# Patient Record
Sex: Female | Born: 1950 | Race: Black or African American | Hispanic: No | Smoking: Current every day smoker
Health system: Southern US, Community
[De-identification: ages and names within clinical notes are randomized; demographics above are authoritative.]

## PROBLEM LIST (undated history)

## (undated) DIAGNOSIS — F419 Anxiety disorder, unspecified: Secondary | ICD-10-CM

## (undated) DIAGNOSIS — M6740 Ganglion, unspecified site: Secondary | ICD-10-CM

## (undated) DIAGNOSIS — I509 Heart failure, unspecified: Secondary | ICD-10-CM

## (undated) DIAGNOSIS — K649 Unspecified hemorrhoids: Secondary | ICD-10-CM

## (undated) DIAGNOSIS — D571 Sickle-cell disease without crisis: Secondary | ICD-10-CM

## (undated) DIAGNOSIS — M1990 Unspecified osteoarthritis, unspecified site: Secondary | ICD-10-CM

## (undated) DIAGNOSIS — F329 Major depressive disorder, single episode, unspecified: Secondary | ICD-10-CM

## (undated) DIAGNOSIS — M549 Dorsalgia, unspecified: Secondary | ICD-10-CM

## (undated) DIAGNOSIS — Z9289 Personal history of other medical treatment: Secondary | ICD-10-CM

## (undated) DIAGNOSIS — D126 Benign neoplasm of colon, unspecified: Secondary | ICD-10-CM

## (undated) DIAGNOSIS — I10 Essential (primary) hypertension: Secondary | ICD-10-CM

## (undated) DIAGNOSIS — I2510 Atherosclerotic heart disease of native coronary artery without angina pectoris: Secondary | ICD-10-CM

## (undated) DIAGNOSIS — K08199 Complete loss of teeth due to other specified cause, unspecified class: Secondary | ICD-10-CM

## (undated) DIAGNOSIS — E785 Hyperlipidemia, unspecified: Secondary | ICD-10-CM

## (undated) DIAGNOSIS — Z973 Presence of spectacles and contact lenses: Secondary | ICD-10-CM

## (undated) DIAGNOSIS — F32A Depression, unspecified: Secondary | ICD-10-CM

## (undated) DIAGNOSIS — J9819 Other pulmonary collapse: Secondary | ICD-10-CM

## (undated) DIAGNOSIS — D369 Benign neoplasm, unspecified site: Secondary | ICD-10-CM

## (undated) DIAGNOSIS — H269 Unspecified cataract: Secondary | ICD-10-CM

## (undated) HISTORY — DX: Unspecified hemorrhoids: K64.9

## (undated) HISTORY — PX: OTHER SURGICAL HISTORY: SHX169

## (undated) HISTORY — DX: Benign neoplasm, unspecified site: D36.9

## (undated) HISTORY — DX: Depression, unspecified: F32.A

## (undated) HISTORY — DX: Major depressive disorder, single episode, unspecified: F32.9

## (undated) HISTORY — DX: Sickle-cell disease without crisis: D57.1

## (undated) HISTORY — DX: Unspecified osteoarthritis, unspecified site: M19.90

## (undated) HISTORY — DX: Unspecified cataract: H26.9

## (undated) HISTORY — DX: Hyperlipidemia, unspecified: E78.5

## (undated) HISTORY — DX: Heart failure, unspecified: I50.9

## (undated) HISTORY — DX: Anxiety disorder, unspecified: F41.9

## (undated) HISTORY — PX: BACK SURGERY: SHX140

## (undated) HISTORY — PX: TONSILLECTOMY: SUR1361

## (undated) HISTORY — DX: Benign neoplasm of colon, unspecified: D12.6

## (undated) HISTORY — DX: Essential (primary) hypertension: I10

## (undated) HISTORY — PX: ABDOMINAL HYSTERECTOMY: SHX81

## (undated) HISTORY — PX: LUNG SURGERY: SHX703

---

## 1982-07-12 DIAGNOSIS — J9819 Other pulmonary collapse: Secondary | ICD-10-CM

## 1982-07-12 HISTORY — DX: Other pulmonary collapse: J98.19

## 1985-07-12 HISTORY — PX: COLON SURGERY: SHX602

## 1997-10-09 ENCOUNTER — Encounter: Admission: RE | Admit: 1997-10-09 | Discharge: 1998-01-07 | Payer: Self-pay | Admitting: *Deleted

## 1997-11-01 ENCOUNTER — Encounter: Admission: RE | Admit: 1997-11-01 | Discharge: 1997-11-01 | Payer: Self-pay | Admitting: Family Medicine

## 1997-11-12 ENCOUNTER — Encounter: Admission: RE | Admit: 1997-11-12 | Discharge: 1997-11-12 | Payer: Self-pay | Admitting: Family Medicine

## 1997-11-18 ENCOUNTER — Encounter: Admission: RE | Admit: 1997-11-18 | Discharge: 1997-11-18 | Payer: Self-pay | Admitting: Family Medicine

## 1997-12-12 ENCOUNTER — Ambulatory Visit (HOSPITAL_COMMUNITY): Admission: RE | Admit: 1997-12-12 | Discharge: 1997-12-12 | Payer: Self-pay | Admitting: Neurological Surgery

## 1997-12-16 ENCOUNTER — Encounter: Admission: RE | Admit: 1997-12-16 | Discharge: 1997-12-16 | Payer: Self-pay | Admitting: Family Medicine

## 1998-01-15 ENCOUNTER — Encounter: Admission: RE | Admit: 1998-01-15 | Discharge: 1998-01-15 | Payer: Self-pay | Admitting: Family Medicine

## 1998-01-29 ENCOUNTER — Encounter: Admission: RE | Admit: 1998-01-29 | Discharge: 1998-01-29 | Payer: Self-pay | Admitting: Family Medicine

## 1998-02-03 ENCOUNTER — Encounter: Admission: RE | Admit: 1998-02-03 | Discharge: 1998-02-03 | Payer: Self-pay | Admitting: Family Medicine

## 1998-02-06 ENCOUNTER — Inpatient Hospital Stay (HOSPITAL_COMMUNITY): Admission: RE | Admit: 1998-02-06 | Discharge: 1998-02-08 | Payer: Self-pay | Admitting: Neurological Surgery

## 1998-04-24 ENCOUNTER — Encounter: Admission: RE | Admit: 1998-04-24 | Discharge: 1998-07-23 | Payer: Self-pay | Admitting: Neurological Surgery

## 1998-05-30 ENCOUNTER — Encounter: Payer: Self-pay | Admitting: Neurological Surgery

## 1998-05-30 ENCOUNTER — Ambulatory Visit (HOSPITAL_COMMUNITY): Admission: RE | Admit: 1998-05-30 | Discharge: 1998-05-30 | Payer: Self-pay | Admitting: Neurological Surgery

## 1998-06-02 ENCOUNTER — Encounter: Admission: RE | Admit: 1998-06-02 | Discharge: 1998-06-02 | Payer: Self-pay | Admitting: Family Medicine

## 1998-08-13 ENCOUNTER — Encounter: Admission: RE | Admit: 1998-08-13 | Discharge: 1998-08-13 | Payer: Self-pay | Admitting: Family Medicine

## 1998-08-28 ENCOUNTER — Encounter: Admission: RE | Admit: 1998-08-28 | Discharge: 1998-08-28 | Payer: Self-pay | Admitting: Family Medicine

## 1998-09-01 ENCOUNTER — Encounter: Admission: RE | Admit: 1998-09-01 | Discharge: 1998-09-01 | Payer: Self-pay | Admitting: Family Medicine

## 1998-09-05 ENCOUNTER — Ambulatory Visit (HOSPITAL_COMMUNITY): Admission: RE | Admit: 1998-09-05 | Discharge: 1998-09-05 | Payer: Self-pay | Admitting: Family Medicine

## 1998-09-19 ENCOUNTER — Encounter: Admission: RE | Admit: 1998-09-19 | Discharge: 1998-09-19 | Payer: Self-pay | Admitting: Family Medicine

## 1998-10-02 ENCOUNTER — Ambulatory Visit (HOSPITAL_COMMUNITY): Admission: RE | Admit: 1998-10-02 | Discharge: 1998-10-02 | Payer: Self-pay | Admitting: Cardiology

## 1998-10-29 ENCOUNTER — Encounter: Admission: RE | Admit: 1998-10-29 | Discharge: 1998-10-29 | Payer: Self-pay | Admitting: Family Medicine

## 1998-10-29 ENCOUNTER — Emergency Department (HOSPITAL_COMMUNITY): Admission: EM | Admit: 1998-10-29 | Discharge: 1998-10-29 | Payer: Self-pay | Admitting: Emergency Medicine

## 1998-11-03 ENCOUNTER — Ambulatory Visit (HOSPITAL_COMMUNITY): Admission: RE | Admit: 1998-11-03 | Discharge: 1998-11-03 | Payer: Self-pay | Admitting: Surgery

## 1998-12-09 ENCOUNTER — Encounter: Admission: RE | Admit: 1998-12-09 | Discharge: 1998-12-09 | Payer: Self-pay | Admitting: Family Medicine

## 1998-12-30 ENCOUNTER — Encounter: Admission: RE | Admit: 1998-12-30 | Discharge: 1998-12-30 | Payer: Self-pay | Admitting: Family Medicine

## 1999-03-24 ENCOUNTER — Encounter: Admission: RE | Admit: 1999-03-24 | Discharge: 1999-03-24 | Payer: Self-pay | Admitting: Family Medicine

## 1999-03-26 ENCOUNTER — Encounter: Admission: RE | Admit: 1999-03-26 | Discharge: 1999-03-26 | Payer: Self-pay | Admitting: Family Medicine

## 1999-04-02 ENCOUNTER — Encounter: Admission: RE | Admit: 1999-04-02 | Discharge: 1999-04-02 | Payer: Self-pay | Admitting: Family Medicine

## 1999-04-09 ENCOUNTER — Encounter: Admission: RE | Admit: 1999-04-09 | Discharge: 1999-04-09 | Payer: Self-pay | Admitting: Family Medicine

## 1999-04-16 ENCOUNTER — Encounter: Admission: RE | Admit: 1999-04-16 | Discharge: 1999-04-16 | Payer: Self-pay | Admitting: Family Medicine

## 1999-04-20 ENCOUNTER — Encounter: Admission: RE | Admit: 1999-04-20 | Discharge: 1999-04-20 | Payer: Self-pay | Admitting: Family Medicine

## 1999-04-30 ENCOUNTER — Encounter: Admission: RE | Admit: 1999-04-30 | Discharge: 1999-04-30 | Payer: Self-pay | Admitting: Family Medicine

## 1999-05-08 ENCOUNTER — Encounter: Admission: RE | Admit: 1999-05-08 | Discharge: 1999-05-08 | Payer: Self-pay | Admitting: Sports Medicine

## 1999-05-14 ENCOUNTER — Encounter: Admission: RE | Admit: 1999-05-14 | Discharge: 1999-05-14 | Payer: Self-pay | Admitting: Family Medicine

## 1999-05-21 ENCOUNTER — Encounter: Admission: RE | Admit: 1999-05-21 | Discharge: 1999-05-21 | Payer: Self-pay | Admitting: Family Medicine

## 1999-06-23 ENCOUNTER — Encounter: Admission: RE | Admit: 1999-06-23 | Discharge: 1999-06-23 | Payer: Self-pay | Admitting: Sports Medicine

## 1999-06-25 ENCOUNTER — Encounter: Admission: RE | Admit: 1999-06-25 | Discharge: 1999-06-25 | Payer: Self-pay | Admitting: Family Medicine

## 1999-07-02 ENCOUNTER — Encounter: Admission: RE | Admit: 1999-07-02 | Discharge: 1999-07-02 | Payer: Self-pay | Admitting: Family Medicine

## 1999-07-16 ENCOUNTER — Encounter: Admission: RE | Admit: 1999-07-16 | Discharge: 1999-07-16 | Payer: Self-pay | Admitting: Family Medicine

## 1999-07-30 ENCOUNTER — Encounter: Admission: RE | Admit: 1999-07-30 | Discharge: 1999-07-30 | Payer: Self-pay | Admitting: Family Medicine

## 1999-08-06 ENCOUNTER — Encounter: Admission: RE | Admit: 1999-08-06 | Discharge: 1999-08-06 | Payer: Self-pay | Admitting: Family Medicine

## 1999-08-13 ENCOUNTER — Encounter: Admission: RE | Admit: 1999-08-13 | Discharge: 1999-08-13 | Payer: Self-pay | Admitting: Family Medicine

## 1999-08-19 ENCOUNTER — Encounter: Admission: RE | Admit: 1999-08-19 | Discharge: 1999-08-19 | Payer: Self-pay | Admitting: Family Medicine

## 1999-08-20 ENCOUNTER — Encounter: Admission: RE | Admit: 1999-08-20 | Discharge: 1999-08-20 | Payer: Self-pay | Admitting: Family Medicine

## 1999-08-24 ENCOUNTER — Encounter: Admission: RE | Admit: 1999-08-24 | Discharge: 1999-08-24 | Payer: Self-pay | Admitting: Family Medicine

## 1999-08-27 ENCOUNTER — Encounter: Admission: RE | Admit: 1999-08-27 | Discharge: 1999-08-27 | Payer: Self-pay | Admitting: Family Medicine

## 1999-09-23 ENCOUNTER — Encounter: Admission: RE | Admit: 1999-09-23 | Discharge: 1999-09-23 | Payer: Self-pay | Admitting: Family Medicine

## 1999-09-24 ENCOUNTER — Encounter: Admission: RE | Admit: 1999-09-24 | Discharge: 1999-09-24 | Payer: Self-pay | Admitting: Family Medicine

## 1999-10-08 ENCOUNTER — Encounter: Admission: RE | Admit: 1999-10-08 | Discharge: 1999-10-08 | Payer: Self-pay | Admitting: Family Medicine

## 1999-10-28 ENCOUNTER — Encounter: Admission: RE | Admit: 1999-10-28 | Discharge: 1999-10-28 | Payer: Self-pay | Admitting: Family Medicine

## 1999-10-29 ENCOUNTER — Encounter: Admission: RE | Admit: 1999-10-29 | Discharge: 1999-10-29 | Payer: Self-pay | Admitting: Family Medicine

## 1999-11-09 ENCOUNTER — Encounter: Admission: RE | Admit: 1999-11-09 | Discharge: 1999-11-09 | Payer: Self-pay | Admitting: Family Medicine

## 1999-11-12 ENCOUNTER — Encounter: Admission: RE | Admit: 1999-11-12 | Discharge: 1999-11-12 | Payer: Self-pay | Admitting: Family Medicine

## 1999-11-26 ENCOUNTER — Encounter: Admission: RE | Admit: 1999-11-26 | Discharge: 1999-11-26 | Payer: Self-pay | Admitting: Family Medicine

## 1999-12-10 ENCOUNTER — Encounter: Admission: RE | Admit: 1999-12-10 | Discharge: 1999-12-10 | Payer: Self-pay | Admitting: Family Medicine

## 1999-12-22 ENCOUNTER — Encounter: Admission: RE | Admit: 1999-12-22 | Discharge: 1999-12-22 | Payer: Self-pay | Admitting: Sports Medicine

## 2000-07-13 ENCOUNTER — Encounter: Admission: RE | Admit: 2000-07-13 | Discharge: 2000-07-13 | Payer: Self-pay | Admitting: Family Medicine

## 2000-07-27 ENCOUNTER — Encounter: Admission: RE | Admit: 2000-07-27 | Discharge: 2000-07-27 | Payer: Self-pay | Admitting: Family Medicine

## 2000-08-11 ENCOUNTER — Encounter: Admission: RE | Admit: 2000-08-11 | Discharge: 2000-08-11 | Payer: Self-pay | Admitting: Family Medicine

## 2000-08-16 ENCOUNTER — Encounter: Payer: Self-pay | Admitting: Emergency Medicine

## 2000-08-16 ENCOUNTER — Emergency Department (HOSPITAL_COMMUNITY): Admission: EM | Admit: 2000-08-16 | Discharge: 2000-08-16 | Payer: Self-pay | Admitting: Emergency Medicine

## 2000-08-30 ENCOUNTER — Encounter: Admission: RE | Admit: 2000-08-30 | Discharge: 2000-08-30 | Payer: Self-pay | Admitting: Family Medicine

## 2001-01-09 ENCOUNTER — Encounter: Admission: RE | Admit: 2001-01-09 | Discharge: 2001-01-09 | Payer: Self-pay | Admitting: Family Medicine

## 2001-02-09 ENCOUNTER — Encounter: Admission: RE | Admit: 2001-02-09 | Discharge: 2001-02-09 | Payer: Self-pay | Admitting: Sports Medicine

## 2001-02-09 ENCOUNTER — Encounter: Payer: Self-pay | Admitting: Sports Medicine

## 2001-02-09 ENCOUNTER — Encounter (INDEPENDENT_AMBULATORY_CARE_PROVIDER_SITE_OTHER): Payer: Self-pay | Admitting: *Deleted

## 2001-02-09 LAB — CONVERTED CEMR LAB

## 2001-02-13 ENCOUNTER — Encounter: Admission: RE | Admit: 2001-02-13 | Discharge: 2001-02-13 | Payer: Self-pay | Admitting: Sports Medicine

## 2001-02-24 ENCOUNTER — Encounter: Admission: RE | Admit: 2001-02-24 | Discharge: 2001-02-24 | Payer: Self-pay | Admitting: Family Medicine

## 2001-07-18 ENCOUNTER — Encounter: Admission: RE | Admit: 2001-07-18 | Discharge: 2001-07-18 | Payer: Self-pay | Admitting: Family Medicine

## 2002-02-14 ENCOUNTER — Encounter: Admission: RE | Admit: 2002-02-14 | Discharge: 2002-02-14 | Payer: Self-pay | Admitting: Family Medicine

## 2002-05-01 ENCOUNTER — Encounter: Admission: RE | Admit: 2002-05-01 | Discharge: 2002-05-01 | Payer: Self-pay | Admitting: Family Medicine

## 2002-05-10 ENCOUNTER — Encounter: Payer: Self-pay | Admitting: Sports Medicine

## 2002-05-10 ENCOUNTER — Encounter: Admission: RE | Admit: 2002-05-10 | Discharge: 2002-05-10 | Payer: Self-pay | Admitting: Sports Medicine

## 2002-09-19 ENCOUNTER — Encounter: Admission: RE | Admit: 2002-09-19 | Discharge: 2002-09-19 | Payer: Self-pay | Admitting: Family Medicine

## 2003-03-04 ENCOUNTER — Encounter: Admission: RE | Admit: 2003-03-04 | Discharge: 2003-03-04 | Payer: Self-pay | Admitting: Sports Medicine

## 2003-03-20 ENCOUNTER — Ambulatory Visit (HOSPITAL_COMMUNITY): Admission: RE | Admit: 2003-03-20 | Discharge: 2003-03-20 | Payer: Self-pay | Admitting: Neurological Surgery

## 2003-03-20 ENCOUNTER — Encounter: Payer: Self-pay | Admitting: Neurological Surgery

## 2003-05-08 ENCOUNTER — Encounter: Admission: RE | Admit: 2003-05-08 | Discharge: 2003-05-08 | Payer: Self-pay | Admitting: Family Medicine

## 2003-07-13 HISTORY — PX: CORONARY ANGIOPLASTY: SHX604

## 2003-09-19 ENCOUNTER — Inpatient Hospital Stay (HOSPITAL_COMMUNITY): Admission: EM | Admit: 2003-09-19 | Discharge: 2003-09-21 | Payer: Self-pay | Admitting: Emergency Medicine

## 2003-09-20 ENCOUNTER — Encounter (INDEPENDENT_AMBULATORY_CARE_PROVIDER_SITE_OTHER): Payer: Self-pay | Admitting: Cardiology

## 2003-09-25 ENCOUNTER — Encounter: Admission: RE | Admit: 2003-09-25 | Discharge: 2003-09-25 | Payer: Self-pay | Admitting: Family Medicine

## 2003-11-15 ENCOUNTER — Encounter: Admission: RE | Admit: 2003-11-15 | Discharge: 2003-11-15 | Payer: Self-pay | Admitting: Family Medicine

## 2003-11-18 ENCOUNTER — Encounter: Admission: RE | Admit: 2003-11-18 | Discharge: 2003-11-18 | Payer: Self-pay | Admitting: Family Medicine

## 2003-12-03 ENCOUNTER — Encounter: Admission: RE | Admit: 2003-12-03 | Discharge: 2003-12-03 | Payer: Self-pay | Admitting: Sports Medicine

## 2003-12-11 ENCOUNTER — Encounter: Admission: RE | Admit: 2003-12-11 | Discharge: 2003-12-11 | Payer: Self-pay | Admitting: Sports Medicine

## 2004-05-13 ENCOUNTER — Ambulatory Visit: Payer: Self-pay | Admitting: Family Medicine

## 2004-05-19 ENCOUNTER — Emergency Department (HOSPITAL_COMMUNITY): Admission: EM | Admit: 2004-05-19 | Discharge: 2004-05-20 | Payer: Self-pay | Admitting: Emergency Medicine

## 2004-05-19 ENCOUNTER — Ambulatory Visit: Payer: Self-pay | Admitting: Family Medicine

## 2004-06-18 ENCOUNTER — Ambulatory Visit (HOSPITAL_COMMUNITY): Admission: RE | Admit: 2004-06-18 | Discharge: 2004-06-18 | Payer: Self-pay | Admitting: Surgery

## 2004-12-03 ENCOUNTER — Encounter: Admission: RE | Admit: 2004-12-03 | Discharge: 2004-12-03 | Payer: Self-pay | Admitting: Sports Medicine

## 2005-04-21 ENCOUNTER — Ambulatory Visit: Payer: Self-pay | Admitting: Family Medicine

## 2005-08-25 ENCOUNTER — Ambulatory Visit: Payer: Self-pay | Admitting: Family Medicine

## 2005-08-25 ENCOUNTER — Inpatient Hospital Stay (HOSPITAL_COMMUNITY): Admission: EM | Admit: 2005-08-25 | Discharge: 2005-08-27 | Payer: Self-pay | Admitting: Emergency Medicine

## 2005-09-02 ENCOUNTER — Ambulatory Visit: Payer: Self-pay | Admitting: Family Medicine

## 2005-11-03 ENCOUNTER — Ambulatory Visit: Payer: Self-pay | Admitting: Family Medicine

## 2006-01-11 ENCOUNTER — Encounter: Admission: RE | Admit: 2006-01-11 | Discharge: 2006-01-11 | Payer: Self-pay | Admitting: Sports Medicine

## 2006-01-21 ENCOUNTER — Encounter: Admission: RE | Admit: 2006-01-21 | Discharge: 2006-01-21 | Payer: Self-pay | Admitting: Sports Medicine

## 2006-02-16 ENCOUNTER — Ambulatory Visit: Payer: Self-pay | Admitting: Family Medicine

## 2006-02-28 ENCOUNTER — Ambulatory Visit: Payer: Self-pay | Admitting: Family Medicine

## 2006-04-20 ENCOUNTER — Ambulatory Visit: Payer: Self-pay | Admitting: Family Medicine

## 2006-05-25 ENCOUNTER — Ambulatory Visit: Payer: Self-pay | Admitting: Sports Medicine

## 2006-05-27 ENCOUNTER — Ambulatory Visit: Payer: Self-pay | Admitting: Family Medicine

## 2006-06-21 ENCOUNTER — Ambulatory Visit: Payer: Self-pay | Admitting: Sports Medicine

## 2006-07-15 ENCOUNTER — Encounter (INDEPENDENT_AMBULATORY_CARE_PROVIDER_SITE_OTHER): Payer: Self-pay | Admitting: Family Medicine

## 2006-07-15 ENCOUNTER — Ambulatory Visit: Payer: Self-pay | Admitting: Family Medicine

## 2006-07-15 LAB — CONVERTED CEMR LAB
HDL: 45 mg/dL (ref >39)
LDL Cholesterol: 155 mg/dL — ABNORMAL HIGH (ref 0–99)
Total CHOL/HDL Ratio: 5.6
Triglycerides: 267 mg/dL — ABNORMAL HIGH (ref <150)
VLDL: 53 mg/dL — ABNORMAL HIGH (ref 0–40)

## 2006-07-25 ENCOUNTER — Ambulatory Visit: Payer: Self-pay | Admitting: Family Medicine

## 2006-09-08 DIAGNOSIS — I2510 Atherosclerotic heart disease of native coronary artery without angina pectoris: Secondary | ICD-10-CM | POA: Insufficient documentation

## 2006-09-08 DIAGNOSIS — F3289 Other specified depressive episodes: Secondary | ICD-10-CM | POA: Insufficient documentation

## 2006-09-08 DIAGNOSIS — I10 Essential (primary) hypertension: Secondary | ICD-10-CM

## 2006-09-08 DIAGNOSIS — E785 Hyperlipidemia, unspecified: Secondary | ICD-10-CM | POA: Insufficient documentation

## 2006-09-08 DIAGNOSIS — I5022 Chronic systolic (congestive) heart failure: Secondary | ICD-10-CM

## 2006-09-08 DIAGNOSIS — I2589 Other forms of chronic ischemic heart disease: Secondary | ICD-10-CM

## 2006-09-08 DIAGNOSIS — F329 Major depressive disorder, single episode, unspecified: Secondary | ICD-10-CM

## 2006-09-08 DIAGNOSIS — F411 Generalized anxiety disorder: Secondary | ICD-10-CM

## 2006-09-08 HISTORY — DX: Generalized anxiety disorder: F41.1

## 2006-09-09 ENCOUNTER — Encounter (INDEPENDENT_AMBULATORY_CARE_PROVIDER_SITE_OTHER): Payer: Self-pay | Admitting: *Deleted

## 2006-11-15 ENCOUNTER — Telehealth (INDEPENDENT_AMBULATORY_CARE_PROVIDER_SITE_OTHER): Payer: Self-pay | Admitting: Family Medicine

## 2006-11-17 ENCOUNTER — Encounter (INDEPENDENT_AMBULATORY_CARE_PROVIDER_SITE_OTHER): Payer: Self-pay | Admitting: Family Medicine

## 2006-11-17 ENCOUNTER — Ambulatory Visit: Payer: Self-pay | Admitting: Family Medicine

## 2006-11-17 DIAGNOSIS — Z860100 Personal history of colon polyps, unspecified: Secondary | ICD-10-CM | POA: Insufficient documentation

## 2006-11-17 LAB — CONVERTED CEMR LAB
ALT: 21 units/L (ref 0–35)
AST: 16 units/L (ref 0–37)
Albumin: 4.4 g/dL (ref 3.5–5.2)
CO2: 25 meq/L (ref 19–32)
Chloride: 103 meq/L (ref 96–112)
Creatinine, Ser: 1.22 mg/dL — ABNORMAL HIGH (ref 0.40–1.20)
Glucose, Bld: 89 mg/dL (ref 70–99)
HCT: 36.8 %
Hemoglobin: 12.6 g/dL
Total Bilirubin: 0.4 mg/dL (ref 0.3–1.2)
Total Protein: 7.2 g/dL (ref 6.0–8.3)

## 2007-01-31 ENCOUNTER — Encounter: Admission: RE | Admit: 2007-01-31 | Discharge: 2007-01-31 | Payer: Self-pay | Admitting: Sports Medicine

## 2007-04-03 ENCOUNTER — Telehealth (INDEPENDENT_AMBULATORY_CARE_PROVIDER_SITE_OTHER): Payer: Self-pay | Admitting: *Deleted

## 2007-04-04 ENCOUNTER — Encounter: Payer: Self-pay | Admitting: *Deleted

## 2007-04-04 ENCOUNTER — Ambulatory Visit: Payer: Self-pay | Admitting: Family Medicine

## 2007-04-04 ENCOUNTER — Encounter (INDEPENDENT_AMBULATORY_CARE_PROVIDER_SITE_OTHER): Payer: Self-pay | Admitting: Family Medicine

## 2007-04-04 DIAGNOSIS — Z87891 Personal history of nicotine dependence: Secondary | ICD-10-CM | POA: Insufficient documentation

## 2007-04-04 DIAGNOSIS — F17200 Nicotine dependence, unspecified, uncomplicated: Secondary | ICD-10-CM

## 2007-04-04 LAB — CONVERTED CEMR LAB
Cholesterol, target level: 200 mg/dL
HDL goal, serum: 40 mg/dL

## 2007-04-05 ENCOUNTER — Encounter (INDEPENDENT_AMBULATORY_CARE_PROVIDER_SITE_OTHER): Payer: Self-pay | Admitting: Family Medicine

## 2007-04-05 LAB — CONVERTED CEMR LAB
ALT: 24 units/L (ref 0–35)
Albumin: 4.7 g/dL (ref 3.5–5.2)
BUN: 12 mg/dL (ref 6–23)
CO2: 23 meq/L (ref 19–32)
Calcium: 10.1 mg/dL (ref 8.4–10.5)
Glucose, Bld: 95 mg/dL (ref 70–99)
HCT: 41.3 % (ref 36.0–46.0)
MCHC: 32.9 g/dL (ref 30.0–36.0)
MCV: 84.6 fL (ref 78.0–100.0)
Platelets: 298 10*3/uL (ref 150–400)
Potassium: 4.1 meq/L (ref 3.5–5.3)
RDW: 14.2 % — ABNORMAL HIGH (ref 11.5–14.0)
Total Protein: 7.7 g/dL (ref 6.0–8.3)

## 2007-05-03 ENCOUNTER — Ambulatory Visit: Payer: Self-pay | Admitting: Family Medicine

## 2007-06-03 ENCOUNTER — Encounter: Payer: Self-pay | Admitting: Family Medicine

## 2007-06-03 ENCOUNTER — Ambulatory Visit: Payer: Self-pay | Admitting: Family Medicine

## 2007-06-03 ENCOUNTER — Emergency Department (HOSPITAL_COMMUNITY): Admission: EM | Admit: 2007-06-03 | Discharge: 2007-06-03 | Payer: Self-pay | Admitting: Emergency Medicine

## 2007-06-05 ENCOUNTER — Telehealth: Payer: Self-pay | Admitting: *Deleted

## 2007-06-06 ENCOUNTER — Encounter (INDEPENDENT_AMBULATORY_CARE_PROVIDER_SITE_OTHER): Payer: Self-pay | Admitting: Family Medicine

## 2007-06-06 ENCOUNTER — Ambulatory Visit: Payer: Self-pay | Admitting: Family Medicine

## 2007-06-07 LAB — CONVERTED CEMR LAB
Calcium: 9.3 mg/dL (ref 8.4–10.5)
Glucose, Bld: 85 mg/dL (ref 70–99)
Sodium: 141 meq/L (ref 135–145)

## 2007-07-27 ENCOUNTER — Ambulatory Visit: Payer: Self-pay | Admitting: Family Medicine

## 2007-07-27 ENCOUNTER — Encounter: Payer: Self-pay | Admitting: Family Medicine

## 2007-07-27 LAB — CONVERTED CEMR LAB
AST: 17 units/L (ref 0–37)
BUN: 13 mg/dL (ref 6–23)
Calcium: 9.9 mg/dL (ref 8.4–10.5)
Creatinine, Ser: 1.12 mg/dL (ref 0.40–1.20)
Glucose, Bld: 95 mg/dL (ref 70–99)
LDL Cholesterol: 96 mg/dL (ref 0–99)
LDL Goal: 70 mg/dL
Potassium: 4.1 meq/L (ref 3.5–5.3)
Sodium: 139 meq/L (ref 135–145)
Total Bilirubin: 0.5 mg/dL (ref 0.3–1.2)
Total Protein: 8 g/dL (ref 6.0–8.3)

## 2007-10-10 ENCOUNTER — Encounter: Payer: Self-pay | Admitting: Family Medicine

## 2007-10-10 ENCOUNTER — Ambulatory Visit: Payer: Self-pay | Admitting: Family Medicine

## 2007-10-10 LAB — CONVERTED CEMR LAB: Direct LDL: 90 mg/dL

## 2007-11-09 ENCOUNTER — Telehealth: Payer: Self-pay | Admitting: Family Medicine

## 2007-11-13 ENCOUNTER — Telehealth: Payer: Self-pay | Admitting: Family Medicine

## 2007-12-08 ENCOUNTER — Encounter: Payer: Self-pay | Admitting: Family Medicine

## 2007-12-08 ENCOUNTER — Ambulatory Visit: Payer: Self-pay | Admitting: Family Medicine

## 2007-12-26 ENCOUNTER — Encounter: Payer: Self-pay | Admitting: Family Medicine

## 2008-01-08 ENCOUNTER — Encounter: Payer: Self-pay | Admitting: Family Medicine

## 2008-01-11 ENCOUNTER — Encounter: Payer: Self-pay | Admitting: Family Medicine

## 2008-02-02 ENCOUNTER — Encounter: Admission: RE | Admit: 2008-02-02 | Discharge: 2008-02-02 | Payer: Self-pay | Admitting: Family Medicine

## 2008-03-07 ENCOUNTER — Encounter: Payer: Self-pay | Admitting: Family Medicine

## 2008-03-13 ENCOUNTER — Encounter: Admission: RE | Admit: 2008-03-13 | Discharge: 2008-03-13 | Payer: Self-pay | Admitting: Family Medicine

## 2008-03-26 ENCOUNTER — Ambulatory Visit: Payer: Self-pay | Admitting: Family Medicine

## 2008-04-17 ENCOUNTER — Ambulatory Visit: Payer: Self-pay | Admitting: Family Medicine

## 2008-04-17 ENCOUNTER — Encounter: Payer: Self-pay | Admitting: Family Medicine

## 2008-05-10 IMAGING — MG MM DIAGNOSTIC LTD RIGHT
8 of 10 series · 8 of 10 positions shown · non-contrast
Comparison: none

[REDACTED] RIGHT
CC and MLO view(s) were taken of the right breast.

RIGHT BREAST ULTRASOUND
DIGITAL LIMITED RIGHT DIAGNOSTIC MAMMOGRAM WITH CAD AND RIGHT BREAST ULTRASOUND:
CLINICAL DATA: The patient returns after screening study on 01-11-06 for further evaluation of the 
right breast.

[R CC (1 of 3)]
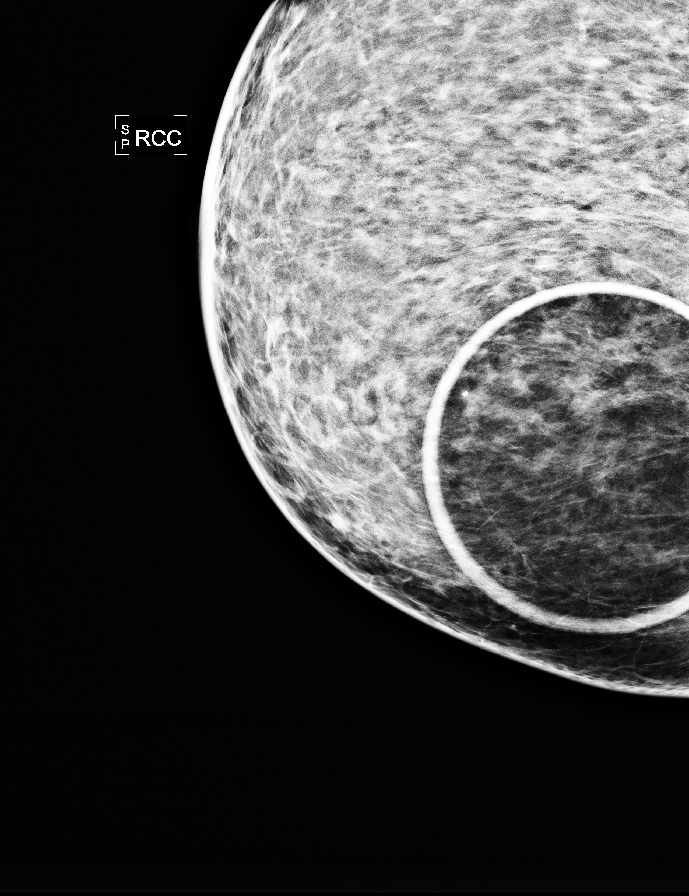

[R RM (1 of 2)]
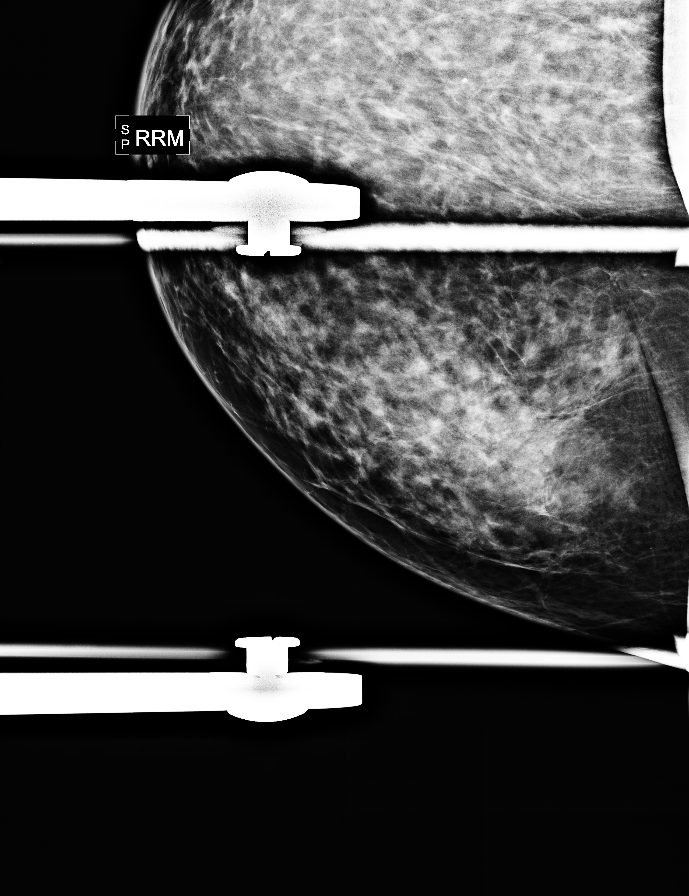

[R CC (2 of 3)]
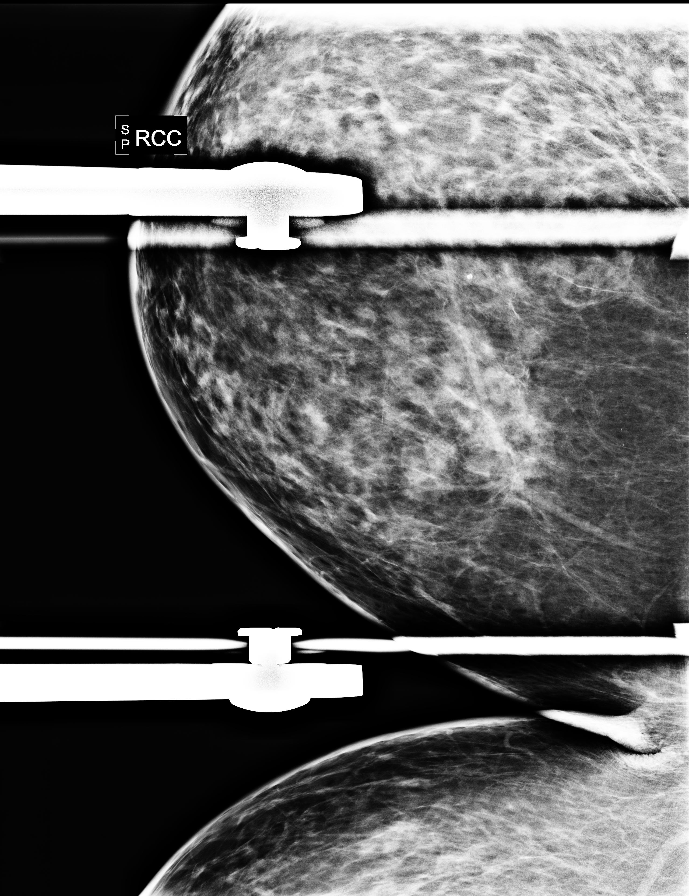

[R MLO]
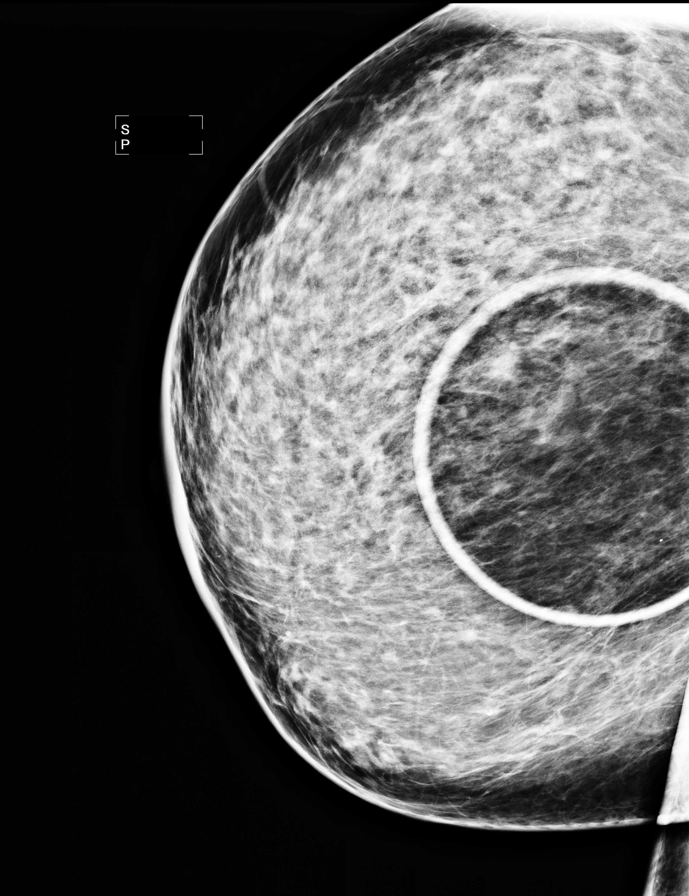

[R CC (3 of 3)]
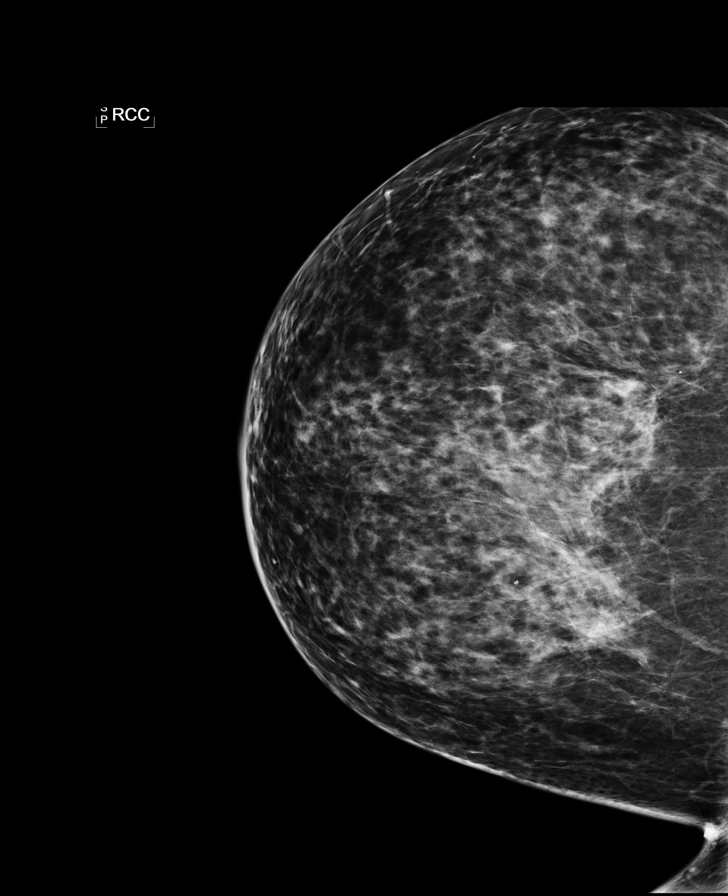

[R right lateral]
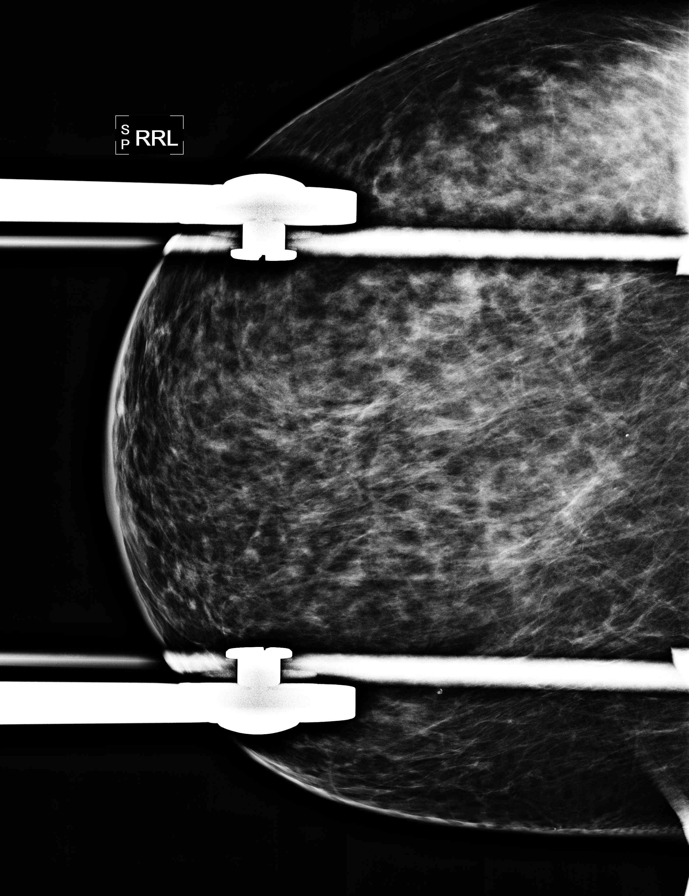

[R ML]
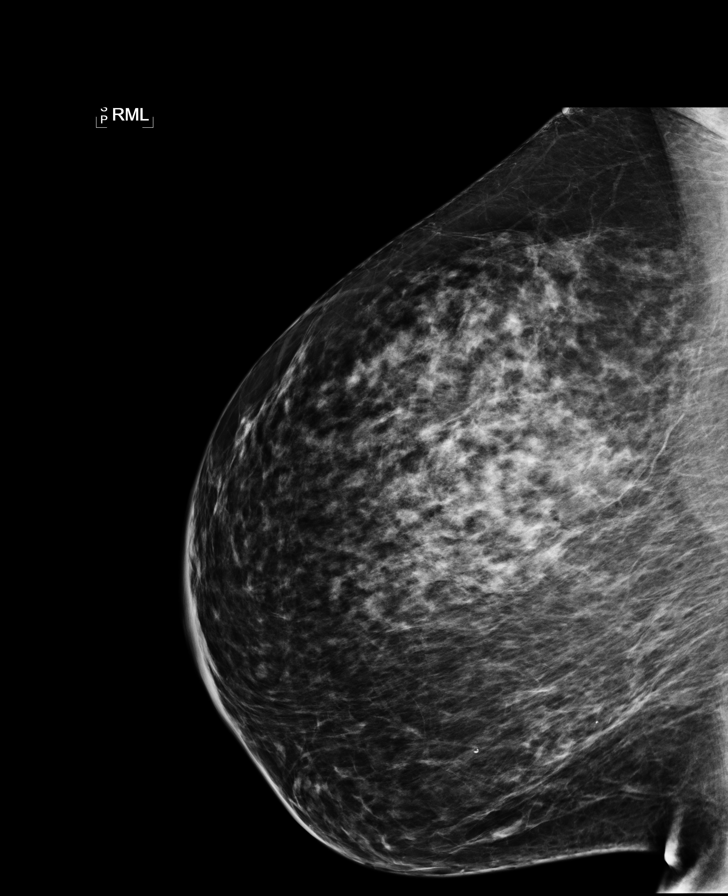

[R RM (2 of 2)]
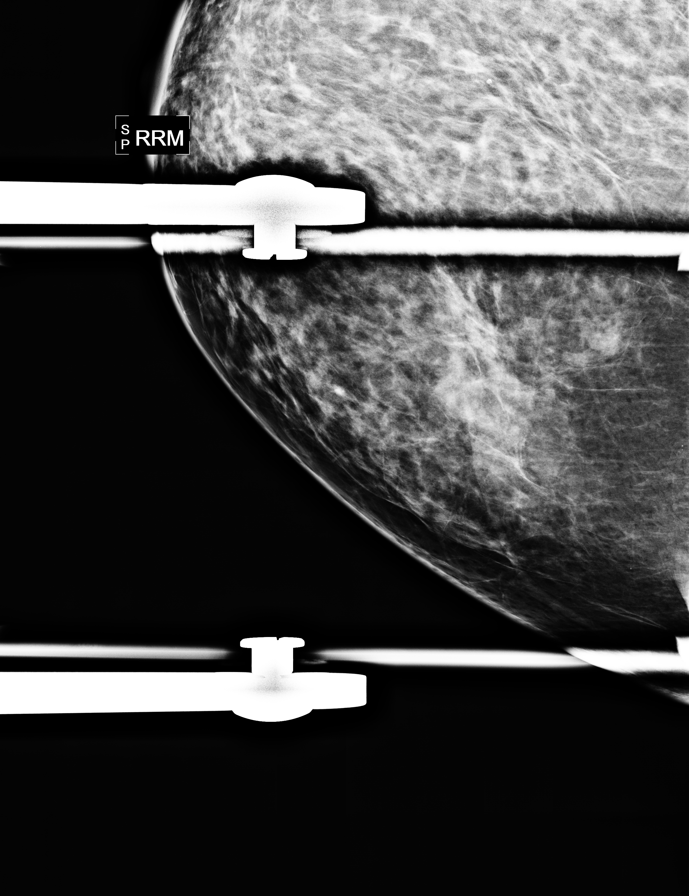

[8 of 10 positions shown; findings below may reference images not displayed]

Spot and rolled views are performed of the right breast, showing asymmetric density in the medial 
portion of the right breast.  However, this density disperses on rolled views and oblique or true 
lateral views.  Comparison to multiple prior exams including 12-03-04, 12-03-03, 12-11-03, 05-10-02, 
and 10-10-98.

On physical exam, I do not palpate an abnormality in the medial portion of the right breast.  
Ultrasound is performed in the medial quadrants showing normal-appearing fibroglandular parenchyma 
without mass or acoustic shadowing.
IMPRESSION: Asymmetric density is consistent with focal fibroglandular tissue.  No evidence for malignancy.  
Annual mammography is recommended.

ASSESSMENT: Negative - BI-RADS 1

Screening mammogram of both breasts in 1 year.
ANALYZED BY COMPUTER AIDED DETECTION. ,

## 2008-07-08 ENCOUNTER — Telehealth: Payer: Self-pay | Admitting: *Deleted

## 2008-07-23 ENCOUNTER — Ambulatory Visit: Payer: Self-pay | Admitting: Family Medicine

## 2008-10-15 ENCOUNTER — Ambulatory Visit: Payer: Self-pay | Admitting: Family Medicine

## 2008-10-15 ENCOUNTER — Encounter: Payer: Self-pay | Admitting: Family Medicine

## 2008-10-15 LAB — CONVERTED CEMR LAB
Albumin: 4.9 g/dL (ref 3.5–5.2)
BUN: 12 mg/dL (ref 6–23)
CO2: 25 meq/L (ref 19–32)
Creatinine, Ser: 1.15 mg/dL (ref 0.40–1.20)
Glucose, Bld: 105 mg/dL — ABNORMAL HIGH (ref 70–99)
HDL: 50 mg/dL (ref >39)
LDL Cholesterol: 95 mg/dL (ref 0–99)
Potassium: 5.2 meq/L (ref 3.5–5.3)
Sodium: 140 meq/L (ref 135–145)
Triglycerides: 182 mg/dL — ABNORMAL HIGH (ref <150)

## 2009-01-15 ENCOUNTER — Ambulatory Visit: Payer: Self-pay | Admitting: Family Medicine

## 2009-01-15 ENCOUNTER — Telehealth: Payer: Self-pay | Admitting: Family Medicine

## 2009-02-05 ENCOUNTER — Encounter: Admission: RE | Admit: 2009-02-05 | Discharge: 2009-02-05 | Payer: Self-pay | Admitting: Family Medicine

## 2009-02-10 ENCOUNTER — Ambulatory Visit: Payer: Self-pay | Admitting: Family Medicine

## 2009-03-25 ENCOUNTER — Encounter: Payer: Self-pay | Admitting: Family Medicine

## 2009-07-15 ENCOUNTER — Encounter: Payer: Self-pay | Admitting: Family Medicine

## 2009-07-16 ENCOUNTER — Encounter: Payer: Self-pay | Admitting: Family Medicine

## 2009-08-15 ENCOUNTER — Encounter: Payer: Self-pay | Admitting: Family Medicine

## 2009-08-15 ENCOUNTER — Ambulatory Visit: Payer: Self-pay | Admitting: Family Medicine

## 2009-08-16 LAB — CONVERTED CEMR LAB
CO2: 26 meq/L (ref 19–32)
Creatinine, Ser: 1.1 mg/dL (ref 0.40–1.20)
Potassium: 4.2 meq/L (ref 3.5–5.3)
Sodium: 139 meq/L (ref 135–145)

## 2009-08-25 ENCOUNTER — Encounter: Payer: Self-pay | Admitting: Family Medicine

## 2009-12-04 ENCOUNTER — Encounter: Payer: Self-pay | Admitting: Family Medicine

## 2009-12-04 DIAGNOSIS — IMO0002 Reserved for concepts with insufficient information to code with codable children: Secondary | ICD-10-CM | POA: Insufficient documentation

## 2009-12-05 ENCOUNTER — Encounter: Payer: Self-pay | Admitting: Family Medicine

## 2009-12-11 ENCOUNTER — Ambulatory Visit: Payer: Self-pay | Admitting: Family Medicine

## 2009-12-11 ENCOUNTER — Encounter: Payer: Self-pay | Admitting: Family Medicine

## 2009-12-26 LAB — CONVERTED CEMR LAB
AST: 18 units/L (ref 0–37)
Albumin: 4.8 g/dL (ref 3.5–5.2)
BUN: 18 mg/dL (ref 6–23)
CO2: 24 meq/L (ref 19–32)
Calcium: 10.2 mg/dL (ref 8.4–10.5)
Creatinine, Ser: 1.17 mg/dL (ref 0.40–1.20)
HDL: 46 mg/dL (ref >39)
LDL Cholesterol: 176 mg/dL — ABNORMAL HIGH (ref 0–99)
Sodium: 138 meq/L (ref 135–145)
Total Bilirubin: 0.4 mg/dL (ref 0.3–1.2)
Total CHOL/HDL Ratio: 5.9
Triglycerides: 239 mg/dL — ABNORMAL HIGH (ref <150)

## 2010-01-23 ENCOUNTER — Ambulatory Visit: Payer: Self-pay | Admitting: Family Medicine

## 2010-02-09 ENCOUNTER — Encounter: Admission: RE | Admit: 2010-02-09 | Discharge: 2010-02-09 | Payer: Self-pay | Admitting: Family Medicine

## 2010-05-01 ENCOUNTER — Encounter: Payer: Self-pay | Admitting: Family Medicine

## 2010-07-01 ENCOUNTER — Encounter: Payer: Self-pay | Admitting: Family Medicine

## 2010-07-01 ENCOUNTER — Ambulatory Visit: Payer: Self-pay | Admitting: Family Medicine

## 2010-07-01 LAB — CONVERTED CEMR LAB
CO2: 27 meq/L (ref 19–32)
Direct LDL: 167 mg/dL — ABNORMAL HIGH
Sodium: 141 meq/L (ref 135–145)
Total Protein: 6.9 g/dL (ref 6.0–8.3)

## 2010-07-07 ENCOUNTER — Encounter: Payer: Self-pay | Admitting: Family Medicine

## 2010-07-14 ENCOUNTER — Encounter: Payer: Self-pay | Admitting: Family Medicine

## 2010-08-02 ENCOUNTER — Encounter: Payer: Self-pay | Admitting: Sports Medicine

## 2010-08-11 NOTE — Assessment & Plan Note (Signed)
Summary: f/u,df   Vital Signs:  Patient profile:   60 year old female Height:      69 inches Weight:      205 pounds BMI:     30.38 Temp:     98.2 degrees F Pulse rate:   85 / minute BP sitting:   123 / 79  Vitals Entered By: Golden Circle RN (August 15, 2009 8:45 AM)   Primary Care Provider:  Bobby Rumpf  MD   History of Present Illness: 1) HTN - Denies chest pain, dyspnea, LE edema, headache. Occasionally checks pressures at pharmacy - 140's / 70's. No side effects from medications. On Bidil, spironolactone, lasix, norvasc, lisinopril, metoprolol. Does not monitor salt. Walks 30 minutes every day. Weight stable.   2) Tobacco abuse - down to 1 cigarette per day by gradually cutting back. Uses SABA once a day before bed. Denies worsening dyspnea, cough, wheeze, fever, hemoptysis, chest pain. Contemplative  about quitting al the way. Has quit before for a max of 5 months, but started up again when she was around smokers.   3) Depression: Mood = "Great ever since you all started me on that medicine". She states that family has noted a change; she is less irritable and sad. Denies SI/HI, or side effects of Paxil.  4) HLD: Last lipid panel in April 2010 - trig 182, total 181, HDL 50, LDL 95. Exercise as above. Decreased fried foods. On crestor w/o side effects.   Habits & Providers  Alcohol-Tobacco-Diet     Alcohol drinks/day: <1     Alcohol Counseling: not indicated; use of alcohol is not excessive or problematic     Alcohol type: beer     Tobacco Status: current     Tobacco Counseling: to quit use of tobacco products     Cigarette Packs/Day: <0.25     Diet Comments: wt loss  Exercise-Depression-Behavior     Have you felt down or hopeless? no     Have you felt little pleasure in things? no     Depression Counseling: not indicated; screening negative for depression     STD Risk: never     Drug Use: never     Seat Belt Use: always     Sun Exposure: infrequent  Current  Medications (verified): 1)  Aspirin Childrens 81 Mg Chew (Aspirin) .... Take 1 Tablet By Mouth Once A Day 2)  Isosorbide Dinitrate 20 Mg Tabs (Isosorbide Dinitrate) .... Take 1 Tablet By Mouth Twice A Day 3)  Lasix 40 Mg Tabs (Furosemide) .... Take 1/2 Tablet By Mouth Twice A Day 4)  Metoprolol Succinate 25 Mg Tb24 (Metoprolol Succinate) .... Take 1 Tablet By Mouth Twice A Day 5)  Norvasc 5 Mg Tabs (Amlodipine Besylate) .... Take 1 Tablet By Mouth Once A Day 6)  Paroxetine Hcl 20 Mg Tabs (Paroxetine Hcl) .... Take 1 Tablet By Mouth Once A Day 7)  Spironolactone 25 Mg Tabs (Spironolactone) .... Take 1/2 Tablet By Mouth Twice A Day 8)  Nasonex 50 Mcg/act  Susp (Mometasone Furoate) .... 2 Sprays in Each Nostril Daily As Needed Congestion 9)  Proventil Hfa 108 (90 Base) Mcg/act  Aers (Albuterol Sulfate) .... 2 Puffs Inhaled Q 2-4 Hours For Wheezing or Shortness of Breath. Disp 1 Inhaler 10)  Crestor 10 Mg Tabs (Rosuvastatin Calcium) .... Take 1 Tab By Mouth Daily 11)  Lisinopril 20 Mg  Tabs (Lisinopril) .Marland Kitchen.. 1 Tab By Mouth Daily. 12)  Zithromax Z-Pak 250 Mg Tabs (Azithromycin) .Marland KitchenMarland KitchenMarland Kitchen  Use As Directed. 13)  Eucerin  Crea (Skin Protectants, Misc.) .... Apply To Hands and Other Dry Areas of Skin Twice A Day. Disp #1tub  Allergies (verified): No Known Drug Allergies  Social History: STD Risk:  never Drug Use:  never Seat Belt Use:  always Sun Exposure-Excessive:  infrequent  Physical Exam  General:  Well appearing woman in NAD, obese BMI = 30 Neck:  no JVD or bruits  Lungs:  Normal respiratory effort, chest expands symmetrically. Lungs are clear to auscultation, no crackles or wheezes. Heart:  Normal rate and regular rhythm. S1 and S2 normal without gallop, murmur, click, rub or other extra sounds. No JVD.  Pulses:  2+ pedals  Extremities:  no edema  Neurologic:  alert & oriented X3 and cranial nerves II-XII intact.     Impression & Recommendations:  Problem # 1:  HYPERTENSION, BENIGN  SYSTEMIC (ICD-401.1) Assessment Unchanged At goal. Will check BMET today. DASH diet, exercise counselling for 25 minutes.  Her updated medication list for this problem includes:    Lasix 40 Mg Tabs (Furosemide) .Marland Kitchen... Take 1/2 tablet by mouth twice a day    Metoprolol Succinate 25 Mg Tb24 (Metoprolol succinate) .Marland Kitchen... Take 1 tablet by mouth twice a day    Norvasc 5 Mg Tabs (Amlodipine besylate) .Marland Kitchen... Take 1 tablet by mouth once a day    Spironolactone 25 Mg Tabs (Spironolactone) .Marland Kitchen... Take 1/2 tablet by mouth twice a day    Lisinopril 20 Mg Tabs (Lisinopril) .Marland Kitchen... 1 tab by mouth daily.  Orders: Basic Met-[facility] ([address]) [facility]- Est  Level 4 ([address])  Problem # 2:  TOBACCO ABUSE (ICD-305.1) Assessment: Unchanged  Actiely quitting but stuck at 1 cigarette per day. Reviewed healthy distractors from smoking. Advised spacing out cigarettes. Will follow. Quit goal w/in next three months.   Orders: [facility]- Est  Level 4 ([address])  Problem # 3:  HYPERLIPIDEMIA (ICD-272.4) Assessment: Unchanged  Recheck FLP in 3 months, CMET. At goal for HDL, LDL. On Crestor.  Her updated medication list for this problem includes:    Crestor 10 Mg Tabs (Rosuvastatin calcium) .Marland Kitchen... Take 1 tab by mouth daily  Labs Reviewed: SGOT: 17 (10/15/2008)   SGPT: 21 (10/15/2008)  Lipid Goals: Chol Goal: 200 (04/04/2007)   HDL Goal: 40 (04/04/2007)   LDL Goal: 70 (07/27/2007)   TG Goal: 150 (04/04/2007)  Prior 10 Yr Risk Heart Disease: N/A (04/04/2007)   HDL:50 (10/15/2008), 47 (07/27/2007)  LDL:95 (10/15/2008), 96 (07/27/2007)  Chol:181 (10/15/2008), 177 (07/27/2007)  Trig:182 (10/15/2008), 170 (07/27/2007)  Orders: [facility]- Est  Level 4 ([address])  Problem # 4:  DEPRESSIVE DISORDER, NOS (ICD-311) Assessment: Unchanged  Stable. Continue Paxil. Reinforced positive behaviors.  Her updated medication list for this problem includes:    Paroxetine Hcl 20 Mg Tabs (Paroxetine hcl) .Marland Kitchen... Take 1 tablet by mouth once a  day  Orders: [facility]- Est  Level 4 ([address]86)  Complete Medication List: 1)  Aspirin Childrens 81 Mg Chew (Aspirin) .... Take 1 tablet by mouth once a day 2)  Isosorbide Dinitrate 20 Mg Tabs (Isosorbide dinitrate) .... Take 1 tablet by mouth twice a day 3)  Lasix 40 Mg Tabs (Furosemide) .... Take 1/2 tablet by mouth twice a day 4)  Metoprolol Succinate 25 Mg Tb24 (Metoprolol succinate) .... Take 1 tablet by mouth twice a day 5)  Norvasc 5 Mg Tabs (Amlodipine besylate) .... Take 1 tablet by mouth once a day 6)  Paroxetine Hcl 20 Mg Tabs (Paroxetine hcl) .... Take 1 tablet by  mouth once a day 7)  Spironolactone 25 Mg Tabs (Spironolactone) .... Take 1/2 tablet by mouth twice a day 8)  Nasonex 50 Mcg/act Susp (Mometasone furoate) .... 2 sprays in each nostril daily as needed congestion 9)  Proventil Hfa 108 (90 Base) Mcg/act Aers (Albuterol sulfate) .... 2 puffs inhaled q 2-4 hours for wheezing or shortness of breath. disp 1 inhaler 10)  Crestor 10 Mg Tabs (Rosuvastatin calcium) .... Take 1 tab by mouth daily 11)  Lisinopril 20 Mg Tabs (Lisinopril) .Marland Kitchen.. 1 tab by mouth daily. 12)  Zithromax Z-pak 250 Mg Tabs (Azithromycin) .... Use as directed. 13)  Eucerin Crea (Skin protectants, misc.) .... Apply to hands and other dry areas of skin twice a day. disp #1tub  Patient Instructions: 1)  It was great to see you today!  2)  Follow up in three months. 3)  You have done a great job with cutting back on smoking. You are almost there - think of ways to deal with stress that do not involve smoking or eating and put these into place every time you feel like smoking. Try to increase the number of days between cigarettes like we talked about.  4)  Continue all medications as before.  5)  Go to pharmacy once a week and check blood pressures and record numbers.  6)  Keep your salt (sodium) under 2000 mg daily  7)  Use Eucerin cream on hands twice a day to prevent dryness.  Prescriptions: LISINOPRIL 20 MG  TABS  (LISINOPRIL) 1 tab by mouth daily.  #30 Tablet x 5   Entered and Authorized by:   Bobby Rumpf  MD   Signed by:   Bobby Rumpf  MD on 08/15/2009   Method used:   [facility]        [facility] [address] [phone]* (retail)       [address]       [place], [place]  [address]       Ph: [phone] or [phone]       Fax: [phone]   RxID:   [id number] CRESTOR 10 MG TABS (ROSUVASTATIN CALCIUM) Take 1 tab by mouth daily  #30 x 6   Entered and Authorized by:   Bobby Rumpf  MD   Signed by:   Bobby Rumpf  MD on 08/15/2009   Method used:   [facility]        [facility] [address] [phone]* (retail)       [address]       [place], [place]  [address]       Ph: [phone] or [phone]       Fax: [phone]   RxID:   [id number] PROVENTIL HFA 108 (90 BASE) MCG/ACT  AERS (ALBUTEROL SULFATE) 2 puffs inhaled q 2-4 hours for wheezing or shortness of breath. disp 1 inhaler  #1 x 1   Entered and Authorized by:   Bobby Rumpf  MD   Signed by:   Bobby Rumpf  MD on 08/15/2009   Method used:   [facility]        [facility] [address] [phone]* (retail)       [address]       [place], [place]  [address]       Ph: [phone] or [phone]       Fax:  [phone]   RxID:   [id number] NASONEX 50 MCG/ACT  SUSP (MOMETASONE FUROATE) 2 sprays in each nostril daily as needed congestion  #1 x 1   Entered and Authorized by:   Bobby Rumpf  MD   Signed by:   Bobby Rumpf  MD on 08/15/2009   Method used:   [facility]ronically to        [facility] [address] [phone]* (retail)       [address]       [place], [place]  [address]       Ph: [phone] or [phone]       Fax: [phone]   RxID:   [id number] SPIRONOLACTONE 25 MG TABS (SPIRONOLACTONE) Take 1/2 tablet by mouth twice a day  #30 Tablet x 5   Entered and Authorized by:   Bobby Rumpf  MD   Signed by:   Bobby Rumpf  MD  on 08/15/2009   Method used:   [facility]ronically to        [facility] [address] [phone]* (retail)       [address]       [place], [place]  [address]       Ph: [phone] or [phone]       Fax: [phone]   RxID:   [id number] PAROXETINE HCL 20 MG TABS (PAROXETINE HCL) Take 1 tablet by mouth once a day  #30 Tablet x 5   Entered and Authorized by:   Bobby Rumpf  MD   Signed by:   Bobby Rumpf  MD on 08/15/2009   Method used:   [facility]ronically to        [facility] [address] [phone]* (retail)       [address]       [place], [place]  [address]       Ph: [phone] or [phone]       Fax: [phone]   RxID:   [id number] NORVASC 5 MG TABS (AMLODIPINE BESYLATE) Take 1 tablet by mouth once a day  #30 Tablet x 5   Entered and Authorized by:   Bobby Rumpf  MD   Signed by:   Bobby Rumpf  MD on 08/15/2009   Method used:   Electronically to        [facility] [address] [phone]* (retail)       [address]       [place], [place]  [address]       Ph: [phone] or [phone]       Fax: [phone]   RxID:   [id number] METOPROLOL SUCCINATE 25 MG TB24 (METOPROLOL SUCCINATE) Take 1 tablet by mouth twice a day  #62 Tablet x 5   Entered and Authorized by:   Bobby Rumpf  MD   Signed by:   Bobby Rumpf  MD on 08/15/2009   Method used:   Electronically to        [facility] [address] [phone]* (retail)       [address]       [place], [place]  [address]       Ph: [phone] or [phone]       Fax: [phone]   RxID:   [id number] LASIX 40 MG TABS (FUROSEMIDE) Take 1/2 tablet by mouth  twice a day  #31 Tablet x 5   Entered and Authorized by:   Bobby Rumpf  MD   Signed by:   Bobby Rumpf  MD on 08/15/2009   Method used:   Electronically to        [facility] [address] [phone]* (retail)       [address]       [place], [place]  [address]       Ph:  [phone] or [phone]       Fax: [phone]   RxID:   [id number] ISOSORBIDE DINITRATE 20 MG TABS (ISOSORBIDE DINITRATE) Take 1 tablet by mouth twice a day  #62 Tablet x 5   Entered and Authorized by:   Bobby Rumpf  MD   Signed by:   Bobby Rumpf  MD on 08/15/2009   Method used:   Electronically to        [facility] [address] [phone]* (retail)       [address]       [place], [place]  [address]       Ph: [phone] or [phone]       Fax: [phone]   RxID:   [id number] ASPIRIN CHILDRENS 81 MG CHEW (ASPIRIN) Take 1 tablet by mouth once a day  #30 x 6   Entered and Authorized by:   Bobby Rumpf  MD   Signed by:   Bobby Rumpf  MD on 08/15/2009   Method used:   Electronically to        [facility] [address] [phone]* (retail)       [address]       [place], [place]  [address]       Ph: [phone] or [phone]       Fax: [phone]   RxID:   [id number] EUCERIN  CREA (SKIN PROTECTANTS, MISC.) Apply to hands and other dry areas of skin twice a day. Disp #1tub  #1 x 11   Entered and Authorized by:   Bobby Rumpf  MD   Signed by:   Bobby Rumpf  MD on 08/15/2009   Method used:   Electronically to        [facility] [address] [phone]* (retail)       [address]       [place], [place]  [address]       Ph: [phone] or [phone]       Fax: [phone]   RxID:   [id number]    Prevention & Chronic Care Immunizations   Influenza vaccine: given (Fluvirin - [facility])  (03/25/2009)   Influenza vaccine due: 05/21/2009    Tetanus booster: 07/12/2001: Done.   Tetanus booster due: 07/13/2011    Pneumococcal vaccine: Pneumovax (Medicare)  (07/27/2007)   Pneumococcal vaccine due: None  Colorectal Screening   Hemoccult: Done.  (07/12/2001)   Hemoccult due: Not Indicated    Colonoscopy: normal  (03/06/2008)   Colonoscopy due: 03/06/2018  Other Screening   Pap smear: Done.   (02/09/2001)   Pap smear action/deferral: hysterectomy  (05/03/2007)   Pap smear due: Not Indicated    Mammogram: ASSESSMENT: Negative - BI-RADS 1^MM DIGITAL SCREENING  (02/05/2009)   Mammogram due: 03/13/2009   Smoking status: current  (08/15/2009)   Smoking cessation counseling: yes  (03/26/2008)  Lipids  Total Cholesterol: 181  (10/15/2008)   LDL: 95  (10/15/2008)   LDL Direct: 90  (10/10/2007)   HDL: 50  (10/15/2008)   Triglycerides: 182  (10/15/2008)   Lipid panel due: 11/12/2009    SGOT (AST): 17  (10/15/2008)   SGPT (ALT): 21  (10/15/2008)   Alkaline phosphatase: 88  (10/15/2008)   Total bilirubin: 0.3  (10/15/2008)   Liver panel due: 11/12/2009    Lipid flowsheet reviewed?: Yes   Progress toward LDL goal: At goal  Hypertension   Last Blood Pressure: 123 / 79  (08/15/2009)   Serum creatinine: 1.15  (10/15/2008)   BMP action: Ordered   Serum potassium 5.2  (10/15/2008)   Basic metabolic panel due: 11/12/2009    Hypertension flowsheet reviewed?: Yes   Progress toward BP goal: At goal  Self-Management Support :   Personal Goals (by the next clinic visit) :      Personal blood pressure goal: 140/90  (08/15/2009)     Personal LDL goal: 100  (08/15/2009)    Patient will work on the following items until the next clinic visit to reach self-care goals:     Medications and monitoring: take my medicines every day, check my blood pressure, bring all of my medications to every visit  (08/15/2009)     Eating: drink diet soda or water instead of juice or soda, use fresh or frozen vegetables, eat foods that are low in salt, eat baked foods instead of fried foods  (08/15/2009)     Activity: take a 30 minute walk every day  (08/15/2009)    Hypertension self-management support: Not documented    Hypertension self-management support not done because: Good outcomes  (08/15/2009)    Lipid self-management support: Not documented     Lipid self-management support not done  because: Good outcomes  (08/15/2009)

## 2010-08-11 NOTE — Assessment & Plan Note (Signed)
Summary: f/up,tcb   Vital Signs:  Patient profile:   60 year old female Height:      69 inches Weight:      204.6 pounds BMI:     30.32 Temp:     98.1 degrees F oral Pulse rate:   80 / minute BP sitting:   111 / 65  (left arm) Cuff size:   regular  Vitals Entered By: Garen Grams LPN (December 12, 4538 8:48 AM) CC: f/u   Primary Care Provider:  Bobby Rumpf  MD  CC:  f/u.  History of Present Illness: 1) HTN - Denies chest pain, dyspnea, LE edema, headache.  No side effects from medications. On Bidil, spironolactone, lasix, norvasc, lisinopril, metoprolol. Does not monitor salt. Walks 30 minutes every day. Weight stable. Has eliminated fried foods, increased fresh vegetables, fiber.  2) Tobacco abuse - Still at 1 cigarette per day by gradually cutting back. Uses SABA once a day before bed. Denies worsening dyspnea, cough, wheeze, fever, hemoptysis, chest pain. Contemplative about quitting al the way. Has quit before for a max of 5 months, but started up again when she was around smokers.   3) Depression: Mood = "Great ever since you all started me on that medicine - my son really wanted to thank you as well". She states that family has noted a change; she is less irritable and sad. Denies SI/HI, or side effects of Paxil.  4) HLD: Last lipid panel in April 2010 - trig 182, total 181, HDL 50, LDL 95. Exercise as above. Decreased fried foods. On crestor w/o side effects.   5) Back pain: History of lumbar disk herniation s/p decompression in 1999, subsequent surgery about three or four years. Reports worsening radicular pain bilateral legs L >>> R - "shooting down the back of [her] legs", worsening lumbar pain - relieved by getting up and walking around. Worse with sitting for extended periods of time, lying flat, better with standing and walking. Denies saddle anesthesia, leg weakness or numbness. Was seen by Dr. Danielle Dess in the past but was unable to pay bill - now on disability w/ Medicare.    Current Medications (verified): 1)  Aspirin Childrens 81 Mg Chew (Aspirin) .... Take 1 Tablet By Mouth Once A Day 2)  Isosorbide Dinitrate 20 Mg Tabs (Isosorbide Dinitrate) .... Take 1 Tablet By Mouth Twice A Day 3)  Lasix 40 Mg Tabs (Furosemide) .... Take 1/2 Tablet By Mouth Twice A Day 4)  Metoprolol Succinate 25 Mg Tb24 (Metoprolol Succinate) .... Take 1 Tablet By Mouth Twice A Day 5)  Norvasc 5 Mg Tabs (Amlodipine Besylate) .... Take 1 Tablet By Mouth Once A Day 6)  Paroxetine Hcl 20 Mg Tabs (Paroxetine Hcl) .... Take 1 Tablet By Mouth Once A Day 7)  Spironolactone 25 Mg Tabs (Spironolactone) .... Take 1/2 Tablet By Mouth Twice A Day 8)  Nasonex 50 Mcg/act  Susp (Mometasone Furoate) .... 2 Sprays in Each Nostril Daily As Needed Congestion 9)  Proventil Hfa 108 (90 Base) Mcg/act  Aers (Albuterol Sulfate) .... 2 Puffs Inhaled Q 2-4 Hours For Wheezing or Shortness of Breath. Disp 1 Inhaler 10)  Crestor 10 Mg Tabs (Rosuvastatin Calcium) .... Take 1 Tab By Mouth Daily 11)  Lisinopril 20 Mg  Tabs (Lisinopril) .Marland Kitchen.. 1 Tab By Mouth Daily. 12)  Zithromax Z-Pak 250 Mg Tabs (Azithromycin) .... Use As Directed. 13)  Eucerin  Crea (Skin Protectants, Misc.) .... Apply To Hands and Other Dry Areas of Skin Twice A  Day. Disp #1tub 14)  Cyclobenzaprine Hcl 5 Mg Tabs (Cyclobenzaprine Hcl) .... One Tab By Mouth At Bedtime For Back Pain 15)  Acetaminophen 650 Mg Cr-Tabs (Acetaminophen) .... One Tab By Mouth Three Times A Day As Needed For Back Pain  Allergies (verified): No Known Drug Allergies  Review of Systems       as per HPI o/w negative   Physical Exam  General:  Well appearing woman in NAD, obese BMI = 30 Neck:  no JVD  Lungs:  Normal respiratory effort, chest expands symmetrically. Lungs are clear to auscultation, no crackles or wheezes. Heart:  Normal rate and regular rhythm. S1 and S2 normal without gallop, murmur, click, rub or other extra sounds. No JVD.  Msk:  - positive straight leg  raise on left, negative on right - 5/5 strength bilateral lower extremities with hipflexion, extension, knee flexion extension  - pain at lumbar spine with extremes of flexion, relieved by extension  Pulses:  2+ pedals  Extremities:  no edema  Neurologic:  alert & oriented X3 and cranial nerves II-XII intact.  DTRs symmetrical and normal.     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 Will refer to Neurosurgery for follow up. Some degree of muscle spasm as well - will give trial of flexeril. APAP for pain as well.  Orders: Radiology other (Radiology Other) [facility]- Est  Level 4 (04540)  Problem # 2:  HYPERTENSION, BENIGN SYSTEMIC (ICD-401.1) Assessment: Unchanged  At goal. DASH diet, exercise reviewed.   Her updated medication list for this problem includes:    Lasix 40 Mg Tabs (Furosemide) .Marland Kitchen... Take 1/2 tablet by mouth twice a day    Metoprolol Succinate 25 Mg Tb24 (Metoprolol succinate) .Marland Kitchen... Take 1 tablet by mouth twice a day    Norvasc 5 Mg Tabs (Amlodipine besylate) .Marland Kitchen... Take 1 tablet by mouth once a day    Spironolactone 25 Mg Tabs (Spironolactone) .Marland Kitchen... Take 1/2 tablet by mouth twice a day    Lisinopril 20 Mg Tabs (Lisinopril) .Marland Kitchen... 1 tab by mouth daily.  BP today: 111/65 Prior BP: 123/79 (08/15/2009)  Prior 10 Yr Risk Heart Disease: N/A (04/04/2007)  Labs Reviewed: K+: 4.2 (08/15/2009) Creat: : 1.10 (08/15/2009)   Chol: 181 (10/15/2008)   HDL: 50 (10/15/2008)   LDL: 95 (10/15/2008)   TG: 182 (10/15/2008)  Problem # 3:  DEPRESSIVE DISORDER, NOS (ICD-311)  Stable. Controlled. Continue Paxil. Reinforced positive behaviors.  Her updated medication list for this problem includes:    Paroxetine Hcl 20 Mg Tabs (Paroxetine hcl) .Marland Kitchen... Take 1 tablet by mouth once a day  Problem # 4:  TOBACCO ABUSE (ICD-305.1) Assessment:  Unchanged  Actiely quitting but stuck at 1 cigarette per day. Reviewed healthy distractors from smoking. Advised spacing out cigarettes. Will follow. Quit goal w/in next three months.   Orders: [facility]- Est  Level 4 (98119)  Problem # 5:  HYPERLIPIDEMIA (ICD-272.4) Assessment: Unchanged Check labs today. Next visit in one month.   Her updated medication list for this problem includes:    Crestor 10 Mg Tabs (Rosuvastatin calcium) .Marland Kitchen... Take 1 tab by mouth daily  Orders: Comp Met-[facility] [phone]) Lipid-[facility] [id number]) [facility]- Est  Level 4 (62952)  Labs Reviewed: SGOT: 17 (10/15/2008)   SGPT: 21 (10/15/2008)  Lipid Goals: Chol Goal: 200 (04/04/2007)   HDL Goal: 40 (04/04/2007)   LDL Goal: 70 (07/27/2007)  TG Goal: 150 (04/04/2007)  Prior 10 Yr Risk Heart Disease: N/A (04/04/2007)   HDL:50 (10/15/2008), 47 (07/27/2007)  LDL:95 (10/15/2008), 96 (07/27/2007)  Chol:181 (10/15/2008), 177 (07/27/2007)  Trig:182 (10/15/2008), 170 (07/27/2007)  Complete Medication List: 1)  Aspirin Childrens 81 Mg Chew (Aspirin) .... Take 1 tablet by mouth once a day 2)  Isosorbide Dinitrate 20 Mg Tabs (Isosorbide dinitrate) .... Take 1 tablet by mouth twice a day 3)  Lasix 40 Mg Tabs (Furosemide) .... Take 1/2 tablet by mouth twice a day 4)  Metoprolol Succinate 25 Mg Tb24 (Metoprolol succinate) .... Take 1 tablet by mouth twice a day 5)  Norvasc 5 Mg Tabs (Amlodipine besylate) .... Take 1 tablet by mouth once a day 6)  Paroxetine Hcl 20 Mg Tabs (Paroxetine hcl) .... Take 1 tablet by mouth once a day 7)  Spironolactone 25 Mg Tabs (Spironolactone) .... Take 1/2 tablet by mouth twice a day 8)  Nasonex 50 Mcg/act Susp (Mometasone furoate) .... 2 sprays in each nostril daily as needed congestion 9)  Proventil Hfa 108 (90 Base) Mcg/act Aers (Albuterol sulfate) .... 2 puffs inhaled q 2-4 hours for wheezing or shortness of breath. disp 1 inhaler 10)  Crestor 10 Mg Tabs (Rosuvastatin calcium) .... Take 1 tab by  mouth daily 11)  Lisinopril 20 Mg Tabs (Lisinopril) .Marland Kitchen.. 1 tab by mouth daily. 12)  Zithromax Z-pak 250 Mg Tabs (Azithromycin) .... Use as directed. 13)  Eucerin Crea (Skin protectants, misc.) .... Apply to hands and other dry areas of skin twice a day. disp #1tub 14)  Cyclobenzaprine Hcl 5 Mg Tabs (Cyclobenzaprine hcl) .... One tab by mouth at bedtime for back pain 15)  Acetaminophen 650 Mg Cr-tabs (Acetaminophen) .... One tab by mouth three times a day as needed for back pain  Patient Instructions: 1)  We will get Xrays of your back.  2)  We will refer you to a neurosurgeon for further evaluation of your back pain. 3)  Follow up with me in one month. 4)  I have prescribed a medication called cyclobenzaprine to take at night to help with your back pain. 5)  I also would like you to take acetaminophen (Tylenol as directed to help with your back pain) 6)  YOU CAN QUIT SMOKING ALL THE WAY! I BELIEVE IN YOU! 7)  It was great to see you today!  Prescriptions: ACETAMINOPHEN 650 MG CR-TABS (ACETAMINOPHEN) one tab by mouth three times a day as needed for back pain  #90 x 3   Entered and Authorized by:   Bobby Rumpf  MD   Signed by:   Bobby Rumpf  MD on 12/11/2009   Method used:   Electronically to        [facility] [facility] [phone]* (retail)       [address]       [place], [place]  [address]       Ph: [phone] or [phone]       Fax: [phone]   RxID:   [id number] CYCLOBENZAPRINE HCL 5 MG TABS (CYCLOBENZAPRINE HCL) one tab by mouth at bedtime for back pain  #30 x 1   Entered and Authorized by:   Bobby Rumpf  MD   Signed by:   Bobby Rumpf  MD on 12/11/2009   Method used:   [facility] to        [facility] [address] [phone]* (retail)       [address]  [place], [place]  [address]       Ph: [phone] or [phone]       Fax: [phone]   RxID:   [id number]    Prevention & Chronic Care Immunizations    Influenza vaccine: given (Fluvirin - [facility])  (03/25/2009)   Influenza vaccine due: 05/21/2009    Tetanus booster: 07/12/2001: Done.   Tetanus booster due: 07/13/2011    Pneumococcal vaccine: Pneumovax (Medicare)  (07/27/2007)   Pneumococcal vaccine due: None  Colorectal Screening   Hemoccult: Done.  (07/12/2001)   Hemoccult due: Not Indicated    Colonoscopy: normal  (03/06/2008)   Colonoscopy due: 03/06/2018  Other Screening   Pap smear: Done.  (02/09/2001)   Pap smear action/deferral: hysterectomy  (05/03/2007)   Pap smear due: Not Indicated    Mammogram: ASSESSMENT: Negative - BI-RADS 1^MM DIGITAL SCREENING  (02/05/2009)   Mammogram due: 03/13/2009   Smoking status: current  (08/15/2009)   Smoking cessation counseling: yes  (03/26/2008)  Lipids   Total Cholesterol: 181  (10/15/2008)   LDL: 95  (10/15/2008)   LDL Direct: 90  (10/10/2007)   HDL: 50  (10/15/2008)   Triglycerides: 182  (10/15/2008)   Lipid panel due: 11/12/2009    SGOT (AST): 17  (10/15/2008)   SGPT (ALT): 21  (10/15/2008) CMP ordered    Alkaline phosphatase: 88  (10/15/2008)   Total bilirubin: 0.3  (10/15/2008)   Liver panel due: 11/12/2009    Lipid flowsheet reviewed?: Yes   Progress toward LDL goal: At goal  Hypertension   Last Blood Pressure: 111 / 65  (12/11/2009)   Serum creatinine: 1.10  (08/15/2009)   BMP action: Ordered   Serum potassium 4.2  (08/15/2009) CMP ordered    Basic metabolic panel due: 11/12/2009    Hypertension flowsheet reviewed?: Yes   Progress toward BP goal: At goal  Self-Management Support :   Personal Goals (by the next clinic visit) :      Personal blood pressure goal: 140/90  (08/15/2009)     Personal LDL goal: 100  (08/15/2009)    Hypertension self-management support: Not documented    Hypertension self-management support not done because: Good outcomes  (12/11/2009)    Lipid self-management support: Not documented     Lipid self-management support  not done because: Good outcomes  (12/11/2009)

## 2010-08-11 NOTE — Miscellaneous (Signed)
Summary: ROI  ROI   Imported By: Clydell Hakim 08/25/2009 12:18:09  _____________________________________________________________________  External Attachment:    Type:   Image     Comment:   External Document

## 2010-08-11 NOTE — Consult Note (Signed)
Summary: [facility] - Transthoracic  Echocardiogram Study  [facility] - Transthoracic  Echocardiogram Study   Imported By: Clydell Hakim 08/26/2009 15:59:51  _____________________________________________________________________  External Attachment:    Type:   Image     Comment:   External Document  Appended Document: [facility] - Transthoracic  Echocardiogram Study Echo w/ mild LVH w/ EF 46% (mildly reduced)

## 2010-08-11 NOTE — Letter (Signed)
Summary: [place] Certificate of Disability  State [place] Certificate of Disability   Imported By: Clydell Hakim 12/08/2009 08:43:51  _____________________________________________________________________  External Attachment:    Type:   Image     Comment:   External Document

## 2010-08-11 NOTE — Consult Note (Signed)
Summary: Cardiology  Cardiology   Imported By: Clydell Hakim 08/26/2009 15:50:54  _____________________________________________________________________  External Attachment:    Type:   Image     Comment:   External Document  Appended Document: Cardiology Reviewed

## 2010-08-11 NOTE — Assessment & Plan Note (Signed)
Summary: f/u,df   Vital Signs:  Patient profile:   60 year old female Height:      69 inches Weight:      204 pounds BMI:     30.23 BSA:     2.08 Temp:     98.0 degrees F Pulse rate:   72 / minute BP sitting:   125 / 73  Vitals Entered By: Jone Baseman CMA (January 23, 2010 8:31 AM) CC: f/u Is Patient Diabetic? No Pain Assessment Patient in pain? no        Primary Care Provider:  Bobby Rumpf  MD  CC:  f/u.  History of Present Illness: 1) HTN - Denies chest pain, dyspnea, LE edema, headache.  No side effects from medications. On Bidil, spironolactone, lasix, norvasc, lisinopril, metoprolol. Still does not monitor salt. Walks 30 minutes every day. Weight stable. Has eliminated fried foods, increased fresh vegetables, fiber.  2) Tobacco abuse - Was at 1 cigarette per day by gradually cutting back. Has increased to 3-4 a day due to increased stress. Uses SABA once a day before bed. Denies worsening dyspnea, cough, wheeze, fever, hemoptysis, chest pain. Contemplative about quitting all the way. Has quit before for a max of 5 months, but started up again when she was around smokers.   3) HLD: Lipid panel at last visit trig 239, HDL 46, LDL 176. She admitted that she had stopped her Crestor (at last visit had said she was taking this) - she restarted this after I gave her her lab results over the phone. Prior lipid panel in April 2010 - trig 182, total 181, HDL 50, LDL 95. Exercise as above. Decreased fried foods. Nw back on crestor w/o side effects.   4) Back pain: Stable since last visit. History of lumbar disk herniation s/p decompression in 1999, subsequent surgery about three or four years. Reports worsening radicular pain bilateral legs L >>> R - "shooting down the back of [her] legs", worsening lumbar pain - relieved by getting up and walking around. Worse with sitting for extended periods of time, lying flat, better with standing and walking. Denies saddle anesthesia, numbness.  History of foot drop since prior to surgery - fell recently due to not wearing her orthotic. Was seen by Dr. Danielle Dess in the past but was unable to pay bill - now on disability w/ Medicare. Was supposed to have films done but "does not want the films because [she] does not want surgery"  Habits & Providers  Alcohol-Tobacco-Diet     Tobacco Status: current     Tobacco Counseling: to quit use of tobacco products     Cigarette Packs/Day: <0.25  Current Medications (verified): 1)  Aspirin Childrens 81 Mg Chew (Aspirin) .... Take 1 Tablet By Mouth Once A Day 2)  Isosorbide Dinitrate 20 Mg Tabs (Isosorbide Dinitrate) .... Take 1 Tablet By Mouth Twice A Day 3)  Lasix 40 Mg Tabs (Furosemide) .... Take 1/2 Tablet By Mouth Twice A Day 4)  Metoprolol Succinate 25 Mg Tb24 (Metoprolol Succinate) .... Take 1 Tablet By Mouth Twice A Day 5)  Norvasc 5 Mg Tabs (Amlodipine Besylate) .... Take 1 Tablet By Mouth Once A Day 6)  Paroxetine Hcl 20 Mg Tabs (Paroxetine Hcl) .... Take 1 Tablet By Mouth Once A Day 7)  Spironolactone 25 Mg Tabs (Spironolactone) .... Take 1/2 Tablet By Mouth Twice A Day 8)  Nasonex 50 Mcg/act  Susp (Mometasone Furoate) .... 2 Sprays in Each Nostril Daily As Needed Congestion 9)  Proventil Hfa 108 (90 Base) Mcg/act  Aers (Albuterol Sulfate) .... 2 Puffs Inhaled Q 2-4 Hours For Wheezing or Shortness of Breath. Disp 1 Inhaler 10)  Crestor 10 Mg Tabs (Rosuvastatin Calcium) .... Take 1 Tab By Mouth Daily 11)  Lisinopril 20 Mg  Tabs (Lisinopril) .Marland Kitchen.. 1 Tab By Mouth Daily. 12)  Zithromax Z-Pak 250 Mg Tabs (Azithromycin) .... Use As Directed. 13)  Eucerin  Crea (Skin Protectants, Misc.) .... Apply To Hands and Other Dry Areas of Skin Twice A Day. Disp #1tub 14)  Cyclobenzaprine Hcl 5 Mg Tabs (Cyclobenzaprine Hcl) .... One Tab By Mouth At Bedtime For Back Pain 15)  Acetaminophen 650 Mg Cr-Tabs (Acetaminophen) .... One Tab By Mouth Three Times A Day As Needed For Back Pain  Allergies  (verified): No Known Drug Allergies  Review of Systems       as per HPI o/w negative   Physical Exam  General:  Well appearing woman in NAD, obese BMI = 30 Neck:  no JVD  Lungs:  Normal respiratory effort, chest expands symmetrically. Lungs are clear to auscultation, no crackles or wheezes. Heart:  Normal rate and regular rhythm. S1 and S2 normal without gallop, murmur, click, rub or other extra sounds. No JVD.  Msk:  - positive straight leg raise on left, negative on right - 5/5 strength bilateral lower extremities with hipflexion, extension, knee flexion extension  - pain at lumbar spine with extremes of flexion, relieved by extension  - 4-/5 strength at right ankle with dorsiflexion  Pulses:  2+ pedals  Extremities:  no edema  Neurologic:  alert & oriented X3.     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 Her pain is stable since last visit (though it had been worsening over the past few years). Also, there was some element of muscle spasm relieved by as needed Flexeril. APAP for pain. Advised to wear orthotic with foot drop to prevent falls.   Orders: [facility]- Est  Level 4 (29518)  Problem # 2:  HYPERTENSION, BENIGN SYSTEMIC (ICD-401.1)  At goal. DASH diet, exercise reviewed.   Her updated medication list for this problem includes:    Lasix 40 Mg Tabs (Furosemide) .Marland Kitchen... Take 1/2 tablet by mouth twice a day    Metoprolol Succinate 25 Mg Tb24 (Metoprolol succinate) .Marland Kitchen... Take 1 tablet by mouth twice a day    Norvasc 5 Mg Tabs (Amlodipine besylate) .Marland Kitchen... Take 1 tablet by mouth once a day    Spironolactone 25 Mg Tabs (Spironolactone) .Marland Kitchen... Take 1/2 tablet by mouth twice a day    Lisinopril 20 Mg Tabs (Lisinopril) .Marland Kitchen... 1 tab by mouth daily.  BP today: 125/73 Prior BP: 111/65 (12/11/2009)  Prior 10 Yr Risk Heart Disease: N/A (04/04/2007)  Labs  Reviewed: K+: 4.9 (12/11/2009) Creat: : 1.17 (12/11/2009)   Chol: 270 (12/11/2009)   HDL: 46 (12/11/2009)   LDL: 176 (12/11/2009)   TG: 239 (12/11/2009)  Orders: [facility]- Est  Level 4 (84166)  Problem # 3:  HYPERLIPIDEMIA (ICD-272.4)  Restarted Crestor. LFTs, LDL at next visit.  Her updated medication list for this problem includes:    Crestor 10 Mg Tabs (Rosuvastatin calcium) .Marland Kitchen... Take 1 tab by mouth daily  Labs Reviewed: SGOT: 18 (12/11/2009)   SGPT: 20 (12/11/2009)  Lipid Goals: Chol Goal: 200 (04/04/2007)   HDL Goal: 40 (04/04/2007)   LDL Goal: 70 (07/27/2007)   TG  Goal: 150 (04/04/2007)  Prior 10 Yr Risk Heart Disease: N/A (04/04/2007)   HDL:46 (12/11/2009), 50 (10/15/2008)  LDL:176 (12/11/2009), 95 (16/04/9603)  Chol:270 (12/11/2009), 181 (10/15/2008)  Trig:239 (12/11/2009), 182 (10/15/2008)  Orders: [facility]- Est  Level 4 (54098)  Problem # 4:  TOBACCO ABUSE (ICD-305.1) Assessment: Deteriorated  Contemplative about actively quitting. Reviewed healthy distractors from smoking. Advised spacing out cigarettes. Will follow. Quit goal w/in next three months.   Orders: [facility]- Est  Level 4 (11914)  Complete Medication List: 1)  Aspirin Childrens 81 Mg Chew (Aspirin) .... Take 1 tablet by mouth once a day 2)  Isosorbide Dinitrate 20 Mg Tabs (Isosorbide dinitrate) .... Take 1 tablet by mouth twice a day 3)  Lasix 40 Mg Tabs (Furosemide) .... Take 1/2 tablet by mouth twice a day 4)  Metoprolol Succinate 25 Mg Tb24 (Metoprolol succinate) .... Take 1 tablet by mouth twice a day 5)  Norvasc 5 Mg Tabs (Amlodipine besylate) .... Take 1 tablet by mouth once a day 6)  Paroxetine Hcl 20 Mg Tabs (Paroxetine hcl) .... Take 1 tablet by mouth once a day 7)  Spironolactone 25 Mg Tabs (Spironolactone) .... Take 1/2 tablet by mouth twice a day 8)  Nasonex 50 Mcg/act Susp (Mometasone furoate) .... 2 sprays in each nostril daily as needed congestion 9)  Proventil Hfa 108 (90 Base) Mcg/act Aers  (Albuterol sulfate) .... 2 puffs inhaled q 2-4 hours for wheezing or shortness of breath. disp 1 inhaler 10)  Crestor 10 Mg Tabs (Rosuvastatin calcium) .... Take 1 tab by mouth daily 11)  Lisinopril 20 Mg Tabs (Lisinopril) .Marland Kitchen.. 1 tab by mouth daily. 12)  Zithromax Z-pak 250 Mg Tabs (Azithromycin) .... Use as directed. 13)  Eucerin Crea (Skin protectants, misc.) .... Apply to hands and other dry areas of skin twice a day. disp #1tub 14)  Cyclobenzaprine Hcl 5 Mg Tabs (Cyclobenzaprine hcl) .... One tab by mouth at bedtime for back pain 15)  Acetaminophen 650 Mg Cr-tabs (Acetaminophen) .... One tab by mouth three times a day as needed for back pain  Patient Instructions: 1)  It was great to see you today!  2)  Do you best to wear your ankle brace to protect you from falls.  3)  If you are continuing to have a problem, we can try to set you up with Sports Medicine to see if they have an alternative brace. 4)  If your back pain continues to be a problem, we need to get you back in with the neurosurgeons. 5)  Work on [employer] smoking as we discussed. 6)  Follow up in 6 months.    Prevention & Chronic Care Immunizations   Influenza vaccine: given (Fluvirin - [facility])  (03/25/2009)   Influenza vaccine due: 05/21/2009    Tetanus booster: 07/12/2001: Done.   Tetanus booster due: 07/13/2011    Pneumococcal vaccine: Pneumovax (Medicare)  (07/27/2007)   Pneumococcal vaccine due: None  Colorectal Screening   Hemoccult: Done.  (07/12/2001)   Hemoccult due: Not Indicated    Colonoscopy: normal  (03/06/2008)   Colonoscopy due: 03/06/2018  Other Screening   Pap smear: Done.  (02/09/2001)   Pap smear action/deferral: hysterectomy  (05/03/2007)   Pap smear due: Not Indicated    Mammogram: ASSESSMENT: Negative - BI-RADS 1^MM DIGITAL SCREENING  (02/05/2009)   Mammogram due: 03/13/2009   Smoking status: current  (01/23/2010)   Smoking cessation counseling: yes  (03/26/2008)  Lipids   Total  Cholesterol: 270  (12/11/2009)   LDL: 176  (  12/11/2009)   LDL Direct: 90  (10/10/2007)   HDL: 46  (12/11/2009)   Triglycerides: 239  (12/11/2009)   Lipid panel due: 07/26/2010    SGOT (AST): 18  (12/11/2009)   SGPT (ALT): 20  (12/11/2009)   Alkaline phosphatase: 95  (12/11/2009)   Total bilirubin: 0.4  (12/11/2009)   Liver panel due: 07/26/2010    Lipid flowsheet reviewed?: Yes   Progress toward LDL goal: Deteriorated  Hypertension   Last Blood Pressure: 125 / 73  (01/23/2010)   Serum creatinine: 1.17  (12/11/2009)   BMP action: Ordered   Serum potassium 4.9  (12/11/2009)   Basic metabolic panel due: 07/26/2010    Hypertension flowsheet reviewed?: Yes   Progress toward BP goal: At goal  Self-Management Support :   Personal Goals (by the next clinic visit) :      Personal blood pressure goal: 140/90  (08/15/2009)     Personal LDL goal: 100  (08/15/2009)    Patient will work on the following items until the next clinic visit to reach self-care goals:     Medications and monitoring: take my medicines every day, check my blood pressure, bring all of my medications to every visit, weigh myself weekly  (01/23/2010)     Eating: drink diet soda or water instead of juice or soda, eat more vegetables, use fresh or frozen vegetables, eat foods that are low in salt, eat baked foods instead of fried foods, eat fruit for snacks and desserts  (01/23/2010)     Activity: take a 30 minute walk every day  (01/23/2010)    Hypertension self-management support: Not documented    Hypertension self-management support not done because: Good outcomes  (01/23/2010)    Lipid self-management support: Written self-care plan  (01/23/2010)   Lipid self-care plan printed.    Lipid self-management support not done because: Good outcomes  (12/11/2009)

## 2010-08-11 NOTE — Miscellaneous (Signed)
Summary: Disability  pt dropped off disability form for property tax exclusion to be completed, placed in MD box for signature. Denny Peon Odell  Dec 04, 2009 8:19 AM    form filled and signed. called patient to let know can pick up.  Bobby Rumpf  MD  Dec 05, 2009 2:50 PM

## 2010-08-11 NOTE — Miscellaneous (Signed)
Summary: CHF QI   Clinical Lists Changes  Problems: Changed problem from CHF - EJECTION FRACTION < 50% (ICD-428.22) to CHRONIC SYSTOLIC HEART FAILURE (ICD-428.22) 

## 2010-08-13 NOTE — Assessment & Plan Note (Signed)
Summary: cpe,df   Vital Signs:  Patient profile:   60 year old female Height:      69 inches Weight:      200 pounds BMI:     29.64 Temp:     98.2 degrees F oral Pulse rate:   59 / minute BP sitting:   148 / 90  (left arm) Cuff size:   regular  Vitals Entered By: Garen Grams LPN (July 01, 2010 8:40 AM) CC: f/u bp, back and leg pain Is Patient Diabetic? No Pain Assessment Patient in pain? yes     Location: feet and legs   Primary Care Demarquez Ciolek:  Bobby Rumpf  MD  CC:  f/u bp and back and leg pain.  History of Present Illness: 1) HTN - BP 148/90 today. Denies chest pain, dyspnea, LE edema, headache.  No side effects from medications. On Bidil, spironolactone, lasix, norvasc, lisinopril, metoprolol. Still does not monitor salt. Walks > 30 minutes every day (sometimes twice a day). Weight down 4lbs since last visit. Has eliminated fried foods, increased fresh vegetables, fiber.  2) Tobacco abuse - Was at 1 cigarette per day by gradually cutting back. Had increased to 3-4 a day due to increased stress. Now back to 2 per day. Uses SABA once a day before bed. Denies worsening dyspnea, cough, wheeze, fever, hemoptysis, chest pain. Contemplative about quitting all the way. Has quit before for a max of 5 months, but started up again when she was around smokers. Tries to distract herself with chores when she feels like smoking  3) Back pain: Stable since last visit. History of lumbar disk herniation s/p decompression in 1999, subsequent surgery about three or four years. Reports continued radicular pain bilateral legs L > R - "shooting down the back of [her] legs", continued lumbar pain - relieved by getting up and walking around. Worse with sitting for extended periods of time, lying flat, better with standing and walking. Denies saddle anesthesia, numbness. History of foot drop since prior to surgery - has been consistently wearing her orthotic. Was seen by Dr. Danielle Dess in the past but was  unable to pay bill - now on disability w/ Medicare. Was supposed to have films done but "does not want the films because [she] does not want surgery".   4) HLD: Back on Crestor w/o side effects.   Habits & Providers  Alcohol-Tobacco-Diet     Alcohol drinks/day: <1     Alcohol Counseling: not indicated; use of alcohol is not excessive or problematic     Alcohol type: beer     Tobacco Status: current     Tobacco Counseling: to quit use of tobacco products     Cigarette Packs/Day: <0.25     Year Quit: 4 days ago     Passive Smoke Exposure: no     Diet Comments: wt loss  Current Medications (verified): 1)  Aspirin Childrens 81 Mg Chew (Aspirin) .... Take 1 Tablet By Mouth Once A Day 2)  Isosorbide Dinitrate 20 Mg Tabs (Isosorbide Dinitrate) .... Take 1 Tablet By Mouth Twice A Day 3)  Lasix 40 Mg Tabs (Furosemide) .... Take 1/2 Tablet By Mouth Twice A Day 4)  Metoprolol Succinate 25 Mg Tb24 (Metoprolol Succinate) .... Take 1 Tablet By Mouth Twice A Day 5)  Norvasc 5 Mg Tabs (Amlodipine Besylate) .... Take 1 Tablet By Mouth Once A Day 6)  Paroxetine Hcl 20 Mg Tabs (Paroxetine Hcl) .... Take 1 Tablet By Mouth Once A Day 7)  Spironolactone 25 Mg Tabs (Spironolactone) .... Take 1/2 Tablet By Mouth Twice A Day 8)  Nasonex 50 Mcg/act  Susp (Mometasone Furoate) .... 2 Sprays in Each Nostril Daily As Needed Congestion 9)  Proventil Hfa 108 (90 Base) Mcg/act  Aers (Albuterol Sulfate) .... 2 Puffs Inhaled Q 2-4 Hours For Wheezing or Shortness of Breath. Disp 1 Inhaler 10)  Crestor 10 Mg Tabs (Rosuvastatin Calcium) .... Take 1 Tab By Mouth Daily 11)  Lisinopril 20 Mg  Tabs (Lisinopril) .Marland Kitchen.. 1 Tab By Mouth Daily. 12)  Zithromax Z-Pak 250 Mg Tabs (Azithromycin) .... Use As Directed. 13)  Eucerin  Crea (Skin Protectants, Misc.) .... Apply To Hands and Other Dry Areas of Skin Twice A Day. Disp #1tub 14)  Cyclobenzaprine Hcl 5 Mg Tabs (Cyclobenzaprine Hcl) .... One Tab By Mouth At Bedtime For Back  Pain 15)  Acetaminophen 650 Mg Cr-Tabs (Acetaminophen) .... One Tab By Mouth Three Times A Day As Needed For Back Pain  Allergies (verified): No Known Drug Allergies  Family History: 4 sibs, 3 with CAD, father deceased 18-Jun-2001 of colon CA., mother deceased of breast CA sister with breast cancer now s/p bilateral mastectomy   Social History: +tobacco, no EtOH or drug use.   Physical Exam  General:  Well appearing woman in NAD,overweight. Vitals reviewed  Lungs:  Normal respiratory effort, chest expands symmetrically. Lungs are clear to auscultation, no crackles or wheezes. Heart:  Normal rate and regular rhythm. S1 and S2 normal without gallop, murmur, click, rub or other extra sounds. No JVD.  Msk:  - positive straight leg raise on left, negative on right - 5/5 strength bilateral lower extremities with hipflexion, extension, knee flexion extension  - pain at lumbar spine with extremes of flexion, relieved by extension  - 4/5 strength at right ankle with dorsiflexion  Neurologic:  alert & oriented X3.  cranial nerves II-XII intact.     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 Patient does not wish to have films or neurosurgical re-evaluation at this time. APAP for pain. Advised to wear orthotic with foot drop to prevent falls. Reviewed back exercises.   Orders: [facility]- Est  Level 4 (16109)  Problem # 2:  HYPERTENSION, BENIGN SYSTEMIC (ICD-401.1) Assessment: Unchanged  Slightly above goal (140/90) today. Advised regarding salt restriction, DASH diet, exercise, quitting smoking. Follow in 6 months.  Her updated medication list for this problem includes:    Lasix 40 Mg Tabs (Furosemide) .Marland Kitchen... Take 1/2 tablet by mouth twice a day    Metoprolol Succinate 25 Mg Tb24 (Metoprolol succinate) .Marland Kitchen... Take 1 tablet by mouth twice a day    Norvasc 5 Mg Tabs (Amlodipine besylate) .Marland Kitchen... Take 1 tablet by mouth once a day     Spironolactone 25 Mg Tabs (Spironolactone) .Marland Kitchen... Take 1/2 tablet by mouth twice a day    Lisinopril 20 Mg Tabs (Lisinopril) .Marland Kitchen... 1 tab by mouth daily.  BP today: 148/90 Prior BP: 125/73 (01/23/2010)  Prior 10 Yr Risk Heart Disease: N/A (04/04/2007)  Labs Reviewed: K+: 4.9 (12/11/2009) Creat: : 1.17 (12/11/2009)   Chol: 270 (12/11/2009)   HDL: 46 (12/11/2009)   LDL: 176 (12/11/2009)   TG: 239 (12/11/2009)  Orders: [facility]- Est  Level 4 (60454)  Problem # 3:  TOBACCO ABUSE (ICD-305.1) Assessment: Deteriorated  Contemplative about actively quitting. Reviewed healthy distractors from smoking. Advised spacing out cigarettes. Will follow. Quit goal w/in next three months.   Orders: [facility]- Est  Level  4 (95188)  Problem # 4:  HYPERLIPIDEMIA (ICD-272.4)  Recheck LFTs, LDL now back on Crestor.   Her updated medication list for this problem includes:    Crestor 10 Mg Tabs (Rosuvastatin calcium) .Marland Kitchen... Take 1 tab by mouth daily  Orders: Comp Met-[facility] [phone]) Direct LDL-[facility] [phone]) [facility]- Est  Level 4 (32202)  Labs Reviewed: SGOT: 18 (12/11/2009)   SGPT: 20 (12/11/2009)  Lipid Goals: Chol Goal: 200 (04/04/2007)   HDL Goal: 40 (04/04/2007)   LDL Goal: 70 (07/27/2007)   TG Goal: 150 (04/04/2007)  Prior 10 Yr Risk Heart Disease: N/A (04/04/2007)   HDL:46 (12/11/2009), 50 (10/15/2008)  LDL:176 (12/11/2009), 95 (54/27/0623)  Chol:270 (12/11/2009), 181 (10/15/2008)  Trig:239 (12/11/2009), 182 (10/15/2008)  Complete Medication List: 1)  Aspirin Childrens 81 Mg Chew (Aspirin) .... Take 1 tablet by mouth once a day 2)  Isosorbide Dinitrate 20 Mg Tabs (Isosorbide dinitrate) .... Take 1 tablet by mouth twice a day 3)  Lasix 40 Mg Tabs (Furosemide) .... Take 1/2 tablet by mouth twice a day 4)  Metoprolol Succinate 25 Mg Tb24 (Metoprolol succinate) .... Take 1 tablet by mouth twice a day 5)  Norvasc 5 Mg Tabs (Amlodipine besylate) .... Take 1 tablet by mouth once a day 6)  Paroxetine Hcl  20 Mg Tabs (Paroxetine hcl) .... Take 1 tablet by mouth once a day 7)  Spironolactone 25 Mg Tabs (Spironolactone) .... Take 1/2 tablet by mouth twice a day 8)  Nasonex 50 Mcg/act Susp (Mometasone furoate) .... 2 sprays in each nostril daily as needed congestion 9)  Proventil Hfa 108 (90 Base) Mcg/act Aers (Albuterol sulfate) .... 2 puffs inhaled q 2-4 hours for wheezing or shortness of breath. disp 1 inhaler 10)  Crestor 10 Mg Tabs (Rosuvastatin calcium) .... Take 1 tab by mouth daily 11)  Lisinopril 20 Mg Tabs (Lisinopril) .Marland Kitchen.. 1 tab by mouth daily. 12)  Zithromax Z-pak 250 Mg Tabs (Azithromycin) .... Use as directed. 13)  Eucerin Crea (Skin protectants, misc.) .... Apply to hands and other dry areas of skin twice a day. disp #1tub 14)  Cyclobenzaprine Hcl 5 Mg Tabs (Cyclobenzaprine hcl) .... One tab by mouth at bedtime for back pain 15)  Acetaminophen 650 Mg Cr-tabs (Acetaminophen) .... One tab by mouth three times a day as needed for back pain  Patient Instructions: 1)  It was great to see you today!  2)  Do you best to wear your ankle brace to protect you from falls.  3)  If your back pain continues to be a problem, we need to get you back in with the neurosurgeons. 4)  Work on quitting smoking as we discussed. 5)  continue all your medications otherwise  6)  Follow up in 6 months.    Orders Added: 1)  Comp Met-[facility] [[id number]] 2)  Direct LDL-[facility] [[id number]] 3)  [facility]- Est  Level 4 [76283]

## 2010-08-13 NOTE — Letter (Signed)
Summary: Lipid Letter  [facility]  [address]   [place], [place] [address]   Phone: [phone]  Fax: [phone]    07/07/2010  Notnamed Croucher [address] [place], [place]  [address]  Dear Ms. Kizzie Bane:  We have carefully reviewed your last cholesterol numbers from 12/11/2009:     LDL "bad" Cholesterol:   167     Goal: <70    IF YOU ARE NOT TAKING YOUR CRESTOR YOU NEED TO RESTART THIS.  IF YOU ARE NOT TAKING THE CRESTOR DUE TO COST, PLEASE LET ME KNOW SO WE CAN SWITCH YOU TO SOMETHING CHEAPER.  Sincerely,    [facility] Bobby Rumpf  MD  Appended Document: Lipid Letter mailed

## 2010-08-13 NOTE — Progress Notes (Signed)
Summary: ROI  ROI   Imported By: De Nurse 07/23/2010 16:36:44  _____________________________________________________________________  External Attachment:    Type:   Image     Comment:   External Document

## 2010-08-23 ENCOUNTER — Other Ambulatory Visit: Payer: Self-pay | Admitting: Family Medicine

## 2010-08-23 NOTE — Telephone Encounter (Signed)
Refill request

## 2010-08-24 ENCOUNTER — Other Ambulatory Visit: Payer: Self-pay | Admitting: Family Medicine

## 2010-09-14 ENCOUNTER — Other Ambulatory Visit: Payer: Self-pay | Admitting: Family Medicine

## 2010-09-14 NOTE — Telephone Encounter (Signed)
Refill request

## 2010-10-16 ENCOUNTER — Other Ambulatory Visit: Payer: Self-pay | Admitting: Family Medicine

## 2010-10-16 NOTE — Telephone Encounter (Signed)
Refill request

## 2010-11-19 ENCOUNTER — Other Ambulatory Visit: Payer: Self-pay | Admitting: Family Medicine

## 2010-11-19 NOTE — Telephone Encounter (Signed)
Refill request

## 2010-11-27 NOTE — Op Note (Signed)
NAMESKYLEIGH, Lindsey Payne            ACCOUNT NO.:  [id number]   MEDICAL RECORD NO.:  [id number]          PATIENT TYPE:  AMB   LOCATION:  ENDO                         FACILITY:  [facility]   PHYSICIAN:  Sandria Bales. Ezzard Standing, M.D.  DATE OF BIRTH:  10-22-1950   DATE OF PROCEDURE:  06/18/2004  DATE OF DISCHARGE:                                 OPERATIVE REPORT   PREOPERATIVE DIAGNOSIS:  History of colonic polyp.   POSTOPERATIVE DIAGNOSIS:  Normal anastomosis and normal colonoscopic  examination.   PROCEDURE:  Flexible colonoscopy.   SURGEON:  Sandria Bales. Ezzard Standing, M.D.   ANESTHESIA:  Demerol 50 mg and Versed 4 mg.   COMPLICATIONS:  None.   INDICATIONS FOR PROCEDURE:  Ms. Bolls is a 60 year old black female who had  a right hemicolectomy by Dr. Ovidio Kin in [phone] for a cecal tubulovillous  adenoma.  At that time, there was no evidence of malignancy.  Her last  colonoscopy was April of 2000.  She is now for follow-up colonoscopy.  She  is followed by [facility] for primary medical  standpoint but should not remember the name of her primary care physician  and Dr. Georga Hacking from a cardiac standpoint.   DESCRIPTION OF PROCEDURE:  Patient completed a Halflytely bowel prep at home  and was placed in the left lateral decubitus position.  She was monitored  with a pulse oximetry, EKG, a blood pressure cuff and given two liters of  nasal O2 during the procedure.   She is placed in the left lateral decubitus position and given initially 50  mg of Demerol and 4 mg of Versed.  A flexible Olympus colonoscope was passed  without difficulty up her rectum through her sigmoid colon, left colon and  to her transverse colon.  The end of her colon and anastomosis was  visualized about 120 cm from her anal verge.  She did have a moderate amount  of stool in her transverse colon but the remainder of her colon was clear.  There was no mucosal lesion, polyp or growth noted.  The  transverse colon,  left colon and sigmoid colon were all unremarkable and the scope was then  retroflexed in the rectum and she had some internal hemorrhoids but other  than that, nothing significant.   She tolerated the procedure well.  I spoke to her son after the procedure.  I would recommend her next colonoscopy in five years unless she should have  a change in symptoms or problems.      Pervis Hocking   DHN/MEDQ  D:  06/18/2004  T:  06/18/2004  Job:  [id number]   cc:   Darden Palmer., M.D.  [address], [address]  [place]  [place] [address]  Fax: [phone]

## 2010-11-27 NOTE — H&P (Signed)
NAME:  Lindsey Payne, Lindsey Payne                      ACCOUNT NO.:  [id number]   MEDICAL RECORD NO.:  [id number]                   PATIENT TYPE:  INP   LOCATION:  6524                                 FACILITY:  [facility]   PHYSICIAN:  Leighton Roach McDiarmid, M.D.             DATE OF BIRTH:  Nov 18, 1950   DATE OF ADMISSION:  09/18/2003  DATE OF DISCHARGE:  09/21/2003                                HISTORY & PHYSICAL   CHIEF COMPLAINT:  Chest pain.   HISTORY OF PRESENT ILLNESS:  This is a 60 year old African American female  complaining of chest pain beginning approximately 9:30 p.m., crushing chest  pain radiating to the left arm, associated with shortness of breath and  diaphoresis.  Chest pain lasted until the patient arrived at the ER, of  which nitroglycerin helped to relieve the chest pain, however, did not  completely get rid of the chest pain.  Shortness of breath was relieved with  O2.  No prior illness to this episode.  The patient has had a lot of stress  lately over finances and had to file for bankruptcy today and feels that the  chest pain was brought on by the stress.  The patient is afebrile -- no  nausea, vomiting and diarrhea -- and had a headache earlier today, no vision  change; headache improved with the nitroglycerin.   REVIEW OF SYSTEMS:  Review of systems positive for recent suicidal ideation,  which the patient denies at this time.  She also reports having a left foot  drop that she has had for years.  Otherwise, review of systems is negative.   ALLERGIES:  No known drug allergies.   MEDICATIONS:  1. Atenolol 100 mg p.o. daily.  2. Hydrochlorothiazide 25 mg a day.  3. Prinivil 40 mg a day, however, the patient had not been taking any of     these medications for the past 7-8 months.   PAST MEDICAL HISTORY:  1. Colon cancer, status post hemicolectomy in 1997.  2. Anxiety.  3. Depression.  4. Hypertension.  5. Arteriosclerosis.   PRIMARY PHYSICIAN:  Dr. Lorne Skeens.   CARDIOLOGIST:  Dr. Georga Hacking.   PAST SURGICAL HISTORY:  1. Right hemicolectomy in 1997.  2. Cardiac cath, March of 2000, with an ejection fraction of 60%.  3. Colonoscopy, April 2000.  4. Decompression of disk herniation in 1999.  5. Hysterectomy, partial, in 2002.   FAMILY HISTORY:  Mother deceased of breast cancer.  Father deceased in  01-02-03of colon cancer.  Four siblings, 3 with coronary artery  disease.   SOCIAL HISTORY:  Patient lives alone, has a son at [facility] [employer].  The  patient has been complaining of financial problems for some time, no  alcohol, no tobacco.   PHYSICAL EXAMINATION:  VITALS:  Blood pressure 194/128 initially, pulse 117,  respirations 17, 96% on 2 L of O2, temperature 97.9.  GENERAL:  Laughing  and joking with family, alert, in no acute distress.  HEENT:  Pupils equal, round and reactive to light.  No thyromegaly.  No  cervical lymphadenopathy.  Poor dentition.  Oropharynx clear.  CV:  Regular rate and rhythm.  No murmurs, rubs, or gallops.  Distant S1 and  S2.  Extremities cool.  No cyanosis.  No clubbing.  Peripheral pulses 2+.  No JVD.  LUNGS:  Lungs clear to auscultation bilaterally with good respiratory  effort.  ABDOMEN:  Positive bowel sounds.  Soft, nontender and nondistended.  No  organomegaly.  EXTREMITIES:  Upper and lower extremity strength 5/5, equal bilateral to the  patient.  NEUROLOGIC:  Cranial nerves II-XII intact.   LABORATORIES:  Lipase is 27.  BMET:  Sodium 141, potassium 2.8, chloride  108, bicarb 27, BUN 11, creatinine 1.0, glucose 115, total protein 6.6,  calcium of 8.8, albumin 3.6, AST 16, ALT 15, alkaline phosphatase 88, total  bilirubin 0.4.  White blood cell count 7.1, hemoglobin 14.2, hematocrit  41.8, platelets of 288,000.  INR of 0.9, PTT of 25, PT of 12.5.  First set  of cardiac enzymes:  Myoglobin 62.2, CK-MB of 2.3, troponin less than 0.05.   Chest x-ray shows right lower lobe airspace  disease with some interstitial  edema.   EKG showed PVCs with bigeminy.   ASSESSMENT AND PLAN:  [age]-year-old African American female with  chest pain, history of coronary artery disease, anxiety, depression and  hypertension.   1. Chest pain, typical in nature.  Patient on nitroglycerin drip with     improved symptoms.  Chest pain returned while on 3 mcg per minute so the     nitroglycerin drip was increased to 6 mcg per minute.  Considering     history of coronary artery disease with prior stent, the patient will get     serial cardiac enzymes x3 and if enzymes bump, we will consider     cardiology, chest x-ray and 2-D echocardiogram in the a.m.  Fasting lipid     panel pending.  Patient also begun on heparin drip and we will repeat her     EKG in the morning.  2. Hypertension.  Blood pressure is uncontrolled.  We will begin metoprolol     and lisinopril for now.  3. Anxiety and depression.  The patient will likely need counseling or     medications to be determined as an outpatient.  4. Hypokalemia.  Repleted orally with 40 mEq x2 one hour apart.      Anastasio Auerbach, MD                          Leighton Roach McDiarmid, M.D.    AD/MEDQ  D:  09/27/2003  T:  09/30/2003  Job:  [id number]

## 2010-11-27 NOTE — Discharge Summary (Signed)
NAME:  Lindsey Payne, Lindsey Payne                      ACCOUNT NO.:  [id number]   MEDICAL RECORD NO.:  [id number]                   PATIENT TYPE:  INP   LOCATION:  6524                                 FACILITY:  [facility]   PHYSICIAN:  Leighton Roach McDiarmid, M.D.             DATE OF BIRTH:  June 25, 1951   DATE OF ADMISSION:  09/18/2003  DATE OF DISCHARGE:  09/21/2003                                 DISCHARGE SUMMARY   DISCHARGE DIAGNOSES:  1. Hypertensive heart disease.  2. Non-Q-wave myocardial infarction.  3. Left ventricular dysfunction with elevated left ventricular end-diastolic     pressures.  4. Diastolic dysfunction.  5. Mitral regurgitation.  6. Anxiety and depression.  7. Nicotine abuse.  8. Hyperlipidemia.  9. Lumbar disk disease.  10.      History of colon cancer.   DISCHARGE MEDICATIONS:  1. Lisinopril 10 mg daily.  2. Lopressor 25 mg b.i.d.  3. Hydralazine 25 mg b.i.d.  4. Isosorbide Dinitrate 20 mg b.i.d.  5. Lasix 40 mg b.i.d.  6. Norvasc 5 mg b.i.d.  7. Lipitor 40 mg daily.  8. Paxil 20 mg daily.  9. Aspirin 81 mg daily.   FOLLOW UP:  The patient was told to follow up with Dr. Jacklynn Bue in two weeks  and Dr. Donnie Aho in two weeks as well.   SPECIAL INSTRUCTIONS:  Follow a low salt, low fat diet, and work on smoking  cessation.   CONSULTS:  Cardiology.   PROCEDURES:  1. Cardiac catheterization.  2. 2-D echocardiogram.   BRIEF ADMISSION HISTORY:  This is a 60 year old, African-American female,  who presented to the ER with chest pain described as crushing, substernal,  and radiating to her left arm and associated with shortness of breath and  diaphoresis.  The patient was placed on a nitroglycerin drip in the ER,  which did help to relieve her pain, and O2 helped to relieve her shortness  of breath.   The patient's initial blood pressure was 194/128 with a pulse of 117.  The  patient also reported that she was feeling depressed and had recent suicidal  ideations.  The  patient's initial cardiac enzymes were myoglobin 62.2, CK-MB  2.3, and troponin I less than 0.05.  Chest x-ray showed interstitial edema  and right lower lobe airspace disease.  EKG showed PVCs with bigeminy.  Initial BMET showed a sodium of 141, potassium of 2.8, chloride of 108,  bicarb of 27, BUN of 11, creatinine 1.0, glucose of 115, total protein of  6.6, calcium of 8.8, albumin of 3.6, AST of 16, ALT of 15, alkaline  phosphatase of 88, total bilirubin of 0.4.  CBC showed white blood cell  count of 7.1, hemoglobin 14.2, hematocrit 41.8, and platelets of 288.  INR  was 0.9, PTT 25, PT of 12.5.   HOSPITAL COURSE:  1. Chest pain:  Thought to be typical in nature.  Patient again was placed  on nitroglycerin drip in the ER.  She was titrated up to 6 mcg per minute     to relieve pain.  She was also placed on a heparin drip while in the ER     considering the patient did have history of coronary artery disease with     prior stent placement.  Patient's EKG was repeated the morning after     admission without change, and her second set of cardiac enzymes had a CK     of 128, troponin of 0.07, a CK-MB of 2.5.  The third set showed a CK of     121, CK-MB of 2.8, and troponin of 0.08.  A BNP was 498.  The patient had     a lipid profile drawn which showed a total cholesterol of 228,     triglycerides of 132, LDL of 160, and HDL of 42.  For her chest pain, the     patient had a TSH drawn which was 1.237.  Considering the patient's     history of coronary artery disease and persistent chest pain only     relieved by the nitroglycerin drip at 6 mcg per minute, Cardiology was     consulted.  The patient had a 2-D echocardiogram which showed Doppler     evidence of diastolic dysfunction.  The left ventricle was mildly     dilated.  Overall left ventricular systolic function was mildly to     moderately decreased with a left ventricular ejection fraction estimated     at 35 to 45%.  There was  moderate diffuse left ventricular hypokinesis     and left ventricular wall thickness was moderately increased.  There was     mild to moderate mitral valvular regurgitation, and the left atrium was     moderately dilated.  The patient was scheduled for cardiac     catheterization which showed a normal left ventricular function with     global hypokinesis, mild to moderate reduction in left ventricular     ejection fraction with marked increase left ventricular end diastolic     pressure.  Also, coronary artery disease with calcification in the     proximal left anterior descending, but unable to demonstrate a severe     stenosis.  It was determined at that time that patient's echo and     catheterization findings were consistent with hypertensive heart disease     and patient was placed on the medications listed above and taken off of     her nitroglycerin and heparin drip.  Patient did well with this with no     chest pain, no shortness of breath.  Blood pressures were 130 to 150/50     to 60 at discharge with a heart rate in the 60s.  Patient was felt to be     stable for discharge at that time.  She was told to follow up with Dr.     Donnie Aho in two weeks, and patient was given some financial assistance with     purchasing immediate meds after discharge but was told to discuss with     her primary care physician options for long-term medication assistance.  2. Hypertension:  Patient definitely had uncontrolled hypertension before     admission.  She had not taken any of her medications that she was     prescribed in the past eight months before admission.  Again, blood     pressure is  controlled on the medications listed above before discharge.  3. Anxiety and depression:  Patient stated that she had recent thoughts of     suicidal ideation before admission.  She denied any thoughts of suicide    at the time of admission but stated that she was under a lot of stress     and thought that  she could use some help.  Patient was placed on Paxil 20     mg daily before discharge and was told to continue that as an outpatient     and would likely benefit from counseling; however, patient denied wanting     counseling at the time of hospitalization.  4. Hypokalemia:  On admission, patient's potassium was 2.8.  She was     repleted orally with a total of 80 mEq of KCl.  The patient's potassium     came up appropriately to 3.8.  However, on the day of discharge it was     3.2, and her potassium should be followed up as an outpatient considering     the patient started on Lasix the day prior to discharge and she may need     supplementation.  5. Hyperlipidemia - The patient's lipid panel is as reported above.  The     patient was begun on Lipitor 40 mg daily.   OTHER LABS AT DISCHARGE:  CBC:  White blood cell count 6.0, hemoglobin 11.7,  hematocrit 34.9, MCV of 82.5, RDW 13.2 and platelets of 240.      Anastasio Auerbach, MD                          Leighton Roach McDiarmid, M.D.    AD/MEDQ  D:  10/08/2003  T:  10/09/2003  Job:  [id number]   cc:   Lorne Skeens, D.O.  [facility].  [address] Resident  [place], [place] [address]  Fax: [phone]   W. Ashley Royalty., M.D.  [address], [address]  [place]  [place] [address]  Fax: [phone]

## 2010-11-27 NOTE — Consult Note (Signed)
NAME:  Lindsey Payne, Lindsey Payne NO.:  [id number]   MEDICAL RECORD NO.:  [id number]                   PATIENT TYPE:  INP   LOCATION:  6524                                 FACILITY:  [facility]   PHYSICIAN:  W. Ashley Royalty., M.D.         DATE OF BIRTH:  [birth year]   DATE OF CONSULTATION:  09/19/2003  DATE OF DISCHARGE:                                   CONSULTATION   Thank you for asking me to see this 60 year old female for evaluation of  chest discomfort, abnormal troponin, and pulmonary edema.  The patient has a  longstanding history of hypertension, as well as hyperlipidemia.  She was  evaluated with exertional angina in 2000 and had an abnormal Cardiolite  scan.  Cardiac catheterization at that time showed severe hypertension.  She  had a heavily calcified LAD in the proximal portion with moderate to severe  ostial left anterior descending stenosis noted.  There was minimal disease  present in the other vessels.  She was lost to followup following this and  we had planned to consider internal mammary bypass grafting versus  rotational atherectomy.  She has not had much in the way of chest  discomfort, but has been noncompliant with her medical regimen since that  period of time.  She does not take blood pressure medicines on a regular  basis, except for maybe diuretics, but does not have exertional chest  discomfort.  She has been under significant situation stress and was in the  process of filing for bankruptcy.  Last evening, she developed severe  tightness in her chest, radiating into her left arm, and accompanied with  severe shortness of breath.  She was brought to the emergency room by EMS.  She was found to be severe hypertensive and started on IV nitroglycerin, as  well as heparin.  She was given aspirin and a low dose of beta blockers and  has remained hypertensive.  She has continued to cough and has vomited  somewhat since that time.  Initial  cardiac enzymes were negative, but a  subsequent troponin returned slightly elevated at 0.07.  Chest x-ray was  read as showing diffuse interstitial edema.  She has been under significant  situational distress that has been a problem for her.  The onset of the  discomfort was around 9:30 p.m. last evening and persists to a mild degree  now.  She continues to smoke less than one-half packs of cigarettes per day.   PAST MEDICAL HISTORY:  Remarkable for longstanding hypertension.  She also  has hyperlipidemia which is not currently treated.  She has no history of  diabetes, but does have mild obesity.  She has severe lumbar disk disease  previously.   PAST SURGICAL HISTORY:  1. C-section.  2. Hysterectomy.  3. Lumbar laminectomy.  4. Colectomy previously for colon cancer.   ALLERGIES:  None.   MEDICATIONS:  Unknown diuretic.   FAMILY HISTORY:  Mother  died of breast cancer.  Father died of colon cancer.  Also had a history of coronary artery disease.  She has two brothers and two  sisters, three of which have coronary artery disease.   SOCIAL HISTORY:  She has been a widow since 12.  She is currently disabled  due to severe low back pain and has been in litigation involving an injury  while she was at work.  She smokes less than one-half packs of cigarettes  per day.  She states significant financial difficulties and may have to file  for bankruptcy.  She has one son.   REVIEW OF SYSTEMS:  She remains obese.  She gets little in the way of  regular exercise.  She has no definite ears, nose, and throat complaints.  She does not normally have significant dyspnea and denies cough or wheezing  normally.  Denies any history of ulcers or dyspepsia.  She has no history of  urgency, frequency, or incontinence.  She has severe low back pain and has  been under treatment at the pain clinic previously with this.  She has been  under significant situation stress at home and due to financial  resources.  She has no history of TIA or stroke.  Other than as noted above, the review  of systems is unremarkable.   PHYSICAL EXAMINATION:  GENERAL APPEARANCE:  She is a pleasant female who is  coughing, but appears comfortable and in no acute distress.  VITAL SIGNS:  Her pulse is currently 110 and regular with occasional PVCs.  The blood pressure is 180/117.  SKIN:  Warm.  HEENT:  PERRLA.  CNS clear.  The fundi were not examined.  Her pharynx was  negative.  NECK:  Supple without masses.  There is slight jugular venous distention  noted.  There are no bruits noted.  LUNGS:  Basilar rales.  CARDIAC:  Normal S1 and S2 with an S4 present.  No definite murmur.  ABDOMEN:  Soft and nontender.  No masses, hepatosplenomegaly, or aneurysm.  EXTREMITIES:  Femoral pulses are 2+.  Peripheral pulses are 2+ without  edema.   LABORATORY DATA:  The 12-lead EKG shows minor nonspecific changes.  The  chest x-ray shows cardiomegaly with some mild interstitial congestion.  The  CBC was normal.  The potassium was 2.8 on admission and has been repleted.  Her cholesterol was slightly elevated.  Initial CPKs were negative.  Troponin was slightly elevated at 0.07.  A BNP level was not drawn and is  pending at the time of dictation.   IMPRESSION:  1. Acute shortness of breath with pulmonary congestion and features     suggestive of unstable angina with mildly elevated troponin.  This could     be a hypertensive crisis with pulmonary edema or could be ischemically     mediated.  2. Coronary artery disease with previous ostial left anterior descending     disease.  3. Hyperlipidemia untreated.  4. Hypertension poorly controlled.  5. Cigarette abuse.  6. Lumbar disk disease.  7. History of colon cancer.   RECOMMENDATIONS:  The patient has previously been noncompliant with her  medical regimen.  She is severely hypertensive on admission.  I would recommend that she continue to have myocardial  infarction ruled out.  She  appears to have pulmonary congestion at the present time and should receive  Lasix, as well as a BNP level.  I would also use labetalol to try to help  with the tachycardia,  as well as to control blood pressure, and will try to  bring her blood pressure under control.  She will need to have an  echocardiogram.  Following review of the above studies, she may need to have  a repeat catheterization.   Thank you for asking me to see her with you.                                               Darden Palmer., M.D.    WST/MEDQ  D:  09/19/2003  T:  09/19/2003  Job:  [id number]   cc:   [facility]

## 2010-11-27 NOTE — Discharge Summary (Signed)
Lindsey Payne, HANCHER NO.:  [id number]   MEDICAL RECORD NO.:  [id number]          PATIENT TYPE:  INP   LOCATION:  2627                         FACILITY:  [facility]   PHYSICIAN:  Leighton Roach McDiarmid, M.D.DATE OF BIRTH:  [birth year]   DATE OF ADMISSION:  08/25/2005  DATE OF DISCHARGE:  08/27/2005                                 DISCHARGE SUMMARY   DISCHARGE DIAGNOSES:  1.  Chest pain.  2.  Hypertension.  3.  Tobacco abuse.  4.  Depression.   DISCHARGE MEDICATIONS:  1.  Lasix 40 mg p.o. twice daily.  2.  K-Dur 40 mEq p.o. daily.  3.  Caduet 5/40 mg p.o. daily.  4.  Plavix 75 mg p.o. daily.  5.  Aspirin 81 mg p.o. daily.  6.  Hydrochlorothiazide 25 mg p.o. daily.  7.  Paxil 20 mg p.o. daily.  8.  Metoprolol 25 mg p.o. twice daily.  9.  Hydralazine 50 mg p.o. twice daily.  10. Isosorbide dinitrate 20 mg p.o. 3 times daily.  11. Chantix 0.5 mg p.o. daily.   BRIEF HISTORY OF PRESENT ILLNESS:  Lindsey Payne is a 60 year old black female  with known hypertensive heart disease, hyperlipidemia and cigarette smoking,  who presented with a 2-day history of substernal chest pressure brought on  by exertion.  Her EKG did not have any new findings and her cardiac enzymes  were negative.   ADMISSION LABORATORY DATA:  CK 198, CK-MB 2.1, troponin I 0.01.  White blood  cell count 6.0, hemoglobin 13.9, hematocrit 41.1, platelets 289,000.  Sodium  141, potassium 2.9, BUN 9, creatinine 1.2.   HOSPITAL COURSE:  PROBLEM #1 - CHEST PAIN:  The patient's chest pain was  consistent with unstable angina, so she was started on heparin and we called  her cardiologist, Dr. Donnie Aho for evaluation.  The patient's last cath had  been in March of 2005 and she had had never had any stents placed.  The  patient was cathed the morning after admission and the findings included a  calcified proximal LAD with mild proximal stenosis involving the left  anterior descending artery as well as concentric  left ventricular  hypertrophy and an EF of 45% to 50%.  The patient was started on Plavix here  in the hospital after her heart catheterization.  The patient's chest pain  had improved while she was here in the hospital and she was discharged home  chest-pain-free with vital signs.   PROBLEM #2 - HYPERTENSION:  The patient's blood pressures remained well-  controlled on her home regimen while she was here.   PROBLEM #3 - DEPRESSION:  The patient was continued on her Paxil while she  was here in the hospital.  She noted that she had been out of her depression  medication for 1 month.  She was given prescriptions for all of her  medicines when she was discharged.   PROBLEM #4 - TOBACCO ABUSE:  The patient expressed willingness to quit.  We  started her on Chantix 0.5 mg daily on day of discharge.  She will need this  increased to 1 mg  daily next week and then 1 mg twice daily after that.  This will be managed by her primary doctor.   DISCHARGE INSTRUCTIONS:  1.  She is to follow up with Dr. Raquel James, September 02, 2005 at 9 a.m.  2.  She is to follow up with Dr. Donnie Aho and call him for an appointment.  3.  She is to adhere to a heart-healthy diet.  4.  She was advised to stop smoking.      Altamese Cabal, M.D.    ______________________________  Leighton Roach McDiarmid, M.D.    KS/MEDQ  D:  08/29/2005  T:  08/30/2005  Job:  [id number]   cc:   Ursula Beath, MD  Fax: [phone]   W. Viann Fish, M.D.  Fax: [phone]  Email: [email or web address]

## 2010-11-27 NOTE — Cardiovascular Report (Signed)
NAMEJOURNII, NIERMAN NO.:  [id number]   MEDICAL RECORD NO.:  [id number]          PATIENT TYPE:  INP   LOCATION:  2627                         FACILITY:  [facility]   PHYSICIAN:  Georga Hacking, M.D.DATE OF BIRTH:  1950/08/28   DATE OF PROCEDURE:  08/26/2005  DATE OF DISCHARGE:                              CARDIAC CATHETERIZATION   HISTORY:  The patient is a 60 year old black female with known hypertensive  heart disease, cigarette smoking, and hyperlipidemia.  She presented with  chest discomfort with some atypical features to it, but stated that it had  been worse with exertion the past two days prior to admission.  Catheterization was done to assess her cardiac situation.   COMMENTS ABOUT PROCEDURE:  The right femoral artery was entered using a  single anterior needle wall stick.  Catheterization was done using 6 French  catheters.  The right coronary artery is best selected with the right  coronary bypass graft catheter to avoid damping.  She was given Versed 2 mg  prior to the procedure and nitroglycerin sublingual during the procedure and  tolerated it well.   HEMODYNAMIC DATA:  Aorta post contrast 102/61, LV postcontrast 102/6 to 10.   ANGIOGRAPHIC DATA:  Left ventriculogram:  Performed in the 30 degrees RAO  projection.  There are increased trabeculations compatible with left  ventricular hypertrophy.  The estimated ejection fraction is 45-50%.  Coronary arteries arise and distribute normally.  There is calcification  noted involving the proximal left coronary artery.  The left main coronary artery appears normal.  The left anterior descending exits the left main at almost a right angle and  then has a sharp bend.  There is calcification on the inferior surface of  the artery.  Multiple views were taken of this and the vessel appears to  have only a mild proximal stenosis.  In the spider view, there appears to be  mark affects that may account for what  appears to be more a severe view than  that.  The distal vessel has mild to moderate irregularity.  The circumflex coronary artery:  Large marginal branch and intermediate  branch which are free of disease.  Right coronary artery:  There was significant damping associated proximally.  There is a right atrial branch as well as a conus branch that arise  proximally.  The ostium of the right coronary appears to be 40-50% narrowed  but is not significantly narrowed.  A series of the several posterior  descending and posterolateral branches were seen which were tortuous but no  significant disease is noted distally.   IMPRESSION:  1.  Calcified proximal LAD with what appears to be mild proximal stenosis      involving the left anterior descending.  2.  Concentric LVH with some interval improvement in EF since last study.   RECOMMENDATIONS:  Continue intensive medical therapy, discontinuation of  smoking, continue Plavix.  Outpatient stress test to look for presence of  ischemia.      Georga Hacking, M.D.  Electronically Signed     WST/MEDQ  D:  08/26/2005  T:  08/26/2005  Job:  [id number]   cc:   Leighton Roach McDiarmid, M.D.  Fax: [phone]

## 2010-11-27 NOTE — Cardiovascular Report (Signed)
NAME:  Lindsey Payne, Lindsey Payne                      ACCOUNT NO.:  [id number]   MEDICAL RECORD NO.:  [id number]                   PATIENT TYPE:  INP   LOCATION:  6524                                 FACILITY:  [facility]   PHYSICIAN:  W. Ashley Royalty., M.D.         DATE OF BIRTH:  Apr 03, 1951   DATE OF PROCEDURE:  09/20/2003  DATE OF DISCHARGE:  09/21/2003                              CARDIAC CATHETERIZATION   PROCEDURES PERFORMED:  1. Left heart catheterization with,  2. Coronary angiogram and,  3. Left ventriculogram.   CARDIOLOGIST:  Georga Hacking, M.D.   INDICATIONS:  [age]-year-old female with congestive heart failure,  pulmonary edema and unstable angina.   COMMENTS ABOUT PROCEDURE:  The patient tolerated the procedure well without  complications.  The ostium of the LAD was very difficult to lay out and  multiple projections were used, which showed calcification there, but in the  best views I could not demonstrate a severe stenosis noted.  There were two  side branches that came off the proximal right coronary artery that were  difficult to lay out and select.  There was not felt to be severe disease  there.  She receives labetalol 20 mg for control of hypertension at the end  of the procedure.   HEMODYNAMIC DATA:  Aorta post contrast; 162/107, mean equals 130.  LV post  contrast; 162/0-35-40.   ANGIOGRAPHIC DATA:  Left Ventriculogram:  Left ventriculogram was performed  in the 30-degree RAO projection.  The aortic valve was normal.  The mitral  valve has mitral regurgitation.  The left ventricle was mildly dilated.  There was global hypokinesis noted with an estimated ejection fraction of  40%.   Coronary arteries arise and distribute normal.  Left Main Coronary Artery:  The left main coronary artery is normal.  There  is calcification noted in the proximal LAD.   Left Anterior  Descending:  The left anterior descending is calcified  proximally.  In the best  views seen there is a right angle takeoff of the  LAD with tortuosity, but I was not able to demonstrate a severe stenosis.   Circumflex Artery:  The circumflex has an intermediate branch and contains  only mild irregularity.   Right Coronary Artery:  The right coronary artery has a 30% proximal  stenosis, which is not calcified with two side branches that come off  proximally.  The distal posterior descending and posterolateral branches are  tortuous.   IMPRESSION:  1. Abnormal left ventricular function with global hypokinesis, mild-to-     moderate reduction in left ventricular ejection fraction with marked     increased left ventricular end diastolic pressure.  2. Coronary artery disease with calcification in the proximal left anterior     descending, but unable to demonstrate a severe stenosis.   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  She has increased LVEDP.  It is recommended that she have  good  blood pressure control as she has been noncompliant with her medications.  Would recommend the addition of hydralazine and nitrates as well as calcium  channel blockers, beta blockers and ACE inhibitors.                                               Darden Palmer., M.D.    WST/MEDQ  D:  09/20/2003  T:  09/21/2003  Job:  [id number]   cc:   Asencion Partridge, M.D.  Fax: [phone]   Lorne Skeens, D.O.  [facility].  [facility] [address] Resident  [place], [place] [address]  Fax: [phone]

## 2010-12-21 ENCOUNTER — Other Ambulatory Visit: Payer: Self-pay | Admitting: Family Medicine

## 2010-12-21 NOTE — Telephone Encounter (Signed)
Refill request

## 2010-12-28 ENCOUNTER — Ambulatory Visit (INDEPENDENT_AMBULATORY_CARE_PROVIDER_SITE_OTHER): Payer: Medicare Other | Admitting: Family Medicine

## 2010-12-28 DIAGNOSIS — F17200 Nicotine dependence, unspecified, uncomplicated: Secondary | ICD-10-CM

## 2010-12-28 DIAGNOSIS — I10 Essential (primary) hypertension: Secondary | ICD-10-CM

## 2010-12-29 NOTE — Progress Notes (Signed)
  Subjective:    Patient ID: Lindsey Payne, female    DOB: [birth year], 60 y.o.   MRN: [id number]  HPI  1) HTN - BP 145/77 today. Denies chest pain, dyspnea, LE edema, headache.  No side effects from medications. On Bidil, spironolactone, lasix, norvasc, lisinopril, metoprolol. Still does not monitor salt. Walks > 30 minutes every day with dog (sometimes twice a day). Weight up 4 lbs since last visit. Has eliminated fried foods, increased fresh vegetables, fiber.  2) Tobacco abuse - Was at 1 cigarette per day by gradually cutting back. Had increased to 3-4 a day due to increased stress. Now back to 2 per day. Uses SABA once a day before bed. Denies worsening dyspnea, cough, wheeze, fever, hemoptysis, chest pain. Contemplative about quitting all the way. Has quit before for a max of 5 months, but started up again when she was around smokers. Tries to distract herself with chores or TV when she feels like smoking.   Pertinent past history reviewed.   Review of Systems As per HPI     Objective:   Physical Exam General:  Well appearing woman in NAD,overweight. Vitals reviewed  Lungs:  Normal respiratory effort, chest expands symmetrically. Lungs are clear to auscultation, no crackles or wheezes. Heart:  Normal rate and regular rhythm. S1 and S2 normal without gallop, murmur, click, rub or other extra sounds. No JVD.  Neurologic:  alert & oriented X3.  cranial nerves II-XII intact.      Assessment & Plan:

## 2010-12-29 NOTE — Assessment & Plan Note (Signed)
Contemplative about quitting all the way. Advised to find healthy replacements for smoking - we specifically discussed exercise as one such replacement. Follow up with PCP in three months. Does not wish to start nicotine replacement or other medical therapy. Interested in starting cessation class - she has the information regarding this.

## 2010-12-29 NOTE — Assessment & Plan Note (Addendum)
Continue current medications. Discussed DASH diet as before. Advised continued and increasing activity. Follow up in three months.

## 2011-01-11 ENCOUNTER — Other Ambulatory Visit: Payer: Self-pay | Admitting: Family Medicine

## 2011-01-11 DIAGNOSIS — Z1231 Encounter for screening mammogram for malignant neoplasm of breast: Secondary | ICD-10-CM

## 2011-01-23 ENCOUNTER — Other Ambulatory Visit: Payer: Self-pay | Admitting: Family Medicine

## 2011-01-24 NOTE — Telephone Encounter (Signed)
Refill request

## 2011-02-11 ENCOUNTER — Ambulatory Visit
Admission: RE | Admit: 2011-02-11 | Discharge: 2011-02-11 | Disposition: A | Discharge status: Home / Self Care | Payer: Medicare Other | Source: Ambulatory Visit | Attending: Family Medicine | Admitting: Family Medicine

## 2011-02-11 DIAGNOSIS — Z1231 Encounter for screening mammogram for malignant neoplasm of breast: Secondary | ICD-10-CM

## 2011-02-26 ENCOUNTER — Other Ambulatory Visit: Payer: Self-pay | Admitting: Family Medicine

## 2011-02-26 NOTE — Telephone Encounter (Signed)
Refill request

## 2011-03-09 ENCOUNTER — Other Ambulatory Visit: Payer: Self-pay | Admitting: Family Medicine

## 2011-03-09 MED ORDER — SPIRONOLACTONE 25 MG PO TABS: 25.0000 mg | ORAL_TABLET | Freq: Every day | ORAL | Status: DC

## 2011-03-27 ENCOUNTER — Other Ambulatory Visit: Payer: Self-pay | Admitting: Family Medicine

## 2011-03-28 NOTE — Telephone Encounter (Signed)
Refill request

## 2011-03-30 ENCOUNTER — Ambulatory Visit: Payer: Medicare Other | Admitting: Family Medicine

## 2011-03-31 ENCOUNTER — Ambulatory Visit (INDEPENDENT_AMBULATORY_CARE_PROVIDER_SITE_OTHER): Payer: Medicare Other | Admitting: Family Medicine

## 2011-03-31 ENCOUNTER — Other Ambulatory Visit: Payer: Self-pay | Admitting: Family Medicine

## 2011-03-31 ENCOUNTER — Encounter: Payer: Self-pay | Admitting: Family Medicine

## 2011-03-31 VITALS — BP 124/74 | HR 72 | Temp 98.0°F | Wt 206.0 lb

## 2011-03-31 DIAGNOSIS — I10 Essential (primary) hypertension: Secondary | ICD-10-CM

## 2011-03-31 DIAGNOSIS — F17200 Nicotine dependence, unspecified, uncomplicated: Secondary | ICD-10-CM

## 2011-03-31 DIAGNOSIS — Z86010 Personal history of colonic polyps: Secondary | ICD-10-CM

## 2011-03-31 LAB — BASIC METABOLIC PANEL
BUN: 12 mg/dL (ref 6–23)
CO2: 26 mEq/L (ref 19–32)
Chloride: 105 mEq/L (ref 96–112)
Creat: 1.07 mg/dL (ref 0.50–1.10)

## 2011-03-31 NOTE — Telephone Encounter (Signed)
Refill request

## 2011-03-31 NOTE — Progress Notes (Signed)
  Subjective:    Patient ID: Lindsey Payne, female    DOB: 10/30/1950, 60 y.o.   MRN: [id number]  HPI  HTN- takes BP at [facility] and BPs have been below 140/90 each time.  Taking meds as perscribed.  Continues to walk 1 mile daily.  No CP, SOB or HA.    Smoking 3 cigs/day.  The one at night is not every night and she thinks that would be easiest to quit.  She agrees to start there and plans to try to quit.  Weight- up slightly since last visit.  She snacks at night. Continues to walk.    Review of Systems    Denies CP, SOB, HA, N/V/D, fever  Objective:   Physical Exam Vital signs reviewed General appearance - alert, well appearing, and in no distress and oriented to person, place, and time Heart - normal rate, regular rhythm, normal S1, S2, no murmurs, rubs, clicks or gallops Chest - clear to auscultation, no wheezes, rales or rhonchi, symmetric air entry, no tachypnea, retractions or cyanosis Abdomen - soft, nontender, nondistended, no masses or organomegaly Extremities - peripheral pulses normal, no pedal edema, no clubbing or cyanosis        Assessment & Plan:  HYPERTENSION, BENIGN SYSTEMIC At goal.  Reviewed meds.  No changes. Check BMET today for K  TOBACCO ABUSE Pt will try to drop nighttime cigarette.  She will see suzanne lineberry for help with motivational interviewing in one month to follow up  TUBULOVILLOUS ADENOMA, COLON, HX OF Last colonoscopy was 3 years ago.  Due in 2 years

## 2011-03-31 NOTE — Assessment & Plan Note (Signed)
Last colonoscopy was 3 years ago.  Due in 2 years

## 2011-03-31 NOTE — Assessment & Plan Note (Signed)
Pt will try to drop nighttime cigarette.  She will see suzanne lineberry for help with motivational interviewing in one month to follow up

## 2011-03-31 NOTE — Assessment & Plan Note (Signed)
At goal.  Reviewed meds.  No changes. Check BMET today for K

## 2011-04-20 LAB — CBC
Hemoglobin: 11.6 — ABNORMAL LOW
MCHC: 34
MCV: 81.9
RBC: 4.16
RDW: 14.1

## 2011-04-20 LAB — DIFFERENTIAL
Basophils Absolute: 0
Basophils Relative: 0
Eosinophils Absolute: 0 — ABNORMAL LOW
Monocytes Absolute: 1.6 — ABNORMAL HIGH
Monocytes Relative: 11
Neutrophils Relative %: 83 — ABNORMAL HIGH

## 2011-04-20 LAB — I-STAT 8, (EC8 V) (CONVERTED LAB)
Chloride: 101
HCT: 38
pCO2, Ven: 34.4 — ABNORMAL LOW
pH, Ven: 7.443 — ABNORMAL HIGH

## 2011-04-20 LAB — URINALYSIS, ROUTINE W REFLEX MICROSCOPIC
Glucose, UA: NEGATIVE
Hgb urine dipstick: NEGATIVE
Protein, ur: 100 — AB

## 2011-04-20 LAB — URINE MICROSCOPIC-ADD ON

## 2011-04-24 ENCOUNTER — Other Ambulatory Visit: Payer: Self-pay | Admitting: Family Medicine

## 2011-04-25 NOTE — Telephone Encounter (Signed)
Refill request

## 2011-04-27 ENCOUNTER — Other Ambulatory Visit: Payer: Self-pay | Admitting: Family Medicine

## 2011-04-27 NOTE — Telephone Encounter (Signed)
Refill request

## 2011-05-23 ENCOUNTER — Other Ambulatory Visit: Payer: Self-pay | Admitting: Family Medicine

## 2011-05-24 ENCOUNTER — Other Ambulatory Visit: Payer: Self-pay | Admitting: Family Medicine

## 2011-05-24 NOTE — Telephone Encounter (Signed)
Refill request

## 2011-05-24 NOTE — Telephone Encounter (Signed)
Refill request for patient of Dr. Hulen Luster

## 2011-07-01 ENCOUNTER — Ambulatory Visit (INDEPENDENT_AMBULATORY_CARE_PROVIDER_SITE_OTHER): Payer: Medicare Other | Admitting: Family Medicine

## 2011-07-01 ENCOUNTER — Encounter: Payer: Self-pay | Admitting: Family Medicine

## 2011-07-01 VITALS — BP 108/63 | HR 79 | Temp 98.2°F | Ht 69.0 in | Wt 205.0 lb

## 2011-07-01 DIAGNOSIS — F329 Major depressive disorder, single episode, unspecified: Secondary | ICD-10-CM

## 2011-07-01 DIAGNOSIS — E785 Hyperlipidemia, unspecified: Secondary | ICD-10-CM

## 2011-07-01 DIAGNOSIS — I10 Essential (primary) hypertension: Secondary | ICD-10-CM

## 2011-07-01 DIAGNOSIS — I5022 Chronic systolic (congestive) heart failure: Secondary | ICD-10-CM

## 2011-07-01 DIAGNOSIS — F17200 Nicotine dependence, unspecified, uncomplicated: Secondary | ICD-10-CM

## 2011-07-01 LAB — LIPID PANEL: HDL: 38 mg/dL — ABNORMAL LOW (ref >39)

## 2011-07-01 NOTE — Assessment & Plan Note (Signed)
Only smoking one cigarette per day. Doing well. Encouraged her to continue her smoking cessation efforts

## 2011-07-01 NOTE — Assessment & Plan Note (Signed)
Mood is very good today. Continue Paxil.

## 2011-07-01 NOTE — Patient Instructions (Signed)
I will call you if we need to change your medicine If things look good, you will get a letter  Today everything looks great!  Keep up the good work on the smoking! That is fabulous

## 2011-07-01 NOTE — Assessment & Plan Note (Signed)
Last lipid check one year ago. We'll recheck today. We'll increase Crestor if necessary.

## 2011-07-01 NOTE — Assessment & Plan Note (Signed)
Well-controlled today continue current management

## 2011-07-01 NOTE — Assessment & Plan Note (Signed)
Sees Dr. Donnie Aho. Euvolemic. Doing well on her current management

## 2011-07-01 NOTE — Progress Notes (Signed)
  Subjective:    Patient ID: Lindsey Payne, female    DOB: [birth year], 60 y.o.   MRN: [id number]  HPI  Patient presents today for followup of hypertension, depression, hyperlipidemia, heart failure Hypertension-patient's blood pressure is well-controlled today. She is taking her medicines as prescribed. No medicine change recently. She has not checked her blood pressure at home. She denies headache, shortness of breath, chest pain Depression-patient mood has been very good. She is taking her Paxil and thinks it works well. She is spending but Christmas holiday with her family and is looking forward to that. Hyperlipidemia-patient takes Crestor daily. She denies any muscle aches or problems with the medicine. Her cholesterol has not been checked in one year. Heart failure-patient sees Dr. Donnie Aho for management. She is on Lasix twice a day. She feels as this helps her and she is not having any swelling or shortness of breath. Her weight has been stable.  Review of Systems See above    Objective:   Physical Exam Vital signs reviewed General appearance - alert, well appearing, and in no distress and oriented to person, place, and time Very happy and enthusiastic Chest - clear to auscultation, no wheezes, rales or rhonchi, symmetric air entry, no tachypnea, retractions or cyanosis Heart - normal rate, regular rhythm, normal S1, S2, no murmurs, rubs, clicks or gallops Abdomen - soft, nontender, nondistended, no masses or organomegaly Extremities - peripheral pulses normal, no pedal edema, no clubbing or cyanosis        Assessment & Plan:

## 2011-08-23 ENCOUNTER — Other Ambulatory Visit: Payer: Self-pay | Admitting: Family Medicine

## 2011-08-23 NOTE — Telephone Encounter (Signed)
Refill request

## 2011-08-24 ENCOUNTER — Other Ambulatory Visit: Payer: Self-pay | Admitting: Family Medicine

## 2011-08-24 NOTE — Telephone Encounter (Signed)
Refill request

## 2011-09-23 ENCOUNTER — Other Ambulatory Visit: Payer: Self-pay | Admitting: Family Medicine

## 2011-09-23 ENCOUNTER — Other Ambulatory Visit: Payer: Self-pay | Admitting: Cardiology

## 2011-09-23 NOTE — Telephone Encounter (Signed)
Refill request

## 2011-10-27 ENCOUNTER — Ambulatory Visit (INDEPENDENT_AMBULATORY_CARE_PROVIDER_SITE_OTHER): Payer: Medicare Other | Admitting: Family Medicine

## 2011-10-27 ENCOUNTER — Encounter: Payer: Self-pay | Admitting: Family Medicine

## 2011-10-27 VITALS — BP 111/68 | HR 71 | Temp 98.9°F | Ht 69.0 in | Wt 204.0 lb

## 2011-10-27 DIAGNOSIS — E785 Hyperlipidemia, unspecified: Secondary | ICD-10-CM

## 2011-10-27 DIAGNOSIS — F329 Major depressive disorder, single episode, unspecified: Secondary | ICD-10-CM

## 2011-10-27 DIAGNOSIS — F17200 Nicotine dependence, unspecified, uncomplicated: Secondary | ICD-10-CM

## 2011-10-27 DIAGNOSIS — I10 Essential (primary) hypertension: Secondary | ICD-10-CM

## 2011-10-27 LAB — BASIC METABOLIC PANEL
CO2: 24 mEq/L (ref 19–32)
Glucose, Bld: 92 mg/dL (ref 70–99)
Potassium: 4.4 mEq/L (ref 3.5–5.3)
Sodium: 140 mEq/L (ref 135–145)

## 2011-10-27 MED ORDER — ZOSTER VACCINE LIVE 19400 UNT/0.65ML ~~LOC~~ SOLR: 0.6500 mL | Freq: Once | SUBCUTANEOUS | Status: AC

## 2011-10-27 MED ORDER — TETANUS-DIPHTH-ACELL PERTUSSIS 5-2.5-18.5 LF-MCG/0.5 IM SUSP: 0.5000 mL | Freq: Once | INTRAMUSCULAR | Status: DC

## 2011-10-27 NOTE — Assessment & Plan Note (Signed)
Patient has not smoked in one month. Encouraged to continue the good work.

## 2011-10-27 NOTE — Assessment & Plan Note (Signed)
BP Readings from Last 3 Encounters:  10/27/11 111/68  07/01/11 108/63  03/31/11 124/74   Blood pressure at goal. No problems with medications. Continue current regimen. Check BMET today.

## 2011-10-27 NOTE — Patient Instructions (Signed)
It was nice to see you today Keep up the good work!  Look at the information on the vaccines.  You definitely want to get the Tdap but you can decide on the shingles vaccine

## 2011-10-27 NOTE — Progress Notes (Signed)
  Subjective:    Patient ID: Lindsey Payne, female    DOB: 12/15/1950, 61 y.o.   MRN: [id number]  HPI Hypertension-patient is taking her medications without problems. She has not checked her blood pressure at home. She denies dizziness, headache, vision change. She denies shortness of breath or chest pain. She denies symptoms of orthostatic hypotension. Patient is currently walking for exercise.  Depression-patient is feeling much improved on her medications. She is looking for part-time work to keep her busy. She feels very supported by her son.  Hyperlipidemia-patient is taking her Crestor with no problems. Her last blood test was at goal. She denies symptoms as above. She denies any abdominal pain or muscle pain.  Tobacco use-patient has not smoked in one month. She is using walking and looking for a job to keep her busy. She thinks this helps her stop smoking.  Patient is a former smoker.   Review of Systems See above    Objective:   Physical Exam Vital signs reviewed General appearance - alert, well appearing, and in no distress Heart - normal rate, regular rhythm, normal S1, S2, no murmurs, rubs, clicks or gallops Chest - clear to auscultation, no wheezes, rales or rhonchi, symmetric air entry, no tachypnea, retractions or cyanosis Abdomen - soft, nontender, nondistended, no masses or organomegaly Extremities - peripheral pulses normal, no pedal edema, no clubbing or cyanosis        Assessment & Plan:

## 2011-10-27 NOTE — Assessment & Plan Note (Signed)
Last LDL is at goal. Continue current management. Recheck in 6 months.

## 2011-10-27 NOTE — Assessment & Plan Note (Signed)
Doing very well. Staying busy. Continue on current Paxil dose.

## 2011-11-05 ENCOUNTER — Other Ambulatory Visit: Payer: Self-pay | Admitting: Family Medicine

## 2011-12-15 ENCOUNTER — Ambulatory Visit
Admission: RE | Admit: 2011-12-15 | Discharge: 2011-12-15 | Disposition: A | Discharge status: Home / Self Care | Payer: Medicare Other | Source: Ambulatory Visit | Attending: Family Medicine | Admitting: Family Medicine

## 2011-12-15 ENCOUNTER — Encounter: Payer: Self-pay | Admitting: Family Medicine

## 2011-12-15 ENCOUNTER — Ambulatory Visit (INDEPENDENT_AMBULATORY_CARE_PROVIDER_SITE_OTHER): Payer: Medicare Other | Admitting: Family Medicine

## 2011-12-15 VITALS — BP 112/64 | HR 76 | Temp 98.4°F | Ht 69.0 in | Wt 206.0 lb

## 2011-12-15 DIAGNOSIS — M25551 Pain in right hip: Secondary | ICD-10-CM | POA: Insufficient documentation

## 2011-12-15 DIAGNOSIS — M25559 Pain in unspecified hip: Secondary | ICD-10-CM

## 2011-12-15 HISTORY — DX: Pain in right hip: M25.551

## 2011-12-15 MED ORDER — MELOXICAM 15 MG PO TABS: 15.0000 mg | ORAL_TABLET | Freq: Every day | ORAL | Status: DC

## 2011-12-15 NOTE — Patient Instructions (Signed)
I am sorry you're having pain I would like you to go over and get a hip x-ray I will call you when I get the film I am giving you some Mobic, it's like a strong Advil that you take once a day Don't take Advil, Aleve, Motrin with this medicine

## 2011-12-15 NOTE — Assessment & Plan Note (Signed)
Anterior hip pain is acute but without any injury. Consider flare of arthritis pain versus fracture versus muscle strain. Will have patient go for x-rays today. Started Mobic.

## 2011-12-15 NOTE — Progress Notes (Signed)
  Subjective:    Patient ID: Lindsey Payne, female    DOB: 10-May-1951, 61 y.o.   MRN: [id number]  HPI Hip pain-right-sided hip pain starting on Monday. There is no injury. It occurred while she was walking. She is having pain anteriorly. She has not had any fevers, constipation. She has not had any falls.     Review of Systems Vital signs reviewed General appearance - alert, well appearing, and in no distress     Objective:   Physical Exam Gen-uncomfortable appearing, walks with limp MSK-range of motion at hip is limited in internal and external rotation Pearlean Brownie is positive No pain with abduction or adduction. Negative straight leg raise Tender to palpation at the groin       Assessment & Plan:

## 2011-12-17 ENCOUNTER — Telehealth: Payer: Self-pay | Admitting: Family Medicine

## 2011-12-17 NOTE — Telephone Encounter (Signed)
Pt is slightly better on mobic.  xrays show mild hip DJD.  Continue mobic x 2 weeks.  Use heat.  RTC in 2 weeks.  Pt did not want to do PT>

## 2011-12-22 ENCOUNTER — Ambulatory Visit (INDEPENDENT_AMBULATORY_CARE_PROVIDER_SITE_OTHER): Payer: Medicare Other | Admitting: Family Medicine

## 2011-12-22 ENCOUNTER — Telehealth: Payer: Self-pay | Admitting: Family Medicine

## 2011-12-22 ENCOUNTER — Encounter: Payer: Self-pay | Admitting: Family Medicine

## 2011-12-22 VITALS — BP 133/74 | HR 65 | Temp 98.4°F | Ht 69.0 in | Wt 204.6 lb

## 2011-12-22 DIAGNOSIS — M25559 Pain in unspecified hip: Secondary | ICD-10-CM

## 2011-12-22 DIAGNOSIS — M25551 Pain in right hip: Secondary | ICD-10-CM

## 2011-12-22 MED ORDER — KETOROLAC TROMETHAMINE 60 MG/2ML IM SOLN: 60.0000 mg | Freq: Once | INTRAMUSCULAR | Status: DC

## 2011-12-22 MED ORDER — KETOROLAC TROMETHAMINE 60 MG/2ML IM SOLN
60.0000 mg | Freq: Once | INTRAMUSCULAR | Status: AC
  Administered 2011-12-22: 60 mg via INTRAMUSCULAR

## 2011-12-22 NOTE — Assessment & Plan Note (Addendum)
Hip x-ray did not show much arthritis. Unlikely to be intra-abdominal process. Not better with Mobic or diclofenac. Does not want tramadol, Vicodin as she's tried these in the past. Will give Toradol injection today and refer to sports medicine.

## 2011-12-22 NOTE — Telephone Encounter (Signed)
Opened in error

## 2011-12-22 NOTE — Progress Notes (Signed)
  Subjective:    Patient ID: Lindsey Payne, female    DOB: May 21, 1951, 61 y.o.   MRN: [id number]  HPI Patient with continued right hip pain. She has now been using a cane for support. She denies fevers, chills. Denies abdominal pain, diarrhea, nausea, vomiting. She still eating and drinking well. She tried the Mobic as well as her daughter's diclofenac. Neither these things seem to help. She has tried tramadol, Vicodin in the past for pain and she does not want to try these again.   Review of Systems See above    Objective:   Physical Exam Vital signs reviewed General appearance - alert, well appearing, and in no distress MSK- right knee without pain or effusion, mild crepitus Right hip tender to palpation anterior groin. Did not repeat exam today as patient is in wheelchair       Assessment & Plan:

## 2011-12-22 NOTE — Patient Instructions (Addendum)
Please make an appointment at the front desk for sports medicine.  Try to keep as active as you can while limiting pain. Definitely use the cane if it is helping you

## 2011-12-24 ENCOUNTER — Ambulatory Visit (INDEPENDENT_AMBULATORY_CARE_PROVIDER_SITE_OTHER): Payer: Medicare Other | Admitting: Family Medicine

## 2011-12-24 VITALS — BP 115/70 | Ht 69.0 in | Wt 204.0 lb

## 2011-12-24 DIAGNOSIS — M25551 Pain in right hip: Secondary | ICD-10-CM

## 2011-12-24 DIAGNOSIS — M25559 Pain in unspecified hip: Secondary | ICD-10-CM

## 2011-12-24 MED ORDER — PREDNISONE 50 MG PO TABS: ORAL_TABLET | ORAL | Status: DC

## 2011-12-24 MED ORDER — MELOXICAM 15 MG PO TABS: ORAL_TABLET | ORAL | Status: DC

## 2011-12-24 NOTE — Assessment & Plan Note (Addendum)
Exam suggestive of R L5 radiculopathy as well as R femoroacetabular pathology. Will burst with prednisone, meloxicam, cont muscle relaxers. HEP for Low back (she declines formal physical therapy). RTC 4 weeks, MRI L-spine if no better. May also consider diagnostic/therapeutic intra-articular hip joint injection if symptoms are predominantly femoroacetabular at that point.

## 2011-12-24 NOTE — Progress Notes (Signed)
Patient ID: Lindsey Payne, female   DOB: 11/01/1950, 61 y.o.   MRN: [id number] Subjective:   WU:[id number] hip pain  JYN:WGNF patient is a pleasant 61 year old female who has been seen at the family practice center for right hip pain. She localizes her pain in the right-sided lower back, and notes that it radiates around to the right groin, down the anterior aspect of her thigh, knee, anterior, and medial aspect of her right lower leg. It does not go into the foot. It's worse with bending, particularly forward. She's already had back surgery in the distant past, and consisted of an L4-5, and L5-S1 left-sided laminectomy for back pain, which did not relieve much of her pain. Currently she notes that the pain in her right side, leg is worse than back, and is been present since the end of May.  Past medical history:ischemic cardiomyopathy, depression, hyperlipidemia, hypertension. Surgical history: left-sided two-level laminectomy, L4-5, and L5-S1 as above. Family history: positive for heart disease, and hypertension, negative for diabetes. Social history: quit smoking 3 months ago, denies use of alcohol, or drugs. Allergies, and medications reviewed from the medical record and no changes needed.  Review of Systems: No fevers, chills, night sweats, weight loss, chest pain, or shortness of breath.    Objective:  General:  Well Developed, well nourished, and in no acute distress. Neuro:  Alert and oriented x3, extra-ocular muscles intact. Skin: Warm and dry, no rashes noted. Respiratory:  Not using accessory muscles, speaking in full sentences. Musculoskeletal: Back Exam: Inspection: Unremarkable Motion: Flexion 45 deg, Extension 45 deg, Side Bending to 45 deg bilaterally,  Rotation to 45 deg bilaterally SLR laying:  Negative XSLR laying: Negative Palpable tenderness: None FABER: negative Sensory change: Gross sensation intact to all lumbar and sacral dermatomes. Reflexes: 2+ at both patellar  tendons, 1+ at achilles tendons, Babinski's downgoing.  Strength at foot Plantar-flexion: 5/5    Dorsi-flexion: 5/5    Eversion: 5/5   Inversion: 5/5 Leg strength Quad: 5/5   Hamstring: 5/5   Hip flexor: 5/5   Hip abductors: 5/5 Gait unremarkable.  Right Hip: Significant pain with internal rotation of the right hip. I am only able to internally rotate her to approximately 25.   Assessment & Plan:

## 2011-12-26 ENCOUNTER — Emergency Department (HOSPITAL_COMMUNITY)
Admission: EM | Admit: 2011-12-26 | Discharge: 2011-12-26 | Disposition: A | Discharge status: Home / Self Care | Payer: Medicare Other | Attending: Emergency Medicine | Admitting: Emergency Medicine

## 2011-12-26 ENCOUNTER — Encounter (HOSPITAL_COMMUNITY): Payer: Self-pay | Admitting: *Deleted

## 2011-12-26 DIAGNOSIS — R269 Unspecified abnormalities of gait and mobility: Secondary | ICD-10-CM | POA: Insufficient documentation

## 2011-12-26 DIAGNOSIS — R29898 Other symptoms and signs involving the musculoskeletal system: Secondary | ICD-10-CM | POA: Insufficient documentation

## 2011-12-26 DIAGNOSIS — Z79899 Other long term (current) drug therapy: Secondary | ICD-10-CM | POA: Insufficient documentation

## 2011-12-26 DIAGNOSIS — I10 Essential (primary) hypertension: Secondary | ICD-10-CM | POA: Insufficient documentation

## 2011-12-26 DIAGNOSIS — M5416 Radiculopathy, lumbar region: Secondary | ICD-10-CM

## 2011-12-26 DIAGNOSIS — M549 Dorsalgia, unspecified: Secondary | ICD-10-CM | POA: Insufficient documentation

## 2011-12-26 HISTORY — DX: Dorsalgia, unspecified: M54.9

## 2011-12-26 MED ORDER — ONDANSETRON 4 MG PO TBDP
4.0000 mg | ORAL_TABLET | Freq: Once | ORAL | Status: AC
  Administered 2011-12-26: 4 mg via ORAL
  Filled 2011-12-26: qty 1

## 2011-12-26 MED ORDER — OXYCODONE-ACETAMINOPHEN 5-325 MG PO TABS: 1.0000 | ORAL_TABLET | Freq: Four times a day (QID) | ORAL | Status: DC | PRN

## 2011-12-26 MED ORDER — HYDROMORPHONE HCL PF 1 MG/ML IJ SOLN
1.0000 mg | Freq: Once | INTRAMUSCULAR | Status: AC
  Administered 2011-12-26: 1 mg via INTRAMUSCULAR
  Filled 2011-12-26: qty 1

## 2011-12-26 MED ORDER — HYDROMORPHONE HCL PF 2 MG/ML IJ SOLN
2.0000 mg | Freq: Once | INTRAMUSCULAR | Status: AC
  Administered 2011-12-26: 2 mg via INTRAMUSCULAR
  Filled 2011-12-26: qty 1

## 2011-12-26 NOTE — ED Notes (Signed)
Pt reports back pain since may 30, has been seen by family practice and sports medicine, states nothing (pain medication and steriods) are helping. States lower back pain radiates to right lower leg, walks with cane for same.

## 2011-12-26 NOTE — Discharge Instructions (Signed)

## 2011-12-26 NOTE — ED Notes (Signed)
Pt states that she has hx of back surgery and that her back has been hurting for over a month has seen a few drs but nothing has helped

## 2011-12-26 NOTE — ED Provider Notes (Addendum)
History  This chart was scribed for Gwyneth Sprout, MD by Cherlynn Perches. The patient was seen in room STRE1/STRE1. Patient's care was started at 1209.  CSN: [id number]  Arrival date & time 12/26/11  1209   None     Chief Complaint  Patient presents with  . Back Pain    (Consider location/radiation/quality/duration/timing/severity/associated sxs/prior treatment) HPI  Lindsey Payne is a 61 y.o. female who presents to the Emergency Department complaining of 3 weeks of gradually worsening, constant, moderate to severe back pain localized to the lower back and radiating down the right leg with an associated gait problems and weakness of the right leg. Pt states that she has been seen by her PCP and by sports medicine for symptoms, and has been given IV pain medication and PO steroids, but reports that nothing has helped. Pt reports that pain is worse when walking and lying down. Pt states that she not been able to walk well and has been using a cane for symptoms. Pt states that she had back surgery in 1999 and has fallen twice in the past year. Pt reports that she has not had back pain since the surgery. Pt denies left leg pain and upper back pain.    Past Medical History  Diagnosis Date  . Arthritis   . Asthma   . Depression   . Hyperlipidemia   . Hypertension   . Adenoma of large intestine   . Back pain     Past Surgical History  Procedure Date  . Colon surgery     benign tubulovillous adenoma    History reviewed. No pertinent family history.  History  Substance Use Topics  . Smoking status: Former Smoker    Types: Cigarettes    Quit date: 09/21/2011  . Smokeless tobacco: Never Used  . Alcohol Use: Yes     beer occasionally    OB History    Grav Para Term Preterm Abortions TAB SAB Ect Mult Living                  Review of Systems  Constitutional: Negative for fever and chills.  Respiratory: Negative for cough and shortness of breath.     Gastrointestinal: Negative for nausea and vomiting.  Musculoskeletal: Positive for back pain and gait problem.  Skin: Negative for rash.  Neurological: Positive for weakness. Negative for numbness.  All other systems reviewed and are negative.    Allergies  Review of patient's allergies indicates no known allergies.  Home Medications   Current Outpatient Rx  Name Route Sig Dispense Refill  . AMLODIPINE BESYLATE 5 MG PO TABS Oral Take 5 mg by mouth daily.    . ASPIRIN EC 81 MG PO TBEC Oral Take 81 mg by mouth daily.    . FUROSEMIDE 20 MG PO TABS Oral Take 20 mg by mouth 2 (two) times daily.    . ISOSORBIDE DINITRATE 20 MG PO TABS Oral Take 20 mg by mouth 2 (two) times daily.    Marland Kitchen LISINOPRIL 20 MG PO TABS Oral Take 20 mg by mouth daily.    . MELOXICAM 15 MG PO TABS Oral Take 15 mg by mouth daily.    Marland Kitchen METOPROLOL SUCCINATE ER 25 MG PO TB24 Oral Take 25 mg by mouth 2 (two) times daily.    Marland Kitchen PAROXETINE HCL 20 MG PO TABS Oral Take 20 mg by mouth every morning.    Marland Kitchen POTASSIUM CHLORIDE 10 MEQ PO TBCR Oral Take 10 mEq by mouth  2 (two) times daily.      Marland Kitchen PREDNISONE 50 MG PO TABS Oral Take 50 mg by mouth daily.    Marland Kitchen ROSUVASTATIN CALCIUM 10 MG PO TABS Oral Take 10 mg by mouth daily.    Marland Kitchen SPIRONOLACTONE 25 MG PO TABS Oral Take 25 mg by mouth daily.      BP 127/66  Pulse 79  Temp 98.4 F (36.9 C) (Oral)  Resp 16  SpO2 99%  Physical Exam  Nursing note and vitals reviewed. Constitutional: She is oriented to person, place, and time. She appears well-developed and well-nourished. No distress.  HENT:  Head: Normocephalic and atraumatic.  Eyes: EOM are normal. No scleral icterus.  Neck: Neck supple. No tracheal deviation present.  Cardiovascular: Normal rate, regular rhythm and normal heart sounds.        Normal pulses  Pulmonary/Chest: Effort normal and breath sounds normal. No respiratory distress. She has no wheezes. She has no rales.  Abdominal: Soft. There is no tenderness.   Musculoskeletal: Normal range of motion. She exhibits tenderness (mild hip tenderness with movement of right leg).  Neurological: She is alert and oriented to person, place, and time.       Normal sensation and strength in right leg  Skin: Skin is warm and dry.       Midline scar in l-spine region  Psychiatric: She has a normal mood and affect. Her behavior is normal.    ED Course  Procedures (including critical care time)   DIAGNOSTIC STUDIES: Oxygen Saturation is 99% on room air, normal by my interpretation.    COORDINATION OF CARE: 2:03PM - Will try to control pain and give follow up instructions for pain management. Pt agrees with plan.   Labs Reviewed - No data to display No results found.   No diagnosis found.    MDM   Pt with gradual onset of back pain suggestive of radiculopathy.  No neurovascular compromise and no incontinence or retention.  Pt has no infectious sx, hx of CA  or other red flags concerning for pathologic back pain.  Prior fusion years ago but no recent trauma.  Pt has had steroids and anti-inflammatories without improvement.  She just wants pain control till she can f/u with her sports med specialist tomorrow for possible epidural injection.  Pt is able to ambulate but is painful.  Normal strength and reflexes on exam.  Denies trauma. Will give pt pain control and to return for developement of above sx.  3:32 PM Pain is starting to improve and given a second IM dose.  D/ced home to f/u with PCP.     I personally performed the services described in this documentation, which was scribed in my presence.  The  recorded information has been reviewed and considered.   Gwyneth Sprout, MD 12/26/11 1423  Gwyneth Sprout, MD 12/26/11 [phone]

## 2011-12-28 ENCOUNTER — Ambulatory Visit (INDEPENDENT_AMBULATORY_CARE_PROVIDER_SITE_OTHER): Payer: Medicare Other | Admitting: Family Medicine

## 2011-12-28 VITALS — BP 131/70

## 2011-12-28 DIAGNOSIS — M25559 Pain in unspecified hip: Secondary | ICD-10-CM

## 2011-12-28 DIAGNOSIS — M25551 Pain in right hip: Secondary | ICD-10-CM

## 2011-12-28 MED ORDER — OXYCODONE-ACETAMINOPHEN 10-325 MG PO TABS: 1.0000 | ORAL_TABLET | ORAL | Status: DC | PRN

## 2011-12-28 NOTE — Addendum Note (Signed)
Addended by: Monica Becton on: 12/28/2011 02:16 PM   Modules accepted: Orders

## 2011-12-28 NOTE — Patient Instructions (Signed)
MRI SCHEDULE AT [facility]  THURS 6.20.13 AT 5PM [phone] ARRIVE AT ADMITTING AT 445PM FOR REGISTRATION

## 2011-12-28 NOTE — Assessment & Plan Note (Addendum)
Exam suggestive of right L5 radiculopathy. Pt finished steroids, went to ED, got percocet, nothing has helped. Tearful in office. Will proceed with MRI per pt request for injection planning. Increasing percocet to 10/325, pt understands this is temporary.

## 2011-12-28 NOTE — Progress Notes (Signed)
Patient ID: Lindsey Payne, female   DOB: 27-Jul-1950, 61 y.o.   MRN: [id number] Subjective:   [id number] low back pain with radiculopathy.  HPI:the patient returns with persistence of her pain in her back, severe, radiating down the back of her right leg, and anterior aspect of her shin. I saw her approximately 5 days ago, and started prednisone, Flexeril, Mobic, and asked her to do some physical therapy. She's failed this, and recently went to the ER where she was given some Percocet. The Percocet did not help, and neither did a shot of Toradol given at the family practice center. In light of the above, I will proceed with an MRI of her lumbar spine for transforaminal versus intralaminar injection planning.  Past medical history, surgical history, family history, social history, allergies, and medications reviewed from the medical record and no changes needed.  Review of Systems: No fevers, chills, night sweats, weight loss, chest pain, or shortness of breath.    Objective:  General:  Well Developed, well nourished, and in no acute distress. Neuro:  Alert and oriented x3, extra-ocular muscles intact. Skin: Warm and dry, no rashes noted. Respiratory:  Not using accessory muscles, speaking in full sentences. Musculoskeletal:  Back Exam: Inspection: Unremarkable Motion: Flexion 45 deg, Extension 45 deg, Side Bending to 45 deg bilaterally,  Rotation to 45 deg bilaterally SLR laying:  Negative XSLR laying: Negative Palpable tenderness: None FABER: negative Sensory change: Gross sensation intact to all lumbar and sacral dermatomes. Reflexes: 2+ at both patellar tendons, 1+ at achilles tendons, Babinski's downgoing.  Strength at foot Plantar-flexion: 5/5    Dorsi-flexion: 5/5    Eversion: 5/5   Inversion: 5/5 Leg strength Quad: 5/5   Hamstring: 5/5   Hip flexor: 5/5   Hip abductors: 5/5 Gait unremarkable.  Assessment & Plan:

## 2011-12-30 ENCOUNTER — Ambulatory Visit (HOSPITAL_COMMUNITY)
Admission: RE | Admit: 2011-12-30 | Discharge: 2011-12-30 | Disposition: A | Discharge status: Home / Self Care | Payer: Medicare Other | Source: Ambulatory Visit | Attending: Sports Medicine | Admitting: Sports Medicine

## 2011-12-30 DIAGNOSIS — M25551 Pain in right hip: Secondary | ICD-10-CM

## 2011-12-31 ENCOUNTER — Telehealth: Payer: Self-pay | Admitting: *Deleted

## 2011-12-31 MED ORDER — DIAZEPAM 5 MG PO TABS: ORAL_TABLET | ORAL | Status: DC

## 2011-12-31 NOTE — Telephone Encounter (Signed)
Pt called stating she was unable to lie flat for the extended period of time it took for MRI due to pain.  They were not able to complete the imaging.  Pt states her pain is severe, and the percocet is not helping.  Wants to know what she should be her next step?

## 2011-12-31 NOTE — Telephone Encounter (Signed)
Per Dr. Karie Schwalbe- rescheduled pt's MRI and called in valium rx is in this note.  Pt to have MRI in open scanner @ [facility] 01/08/12 at 12:30pm.  Pt notified of MRI appt and rx called in.

## 2011-12-31 NOTE — Telephone Encounter (Signed)
I am going to send this to Darlin Priestly, MD, since he has seen the patient a few times, and I have never examined the patient.   Given circumstances, he may want to directly consult Dr. Maurice Small, Candida Peeling, or [facility]. A CT myelogram is also another option. He can feel free to call me if he wants to discuss the case. Dr. Yevette Edwards would also be another good resource to consult.

## 2012-01-08 ENCOUNTER — Other Ambulatory Visit: Payer: Self-pay | Admitting: Sports Medicine

## 2012-01-08 ENCOUNTER — Other Ambulatory Visit: Payer: Medicare Other

## 2012-01-08 ENCOUNTER — Ambulatory Visit
Admission: RE | Admit: 2012-01-08 | Discharge: 2012-01-08 | Disposition: A | Discharge status: Home / Self Care | Payer: Medicare Other | Source: Ambulatory Visit | Attending: Sports Medicine | Admitting: Sports Medicine

## 2012-01-08 DIAGNOSIS — M25551 Pain in right hip: Secondary | ICD-10-CM

## 2012-01-10 ENCOUNTER — Encounter: Payer: Self-pay | Admitting: *Deleted

## 2012-01-10 ENCOUNTER — Other Ambulatory Visit: Payer: Self-pay | Admitting: Sports Medicine

## 2012-01-10 ENCOUNTER — Other Ambulatory Visit: Payer: Self-pay | Admitting: *Deleted

## 2012-01-10 DIAGNOSIS — M25551 Pain in right hip: Secondary | ICD-10-CM

## 2012-01-10 DIAGNOSIS — M5416 Radiculopathy, lumbar region: Secondary | ICD-10-CM

## 2012-01-10 NOTE — Progress Notes (Signed)
I saw this patient recently with low back pain, as well as pain radiating down the right leg in an L5 type distribution. 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 Her right L5 nerve root is symptomatically, we will send her for transforaminal injections of the L5 nerve root.

## 2012-01-10 NOTE — Patient Instructions (Signed)
SPINE INJECTION AT [facility] [address] LOCATION TUES 7.2.13 AT 945A PT WILL NEED DRIVER DANIELLE FRM [facility] WILL INFORM PT [facility]-[phone]

## 2012-01-11 ENCOUNTER — Ambulatory Visit
Admission: RE | Admit: 2012-01-11 | Discharge: 2012-01-11 | Disposition: A | Discharge status: Home / Self Care | Payer: Medicare Other | Source: Ambulatory Visit | Attending: Sports Medicine | Admitting: Sports Medicine

## 2012-01-11 VITALS — BP 135/71 | HR 85

## 2012-01-11 DIAGNOSIS — M25551 Pain in right hip: Secondary | ICD-10-CM

## 2012-01-11 MED ORDER — IOHEXOL 180 MG/ML  SOLN
1.0000 mL | Freq: Once | INTRAMUSCULAR | Status: AC | PRN
  Administered 2012-01-11: 1 mL via EPIDURAL

## 2012-01-11 MED ORDER — METHYLPREDNISOLONE ACETATE 40 MG/ML INJ SUSP (RADIOLOG
120.0000 mg | Freq: Once | INTRAMUSCULAR | Status: AC
  Administered 2012-01-11: 120 mg via EPIDURAL

## 2012-01-11 NOTE — Discharge Instructions (Signed)

## 2012-01-21 ENCOUNTER — Ambulatory Visit: Payer: Medicare Other | Admitting: Family Medicine

## 2012-01-21 ENCOUNTER — Ambulatory Visit (INDEPENDENT_AMBULATORY_CARE_PROVIDER_SITE_OTHER): Payer: Medicare Other | Admitting: Family Medicine

## 2012-01-21 ENCOUNTER — Other Ambulatory Visit: Payer: Self-pay | Admitting: Family Medicine

## 2012-01-21 VITALS — BP 104/67

## 2012-01-21 DIAGNOSIS — IMO0002 Reserved for concepts with insufficient information to code with codable children: Secondary | ICD-10-CM

## 2012-01-21 DIAGNOSIS — M25559 Pain in unspecified hip: Secondary | ICD-10-CM

## 2012-01-21 DIAGNOSIS — M549 Dorsalgia, unspecified: Secondary | ICD-10-CM

## 2012-01-21 DIAGNOSIS — M25551 Pain in right hip: Secondary | ICD-10-CM

## 2012-01-21 NOTE — Progress Notes (Signed)
Danielle from [facility] called back with clarification of this issue at hand. Pt was told to call the office to have another order put in the system before she could have a second injection. Pt is already scheduled, and was scheduled when she came in this am to see [place], for a second injection on Monday, July 15th. Told danielle to "lets keep that appt for her". Danielle called the pt and told her already. Pt agreed to come on the 15th for the next second injection. Unfortunately pt can only have 3 injections in a 21-month span (6 mos from the first injection) so pt will schd one more appt after Monday, which will be her final one until November 2013.

## 2012-01-24 ENCOUNTER — Ambulatory Visit
Admission: RE | Admit: 2012-01-24 | Discharge: 2012-01-24 | Disposition: A | Discharge status: Home / Self Care | Payer: Medicare Other | Source: Ambulatory Visit | Attending: Family Medicine | Admitting: Family Medicine

## 2012-01-24 VITALS — BP 136/64 | HR 59

## 2012-01-24 DIAGNOSIS — M549 Dorsalgia, unspecified: Secondary | ICD-10-CM

## 2012-01-24 MED ORDER — METHYLPREDNISOLONE ACETATE 40 MG/ML INJ SUSP (RADIOLOG
120.0000 mg | Freq: Once | INTRAMUSCULAR | Status: AC
  Administered 2012-01-24: 120 mg via EPIDURAL

## 2012-01-24 MED ORDER — IOHEXOL 180 MG/ML  SOLN
1.0000 mL | Freq: Once | INTRAMUSCULAR | Status: AC | PRN
  Administered 2012-01-24: 1 mL via EPIDURAL

## 2012-01-25 NOTE — Progress Notes (Signed)
Patient had an epidural steroid injection a week and a half ago and [facility] was told to followup with [place]. She came here today thinking she was going to have her second spinal injection. We discussed with [facility] and have clarified this. She was supposed to followup with him in 2 months for her second injection and contact us for an order. We have put this order in for her in 2 months a [facility]. Please see the note below. No charge for this office visit

## 2012-02-03 ENCOUNTER — Other Ambulatory Visit: Payer: Self-pay | Admitting: *Deleted

## 2012-02-03 ENCOUNTER — Other Ambulatory Visit: Payer: Self-pay | Admitting: Family Medicine

## 2012-02-03 DIAGNOSIS — M549 Dorsalgia, unspecified: Secondary | ICD-10-CM

## 2012-02-03 DIAGNOSIS — IMO0002 Reserved for concepts with insufficient information to code with codable children: Secondary | ICD-10-CM

## 2012-02-08 ENCOUNTER — Other Ambulatory Visit: Payer: Self-pay | Admitting: Family Medicine

## 2012-02-08 ENCOUNTER — Ambulatory Visit
Admission: RE | Admit: 2012-02-08 | Discharge: 2012-02-08 | Disposition: A | Discharge status: Home / Self Care | Payer: Medicare Other | Source: Ambulatory Visit | Attending: Family Medicine | Admitting: Family Medicine

## 2012-02-08 VITALS — BP 129/62 | HR 54

## 2012-02-08 DIAGNOSIS — M549 Dorsalgia, unspecified: Secondary | ICD-10-CM

## 2012-02-08 MED ORDER — METHYLPREDNISOLONE ACETATE 40 MG/ML INJ SUSP (RADIOLOG
120.0000 mg | Freq: Once | INTRAMUSCULAR | Status: AC
  Administered 2012-02-08: 120 mg via EPIDURAL

## 2012-02-08 MED ORDER — IOHEXOL 180 MG/ML  SOLN
1.0000 mL | Freq: Once | INTRAMUSCULAR | Status: AC | PRN
  Administered 2012-02-08: 1 mL via EPIDURAL

## 2012-02-22 ENCOUNTER — Other Ambulatory Visit: Payer: Self-pay | Admitting: Family Medicine

## 2012-02-22 DIAGNOSIS — Z1231 Encounter for screening mammogram for malignant neoplasm of breast: Secondary | ICD-10-CM

## 2012-03-15 ENCOUNTER — Ambulatory Visit
Admission: RE | Admit: 2012-03-15 | Discharge: 2012-03-15 | Disposition: A | Discharge status: Home / Self Care | Payer: Medicare Other | Source: Ambulatory Visit | Attending: Family Medicine | Admitting: Family Medicine

## 2012-03-15 DIAGNOSIS — Z1231 Encounter for screening mammogram for malignant neoplasm of breast: Secondary | ICD-10-CM

## 2012-03-23 ENCOUNTER — Other Ambulatory Visit: Payer: Self-pay | Admitting: Family Medicine

## 2012-04-12 ENCOUNTER — Telehealth: Payer: Self-pay | Admitting: *Deleted

## 2012-04-12 NOTE — Telephone Encounter (Signed)
Message copied by Aram Beecham on Wed Apr 12, 2012  3:57 PM ------      Message from: Simone Curia T      Created: Mon Mar 27, 2012  6:27 PM       This is a little far out - anyone else on [facility] team have time?  Not an emergency, though, so if it's not easy to schedule her with someone else, then put her on my Nov sched.  Thanks!            Byrd Hesselbach            ----- Message -----         From: Aurther Loft, LPN         Sent: 03/27/2012  12:18 PM           To: Leona Singleton, MD            Called and spoke with patient, she can only do morning appointments and unfortunately you dont have anything this month or next month. Informed patient we would call her once we have the November schedule, or will this be too far out?      ----- Message -----         From: Leona Singleton, MD         Sent: 03/24/2012   6:29 PM           To: [facility]            Please call patient and let her know to set up appointment to meet me and discuss maintenance of her medical issues. Thanks!      Byrd Hesselbach

## 2012-04-12 NOTE — Telephone Encounter (Signed)
Spoke with patient, she can only do morning appointments and Dr. Benjamin Stain has no morning clinics, appointment scheduled with Dr. Madolyn Frieze on 05/01/12 at 845am.

## 2012-05-01 ENCOUNTER — Ambulatory Visit (INDEPENDENT_AMBULATORY_CARE_PROVIDER_SITE_OTHER): Payer: Medicare Other | Admitting: Family Medicine

## 2012-05-01 ENCOUNTER — Encounter: Payer: Self-pay | Admitting: Family Medicine

## 2012-05-01 VITALS — BP 143/72 | HR 88 | Temp 98.3°F | Ht 69.0 in | Wt 188.0 lb

## 2012-05-01 DIAGNOSIS — F411 Generalized anxiety disorder: Secondary | ICD-10-CM

## 2012-05-01 DIAGNOSIS — I2510 Atherosclerotic heart disease of native coronary artery without angina pectoris: Secondary | ICD-10-CM

## 2012-05-01 DIAGNOSIS — F17200 Nicotine dependence, unspecified, uncomplicated: Secondary | ICD-10-CM

## 2012-05-01 DIAGNOSIS — E785 Hyperlipidemia, unspecified: Secondary | ICD-10-CM

## 2012-05-01 DIAGNOSIS — F329 Major depressive disorder, single episode, unspecified: Secondary | ICD-10-CM

## 2012-05-01 DIAGNOSIS — F3289 Other specified depressive episodes: Secondary | ICD-10-CM

## 2012-05-01 DIAGNOSIS — I5022 Chronic systolic (congestive) heart failure: Secondary | ICD-10-CM

## 2012-05-01 DIAGNOSIS — Z0000 Encounter for general adult medical examination without abnormal findings: Secondary | ICD-10-CM

## 2012-05-01 DIAGNOSIS — I10 Essential (primary) hypertension: Secondary | ICD-10-CM

## 2012-05-01 DIAGNOSIS — I2589 Other forms of chronic ischemic heart disease: Secondary | ICD-10-CM

## 2012-05-01 LAB — LDL CHOLESTEROL, DIRECT: Direct LDL: 135 mg/dL — ABNORMAL HIGH

## 2012-05-01 LAB — BASIC METABOLIC PANEL
BUN: 12 mg/dL (ref 6–23)
Calcium: 10.1 mg/dL (ref 8.4–10.5)
Glucose, Bld: 79 mg/dL (ref 70–99)
Potassium: 4.4 mEq/L (ref 3.5–5.3)

## 2012-05-01 MED ORDER — PAROXETINE HCL 20 MG PO TABS: ORAL_TABLET | ORAL | Status: DC

## 2012-05-01 MED ORDER — DTAP-HEPATITIS B RECOMB-IPV IM SUSP: 0.5000 mL | Freq: Once | INTRAMUSCULAR | Status: DC

## 2012-05-01 NOTE — Patient Instructions (Addendum)
For your depression/anxiety: -increase Paxil to 1.5 tablets for 1 week, then 2 tablets after than -Follow-up in 1 month   I hope you consider picking a quit date to stop smoking  If your lab results are normal, I will send you a letter with the results. If abnormal, someone at the clinic will get in touch with you.   Tetanus shot today  Start calcium (600 mg twice a day), and Vitamin D (400 mg twice a day) to help keep your bones healthy.   It was nice to meet you

## 2012-05-02 ENCOUNTER — Encounter: Payer: Self-pay | Admitting: Family Medicine

## 2012-05-02 MED ORDER — ROSUVASTATIN CALCIUM 20 MG PO TABS: 10.0000 mg | ORAL_TABLET | Freq: Every day | ORAL | Status: DC

## 2012-05-02 NOTE — Assessment & Plan Note (Signed)
See anxiety A/P. 

## 2012-05-02 NOTE — Assessment & Plan Note (Signed)
Will increase Paxil to goal of 40. Follow-up in 1 month.

## 2012-05-02 NOTE — Progress Notes (Signed)
  Subjective:    Patient ID: Lindsey Payne, female    DOB: Feb 22, 1951, 61 y.o.   MRN: [id number]  HPI # Tobacco Smoking 1/3 ppd She is not ready to quit  # Anxiety, depression She feels like Paxil helps but sometimes she does get anxious spells that make her want to avoid people ROS: denies feelings of hopelessness, SI/HI  # HLD Compliant with statin ROS: denies muscle aches, RUQ pain  # CAD, ischemic cardiomyopathy, chronic systolic heart failure She saw Dr. Donnie Aho several months ago. No changes were made to her medications at that time. Her follow-up is early next year.  She reports compliance with her medications ROS: denies chest pain/palpitations, difficulty breathing  Review of Systems Per HPI  Allergies, medication, past medical history reviewed.      Objective:   Physical Exam GEN: NAD CV: RRR, normal S1/S2, no murmurs/gallops PULM: NI WOB; CTAB  EXT: no edema PSYCH: mildly anxious-appearing, not depressed-appearing    Assessment & Plan:

## 2012-05-02 NOTE — Assessment & Plan Note (Signed)
Start Ca-vitamin D for bone health in post-menopausal female, tobacco user.

## 2012-05-02 NOTE — Assessment & Plan Note (Addendum)
Fair control. Continue current medications for now.

## 2012-05-02 NOTE — Assessment & Plan Note (Signed)
She is not interested in quitting  

## 2012-05-02 NOTE — Assessment & Plan Note (Signed)
Will increase rosuvastatin from 10 to 20. Recheck in 6 months.

## 2012-05-03 NOTE — Progress Notes (Signed)
Pt informed and agreeable. Lindsey Payne  

## 2012-05-20 ENCOUNTER — Other Ambulatory Visit: Payer: Self-pay | Admitting: Family Medicine

## 2012-05-30 ENCOUNTER — Ambulatory Visit: Payer: Medicare Other | Admitting: Family Medicine

## 2012-05-30 ENCOUNTER — Encounter: Payer: Self-pay | Admitting: Family Medicine

## 2012-05-30 VITALS — BP 122/80 | HR 88 | Temp 98.4°F | Ht 69.0 in | Wt 190.0 lb

## 2012-05-30 DIAGNOSIS — Z0000 Encounter for general adult medical examination without abnormal findings: Secondary | ICD-10-CM | POA: Insufficient documentation

## 2012-05-30 NOTE — Progress Notes (Signed)
Patient ID: Lindsey Payne, female   DOB: June 28, 1951, 61 y.o.   MRN: [id number]  Was running 30 minutes late to patient appointment today and patient left clinic.  Called at the end of clinic day to apologize for running late.  Simone Curia 05/30/2012 4:51 PM

## 2012-07-10 ENCOUNTER — Encounter: Payer: Self-pay | Admitting: Family Medicine

## 2012-07-10 ENCOUNTER — Ambulatory Visit (INDEPENDENT_AMBULATORY_CARE_PROVIDER_SITE_OTHER): Payer: Medicare Other | Admitting: Family Medicine

## 2012-07-10 VITALS — BP 139/77 | HR 65 | Ht 69.0 in | Wt 186.8 lb

## 2012-07-10 DIAGNOSIS — K649 Unspecified hemorrhoids: Secondary | ICD-10-CM | POA: Insufficient documentation

## 2012-07-10 DIAGNOSIS — F17200 Nicotine dependence, unspecified, uncomplicated: Secondary | ICD-10-CM

## 2012-07-10 DIAGNOSIS — E785 Hyperlipidemia, unspecified: Secondary | ICD-10-CM

## 2012-07-10 DIAGNOSIS — F329 Major depressive disorder, single episode, unspecified: Secondary | ICD-10-CM

## 2012-07-10 LAB — LIPID PANEL
LDL Cholesterol: 76 mg/dL (ref 0–99)
Total CHOL/HDL Ratio: 3.3 Ratio

## 2012-07-10 NOTE — Progress Notes (Signed)
  Subjective:    Patient ID: Lindsey Payne, female    DOB: [birth year], 61 y.o.   MRN: [id number]  HPI  Lindsey Payne comes in for follow up.    Depression- at last office visit paxil was increased to 40 mg daily.  She says she thinks this has helped.  She has less worrying and tearfulness.  She is able to sleep well.  Denies depressed mood, anhedonia SI/HI.  HLD- taking increased dose of crestor due to high direct LDL.  She is tolerating this well.  She has not had a full lipid panel in more than a year.   Hemorrhoids- has had them on and off for several years.  They have gotten worse lately.  She is not using any cream on them.  She has constipation, and when she has to strain she sees blood when she wipes.   Tobacco abuse: Smoking 3-4 cigarettes/day, wants to quit, does not want any assistance with quitting.   Past Medical History  Diagnosis Date  . Arthritis   . Asthma   . Depression   . Hyperlipidemia   . Hypertension   . Adenoma of large intestine   . Back pain    History  Substance Use Topics  . Smoking status: Current Every Day Smoker -- 0.2 packs/day    Types: Cigarettes    Last Attempt to Quit: 09/21/2011  . Smokeless tobacco: Never Used  . Alcohol Use: Yes     Comment: beer occasionally     Review of Systems Pertinent items in HPI    Objective:   Physical Exam BP 139/77  Pulse 65  Ht 5\' 9"  (1.753 m)  Wt 186 lb 12.8 oz (84.732 kg)  BMI 27.59 kg/m2 General appearance: alert, cooperative and no distress Neck: no adenopathy, supple, symmetrical, trachea midline and thyroid not enlarged, symmetric, no tenderness/mass/nodules Lungs: clear to auscultation bilaterally Heart: regular rate and rhythm, S1, S2 normal, no murmur, click, rub or gallop Extremities: extremities normal, atraumatic, no cyanosis or edema Pulses: 2+ and symmetric       Assessment & Plan:

## 2012-07-10 NOTE — Assessment & Plan Note (Signed)
Improved with increased dose of Paxil, continue current dose. F/U in 3 months.

## 2012-07-10 NOTE — Assessment & Plan Note (Signed)
Tolerating increased dose of crestor, will check lipid profile as pt is due and is fasting today.

## 2012-07-10 NOTE — Assessment & Plan Note (Signed)
Cutting down, wants to quit.  She declines nicotine replacement or to be seen in Rx clinic for counseling.

## 2012-07-10 NOTE — Assessment & Plan Note (Signed)
Discussed bowel regimen for goal of 1 soft bowel movement daily, suggested adding metamucil and/or miralax.  Advised to sue preparation H.  F/U if bleeding does not improve.

## 2012-07-10 NOTE — Patient Instructions (Signed)
It was nice to meet you today.  I will send you a letter with your lab results, or call you if anything is abnormal.    Please continue to walk and get exercise, as well as watch your diet.   Congratulations on cutting back on your smoking, please let us know if you want any help with quitting.

## 2012-07-14 ENCOUNTER — Other Ambulatory Visit: Payer: Self-pay | Admitting: Family Medicine

## 2012-07-14 ENCOUNTER — Encounter: Payer: Self-pay | Admitting: Family Medicine

## 2012-07-16 ENCOUNTER — Other Ambulatory Visit: Payer: Self-pay | Admitting: Family Medicine

## 2012-08-11 ENCOUNTER — Other Ambulatory Visit: Payer: Self-pay | Admitting: Family Medicine

## 2012-08-14 ENCOUNTER — Other Ambulatory Visit: Payer: Self-pay | Admitting: Family Medicine

## 2012-08-14 MED ORDER — PAROXETINE HCL 40 MG PO TABS: ORAL_TABLET | ORAL | Status: DC

## 2012-08-14 NOTE — Telephone Encounter (Signed)
Need to discuss refilling of spironolactone and lisinopril as these have no clear start and end date in EPIC and not clearly noted in any recent visits.  Will refill paxil now.

## 2012-08-14 NOTE — Telephone Encounter (Signed)
Will not refill these medications due to it being unclear if she should still be on them.  Previously prescribed in ED in 12/2011 with no number refills listed, and no refills since by previous provider. Please have patient make appt with PCP to discuss if still needs to be on these.

## 2012-08-26 ENCOUNTER — Other Ambulatory Visit: Payer: Self-pay | Admitting: Family Medicine

## 2012-08-27 ENCOUNTER — Other Ambulatory Visit: Payer: Self-pay | Admitting: Family Medicine

## 2012-08-27 NOTE — Telephone Encounter (Signed)
Needs MD appt prior to more refills to follow-up on chronic illnesses.

## 2012-08-27 NOTE — Telephone Encounter (Signed)
Responded to refill request with 1 refill (2 months). Pt needs MD appt prior to more refills.

## 2012-09-21 ENCOUNTER — Other Ambulatory Visit: Payer: Self-pay | Admitting: Family Medicine

## 2012-09-21 NOTE — Telephone Encounter (Signed)
Will not refill Amlodipine due to it being unclear if pt should still be on it. Previously prescribed in ED in 12/2011 with no number refills listed, and no refills since by previous provider. Please have patient make appt with PCP to discuss if still needs to be on these.

## 2012-10-16 ENCOUNTER — Ambulatory Visit: Payer: Medicare Other | Admitting: Family Medicine

## 2012-10-18 ENCOUNTER — Other Ambulatory Visit: Payer: Self-pay | Admitting: Family Medicine

## 2012-10-19 NOTE — Telephone Encounter (Signed)
Again, will not refill Amlodipine due to it being unclear if pt should still be on it. Previously prescribed in ED in 12/2011 with no number refills listed, and no refills since by previous provider. Please have patient make appt with PCP to discuss if still needs to be on these or at least call in to let us know. Canceled recent appt so have not seen her.

## 2012-11-10 ENCOUNTER — Ambulatory Visit: Payer: Medicare Other | Admitting: Family Medicine

## 2012-11-14 ENCOUNTER — Other Ambulatory Visit: Payer: Self-pay | Admitting: Family Medicine

## 2012-11-15 ENCOUNTER — Other Ambulatory Visit: Payer: Self-pay | Admitting: Family Medicine

## 2012-11-15 ENCOUNTER — Telehealth: Payer: Self-pay | Admitting: Family Medicine

## 2012-11-15 NOTE — Telephone Encounter (Signed)
Called and discussed with patient who confirms that she is taking metoprolol ER and furosemide as prescribed. Also stated she has an appointment scheduled with me.  Refilled medication.  Simone Curia 11/15/2012 5:26 PM

## 2012-11-17 ENCOUNTER — Encounter: Payer: Self-pay | Admitting: Family Medicine

## 2012-11-17 ENCOUNTER — Ambulatory Visit (INDEPENDENT_AMBULATORY_CARE_PROVIDER_SITE_OTHER): Payer: Medicare Other | Admitting: Family Medicine

## 2012-11-17 VITALS — BP 132/62 | Temp 98.3°F | Ht 69.0 in | Wt 181.0 lb

## 2012-11-17 DIAGNOSIS — I5022 Chronic systolic (congestive) heart failure: Secondary | ICD-10-CM

## 2012-11-17 DIAGNOSIS — E785 Hyperlipidemia, unspecified: Secondary | ICD-10-CM

## 2012-11-17 DIAGNOSIS — Z0000 Encounter for general adult medical examination without abnormal findings: Secondary | ICD-10-CM

## 2012-11-17 DIAGNOSIS — I10 Essential (primary) hypertension: Secondary | ICD-10-CM

## 2012-11-17 DIAGNOSIS — F3289 Other specified depressive episodes: Secondary | ICD-10-CM

## 2012-11-17 DIAGNOSIS — F329 Major depressive disorder, single episode, unspecified: Secondary | ICD-10-CM

## 2012-11-17 DIAGNOSIS — F17200 Nicotine dependence, unspecified, uncomplicated: Secondary | ICD-10-CM

## 2012-11-17 LAB — BASIC METABOLIC PANEL
BUN: 13 mg/dL (ref 6–23)
CO2: 26 mEq/L (ref 19–32)
Calcium: 10 mg/dL (ref 8.4–10.5)
Glucose, Bld: 95 mg/dL (ref 70–99)

## 2012-11-17 MED ORDER — ZOSTER VACCINE LIVE 19400 UNT/0.65ML ~~LOC~~ SOLR: 0.6500 mL | Freq: Once | SUBCUTANEOUS | Status: DC

## 2012-11-17 NOTE — Assessment & Plan Note (Addendum)
BP well controlled on current regimen with no side effects or hypo-/hypertensive symptoms. - Continue Lasix 20mg  BID, hydralazine 50mg  BID, isosorbide dinitrate 20mg  BID, Lisinopril 20mg  Daily, Toprol-XL 25mg  BID, spironolactone 25mg  daily - BMET today - Hold off on norvasc with good control currently and not taking x 1 year. - F/u in 3-6 months

## 2012-11-17 NOTE — Assessment & Plan Note (Signed)
-   Continue crestor - Pt opts to recheck lipids 06/2013

## 2012-11-17 NOTE — Patient Instructions (Addendum)
It was good to see you today.  - HTN: Continue taking medications as prescribed. We can hold off on norvasc for now since your BP looks good. If it starts getting higher than 140s/90s at home, let me know and we may re-add this.  - For your depression, continue paxil and let me know if you would like to speak with Dr. Pascal Lux again. Otherwise, I am glad all is well now. You are filling out a PHQ-9 scale for me today.  - For your high cholesterol, continue crestor. We can recheck your labs in December.  Otherwise, we will check basic labs today.  If all is normal, I will send you a letter. If anything is abnormal, I will call you. If you do not hear from [place] in 2 weeks, call in as I may have had trouble reaching you. I am giving you a prescription for zostafax. When you get your shot, bring the confirmation form to [place].  Follow up with me in 3-6 months or sooner if needed.

## 2012-11-17 NOTE — Assessment & Plan Note (Addendum)
Well-controlled on paxil 40mg  daily with no SI/HI and feeling well. - PHQ-9 today score 0 - Continue paxil  - Pt encouraged to f/u as needed with any concerns

## 2012-11-17 NOTE — Progress Notes (Signed)
Subjective:     Patient ID: Lindsey Payne, female   DOB: May 12, 1951, 62 y.o.   MRN: [id number]  CC - Follow-up of chronic diseases  HPI  - 62 y.o. female with h/o depression, HTN, HLD, tobacco abuse, systolic CHF and ischemic cardiomyopathy here to f/u on chronic disease.  1. Hypertension - Pt with arterial HTN diagnosis since high school per pt, well-controlled on current regimen, denies medication SEs or high/low blood pressures at home. Has not been taking norvasc x 1 year when ran out of medication. Does not need refills today.  2. Chronic CHF and ischemic cardiomyopathy - Pt followed by Dr. Donnie Aho who she saw in January 2014 with no concerns.   3. Depressive disorder NOS - Diagnosis >10 years, controlled on paxil per pt. Currently feels well, denies SI/HI, states she sleeps well and denies SE from paxil. Has seen Dr. Pascal Lux in the past. Has never been diagnosed with bipolar disorder.  4. Tobacco abuse - down to 1 cigarette daily. 10/10 motivation to quit, due to son wanting her to quit; 2/10 confidence to quit, due to "just needing those 2 cigarettes daily" when she eats or drinks coffee. Does not want referral yet to Health Coach or pharmacist but will let us know when she does. Heard that nicotine patches can cause cancer if positive FH. Tried chantix 7 years ago x 1 week but did not help and feared side effects.  5. Hyperlipidemia - On crestor with no myalgias reported.   Review of Systems  - Per HPI.  All other systems reviewed and are negative.   Past Medical History  Diagnosis Date  . Arthritis   . Asthma   . Depression   . Hyperlipidemia   . Hypertension   . Adenoma of large intestine   . Back pain    Medications - reviewed. - Pt stopped taking norvasc when ran out 1 year ago  [facility] - smokes 2 cigarettes daily per above.  FH - Breast (mother) and colon cancer (father, 10s).    Objective:   Physical Exam BP 132/62  Temp(Src) 98.3 F (36.8 C) (Oral)  Ht 5\' 9"   (1.753 m)  Wt 181 lb (82.101 kg)  BMI 26.72 kg/m2 GEN: NAD, pleasant African American female seated in exam room CV: RRR, distant, but no clearly heard murmurs, rubs, or gallops; 2+ bilateral DP pulses PULM: CTAB, normal effort EXTR: NO LE edema or tenderness SKIN: No rash or cyanosis PSYCH: Mood "good", affect congruent, normal rate and tone of speech, linear thought process, goal-oriented, denies SI/HI NEURO: Awake, alert, normal speech, no focal deficits, normal gait    Assessment:     62 y.o. female with h/o depression, HTN, HLD, tobacco abuse, systolic CHF and ischemic cardiomyopathy here to f/u on chronic disease.    Plan:     # Health maintenance -  - Gave rx for zostavax - Colonoscopy due in 2020 (pt reports normal colonoscopy 2010 and checked off in Health Maintenance tab as done 03/30/09, though no result in EPIC) - Mammogram due Sept 2014 - Lipids due 06/2013  - Pap - last in system 02/2001 - Will ask patient about this and consider repeat at follow-up.

## 2012-11-17 NOTE — Assessment & Plan Note (Signed)
Under good control currently. Followed by Dr. Donnie Aho yearly, with last visit per pt in January with no concerns. - F/u as scheduled with Dr. Donnie Aho - Continue lisinopril, lasix, spironolactone, metoprolol XL, crestor, aspirin 81mg , and isosorbide dinitrate

## 2012-11-17 NOTE — Assessment & Plan Note (Signed)
-   Trying to quit and for the past 1-2 years has been down to 2 cigarettes per day. Motivated but not confident.  - Continue trying on own, as opts not to have referral to pharmacy clinic or health coach or myself for now; will call when wants this. - Tried chantix x 1 week 7 years ago, feared side effects and med did not help so quit using - Afraid of nicotine patches as thinks they can cause cancer with fh of breast and colon cancer - Filled out [facility] information with help of Arlys John, health coach

## 2012-11-24 ENCOUNTER — Telehealth: Payer: Self-pay | Admitting: Family Medicine

## 2012-11-24 DIAGNOSIS — R7989 Other specified abnormal findings of blood chemistry: Secondary | ICD-10-CM

## 2012-11-24 NOTE — Telephone Encounter (Signed)
Called patient and informed her that creatinine has mildly bumped from 1.01 to 1.35. She denies any medication changes since Cr 1.01 in 04/2012. Could be from dehydration though [name] looked in good health at our most recent visit. Would like to recheck in 2 weeks. Ordering future BMET and informed patient to make lab-only visit in 2 weeks. She voiced understanding.  Lindsey Payne  11/24/2012 7:38 PM

## 2012-12-08 ENCOUNTER — Other Ambulatory Visit: Payer: Medicare Other

## 2012-12-08 DIAGNOSIS — R7989 Other specified abnormal findings of blood chemistry: Secondary | ICD-10-CM

## 2012-12-08 LAB — BASIC METABOLIC PANEL
Calcium: 9.4 mg/dL (ref 8.4–10.5)
Creat: 1.26 mg/dL — ABNORMAL HIGH (ref 0.50–1.10)
Sodium: 138 mEq/L (ref 135–145)

## 2012-12-14 ENCOUNTER — Other Ambulatory Visit: Payer: Self-pay | Admitting: Family Medicine

## 2012-12-15 ENCOUNTER — Other Ambulatory Visit: Payer: Self-pay | Admitting: Family Medicine

## 2013-01-10 ENCOUNTER — Telehealth: Payer: Self-pay | Admitting: Family Medicine

## 2013-01-10 NOTE — Telephone Encounter (Signed)
LMOVM for pt to return my call.  Bernestine Holsapple, Darlyne Russian, CMA

## 2013-01-10 NOTE — Telephone Encounter (Signed)
Pt returned call.  Appt scheduled for 01/30/2013 at 9:30am with MD per MD.  Radene Ou, CMA

## 2013-01-10 NOTE — Telephone Encounter (Signed)
Please call patient to let her know that because Creatinine was still slightly elevated (though stable from the previous check), I would like to see her in clinic to do a quick checkup regarding her blood pressure and possibly check labs one more time.  Leona Singleton, MD  01/10/2013 12:09 PM

## 2013-01-10 NOTE — Telephone Encounter (Signed)
LMOVM for pt to return call and schedule an appt. Breyon Blass Dawn  

## 2013-01-12 ENCOUNTER — Other Ambulatory Visit: Payer: Self-pay | Admitting: Family Medicine

## 2013-01-14 NOTE — Telephone Encounter (Signed)
Sending refill for lasix. Pt has f/u 7/22.

## 2013-01-16 ENCOUNTER — Other Ambulatory Visit: Payer: Self-pay | Admitting: *Deleted

## 2013-01-17 ENCOUNTER — Other Ambulatory Visit: Payer: Self-pay | Admitting: Family Medicine

## 2013-01-17 MED ORDER — ROSUVASTATIN CALCIUM 10 MG PO TABS: 10.0000 mg | ORAL_TABLET | Freq: Every day | ORAL | Status: DC

## 2013-01-17 NOTE — Telephone Encounter (Signed)
I just refilled this 3 days ago for #30 tabs with 2 refills.

## 2013-01-30 ENCOUNTER — Ambulatory Visit (INDEPENDENT_AMBULATORY_CARE_PROVIDER_SITE_OTHER): Payer: Medicare Other | Admitting: Family Medicine

## 2013-01-30 ENCOUNTER — Encounter: Payer: Self-pay | Admitting: Family Medicine

## 2013-01-30 VITALS — BP 109/44 | HR 61 | Temp 98.1°F | Ht 69.0 in | Wt 183.0 lb

## 2013-01-30 DIAGNOSIS — I10 Essential (primary) hypertension: Secondary | ICD-10-CM

## 2013-01-30 DIAGNOSIS — K625 Hemorrhage of anus and rectum: Secondary | ICD-10-CM

## 2013-01-30 DIAGNOSIS — Z0000 Encounter for general adult medical examination without abnormal findings: Secondary | ICD-10-CM

## 2013-01-30 DIAGNOSIS — F17200 Nicotine dependence, unspecified, uncomplicated: Secondary | ICD-10-CM

## 2013-01-30 HISTORY — DX: Hemorrhage of anus and rectum: K62.5

## 2013-01-30 LAB — CBC
Hemoglobin: 12.6 g/dL (ref 12.0–15.0)
MCH: 28.8 pg (ref 26.0–34.0)
MCHC: 34.9 g/dL (ref 30.0–36.0)
MCV: 82.4 fL (ref 78.0–100.0)
Platelets: 260 10*3/uL (ref 150–400)
RBC: 4.38 MIL/uL (ref 3.87–5.11)

## 2013-01-30 LAB — BASIC METABOLIC PANEL
BUN: 18 mg/dL (ref 6–23)
CO2: 26 mEq/L (ref 19–32)
Chloride: 106 mEq/L (ref 96–112)
Creat: 1.05 mg/dL (ref 0.50–1.10)
Potassium: 4.5 mEq/L (ref 3.5–5.3)

## 2013-01-30 NOTE — Progress Notes (Signed)
Patient ID: Lindsey Payne, female   DOB: 1951/03/15, 62 y.o.   MRN: [id number]  Subjective:   CC: Follow-up on HTN, discuss rectal bleeding  HPI:   1. Rectal bleeding - Pt reports 2 months of occasional rectal bleeding that she feels "fills" the toilet. She is also complaining of frequent bowel movements that are occasionally loose and occasionally hard, with each time she eats. She denies abdominal pain, fevers, chills, nausea, vomiting, dizziness, lightheadedness. Denies h/o IBS. Has hemorrhoids that are not painful and she thinks this could be coming from them, but also concerned about cancer with family history positive for colon cancer in father in his 41s, mother with breast cancer, sister with h/o breast cancer.  2. BP - Pt reports medication compliance and denies intolerance to medications. Denies dizziness or syncope. Took medication this morning. Stays hydrated. Recent creatinine elevation.  3. Immunizations - at a previous visit, prescribed DTAP/hep and zostavax immunizations. She got DTAP and hep immunization but not zostavax because she is concerned about cost.   Review of Systems - Per HPI.   FH - Father died of colon cancer in 21s - Mother died of breast cancer  SH - Has cut down on cigarettes to 2-3 cigarettes weekly    Objective:  Physical Exam BP 109/44  Pulse 61  Temp(Src) 98.1 F (36.7 C) (Oral)  Ht 5\' 9"  (1.753 m)  Wt 183 lb (83.008 kg)  BMI 27.01 kg/m2 GEN: NAD, pleasant PULM: Normal effort NEURO: Alert, normal speech and gait, no focal deficits grossly SKIN: No rash or cyanosis PSYCH: Mood and affect appear euthymic ABD: Soft, nondistended  Assessment:     Lindsey Payne is a 62 y.o. female here for follow-up of HTN and with rectal bleeding.    Plan:     # See problem list for problem-specific plans.

## 2013-01-30 NOTE — Assessment & Plan Note (Signed)
Has decreased to 2 cigarettes / week. - Continue working on stopping, contact me for support if needed

## 2013-01-30 NOTE — Assessment & Plan Note (Addendum)
Likely due to hemorrhoids, with no signs of GI infection or thrombosed hemorrhoid. - With positive FH of colon cancer, ordering GI referral and colonoscopy - Imodium for frequent stools. Stop if febrile/severe abdominal pain. - Return precautions reviewed. - F/u after colonoscopy - CBC today

## 2013-01-30 NOTE — Patient Instructions (Addendum)
It was great to see you again!  For your hypertension,  - We are checking your kidney function today and I will call you about this. OK to leave voice mail. - Continue taking medications as prescribed. - Continue working on stopping smoking - good job to drop down to 2 cigarettes / week! - Continue to stay hydrated.  For your rectal bleeding, - This could be your hemorrhoids.  - I want you to get a colonoscopy especially with your family history of colon cancer. I will put in a referral. If you don't get a call in 1-2 weeks, call us to follow up. - If you get fevers, severe abdominal pain, nausea, vomiting, or other concerns, come see [place] sooner. Otherwise, follow up with me after your colonoscopy. - Try immodium as needed for your frequent stools.  We are checking some labs today, and I will call you if they are abnormal. If you do not hear from me with a call or letter in 2 weeks, please call us as I may have been unable to reach you.   As you leave, make an appointment to follow up with me after your colonoscopy.  - Bring all medications in a bag to your visits.   Take care and seek immediate care sooner if you develop any concerns.

## 2013-01-30 NOTE — Assessment & Plan Note (Signed)
Controlled on lisinopril, lasix, spironolactone, hydralazine, metoprolol, and isosorbide dinitrate.  - Continue current regimen - BMET today, stay hydrated - Has decreased tobacco intake (see problem)

## 2013-01-31 ENCOUNTER — Encounter: Payer: Self-pay | Admitting: Gastroenterology

## 2013-01-31 ENCOUNTER — Encounter: Payer: Self-pay | Admitting: Family Medicine

## 2013-02-10 ENCOUNTER — Other Ambulatory Visit: Payer: Self-pay | Admitting: Family Medicine

## 2013-02-10 DIAGNOSIS — I10 Essential (primary) hypertension: Secondary | ICD-10-CM

## 2013-02-12 ENCOUNTER — Other Ambulatory Visit: Payer: Self-pay | Admitting: Family Medicine

## 2013-02-12 DIAGNOSIS — I10 Essential (primary) hypertension: Secondary | ICD-10-CM

## 2013-02-18 ENCOUNTER — Other Ambulatory Visit: Payer: Self-pay | Admitting: Family Medicine

## 2013-02-19 ENCOUNTER — Other Ambulatory Visit: Payer: Self-pay

## 2013-02-19 DIAGNOSIS — Z1231 Encounter for screening mammogram for malignant neoplasm of breast: Secondary | ICD-10-CM

## 2013-02-28 ENCOUNTER — Ambulatory Visit (INDEPENDENT_AMBULATORY_CARE_PROVIDER_SITE_OTHER): Payer: Medicare Other | Admitting: Gastroenterology

## 2013-02-28 ENCOUNTER — Encounter: Payer: Self-pay | Admitting: Gastroenterology

## 2013-02-28 VITALS — BP 118/70 | HR 74 | Ht 66.5 in | Wt 185.2 lb

## 2013-02-28 DIAGNOSIS — R198 Other specified symptoms and signs involving the digestive system and abdomen: Secondary | ICD-10-CM

## 2013-02-28 DIAGNOSIS — Z86010 Personal history of colonic polyps: Secondary | ICD-10-CM

## 2013-02-28 DIAGNOSIS — K921 Melena: Secondary | ICD-10-CM

## 2013-02-28 MED ORDER — PEG-KCL-NACL-NASULF-NA ASC-C 100 G PO SOLR: 1.0000 | Freq: Once | ORAL | Status: DC

## 2013-02-28 NOTE — Progress Notes (Signed)
History of Present Illness: This is a 62 year old female with a personal history of a tubulovillous adenoma status post right hemicolectomy in 1987. She had interval followup colonoscopies with Dr. Ovidio Kin most recently 2000 and 2005. She had a subsequent follow up colonoscopy by Dr. Charna Elizabeth in 2009. The only colonoscopy report that is available at this time is from December 2005 by Dr. Ovidio Kin showing internal hemorrhoids, a normal appearing anastomosis some retained stool in the transverse colon. She states she recently received a letter to return for followup colonoscopy from Dr. Loreta Ave. Her primary care physician referred her to me for further evaluation apparently not aware that she had a patient relationship Dr. Marisue Brooklyn. Patient states she has had a change in bowel habits over the past 6 months from about one bowel movement per day to 3 formed bowel movements per day he generally following meals. She has also noticed intermittent pressure rectal bleeding. Patient relates internal hemorrhoids were found at her last colonoscopy.  Review of Systems: Pertinent positive and negative review of systems were noted in the above HPI section. All other review of systems were otherwise negative.  Current Medications, Allergies, Past Medical History, Past Surgical History, Family History and Social History were reviewed in [employer] record.  Physical Exam: General: Well developed , well nourished, no acute distress Head: Normocephalic and atraumatic Eyes:  sclerae anicteric, EOMI Ears: Normal auditory acuity Mouth: No deformity or lesions Neck: Supple, no masses or thyromegaly Lungs: Clear throughout to auscultation Heart: Regular rate and rhythm; no murmurs, rubs or bruits Abdomen: Soft, non tender and non distended. No masses, hepatosplenomegaly or hernias noted. Normal Bowel sounds Rectal: deferred to colonoscopy Musculoskeletal: Symmetrical with no gross  deformities  Skin: No lesions on visible extremities Pulses:  Normal pulses noted Extremities: No clubbing, cyanosis, edema or deformities noted Neurological: Alert oriented x 4, grossly nonfocal Cervical Nodes:  No significant cervical adenopathy Inguinal Nodes: No significant inguinal adenopathy Psychological:  Alert and cooperative. Normal mood and affect  Assessment and Recommendations:  1. Personal history of TVA. Prior right hemicolectomy. Father with colon cancer in his 65s. Request records from Dr. Loreta Ave. Schedule colonoscopy. The risks, benefits, and alternatives to colonoscopy with possible biopsy, possible destruction of internal hemorrhoids and possible polypectomy were discussed with the patient and they consent to proceed.   2. Intermittent hematochezia likely secondary to known hemorrhoids. Further evaluation as above with colonoscopy.  3. Change in bowel habits. Further evaluation as above with colonoscopy.

## 2013-02-28 NOTE — Patient Instructions (Addendum)
You have been scheduled for a colonoscopy with propofol. Please follow written instructions given to you at your visit today.  Please pick up your prep kit at the pharmacy within the next 1-3 days. If you use inhalers (even only as needed), please bring them with you on the day of your procedure. Your physician has requested that you go to www.startemmi.com and enter the access code given to you at your visit today. This web site gives a general overview about your procedure. However, you should still follow specific instructions given to you by our office regarding your preparation for the procedure.  Thank you for choosing me and [facility].  Malcolm T. Stark, Jr., MD., FACG  

## 2013-03-05 ENCOUNTER — Ambulatory Visit (AMBULATORY_SURGERY_CENTER): Payer: Medicare Other | Admitting: Gastroenterology

## 2013-03-05 ENCOUNTER — Encounter: Payer: Self-pay | Admitting: Gastroenterology

## 2013-03-05 VITALS — BP 126/66 | HR 57 | Temp 96.0°F | Resp 23 | Ht 66.0 in | Wt 185.0 lb

## 2013-03-05 DIAGNOSIS — K648 Other hemorrhoids: Secondary | ICD-10-CM

## 2013-03-05 DIAGNOSIS — K921 Melena: Secondary | ICD-10-CM

## 2013-03-05 DIAGNOSIS — Z86010 Personal history of colonic polyps: Secondary | ICD-10-CM

## 2013-03-05 DIAGNOSIS — D126 Benign neoplasm of colon, unspecified: Secondary | ICD-10-CM

## 2013-03-05 MED ORDER — SODIUM CHLORIDE 0.9 % IV SOLN: 500.0000 mL | INTRAVENOUS | Status: DC

## 2013-03-05 NOTE — Progress Notes (Signed)
Patient did not have preoperative order for IV antibiotic SSI prophylaxis. (G8918)   

## 2013-03-05 NOTE — Patient Instructions (Addendum)

## 2013-03-05 NOTE — Progress Notes (Signed)
Called to room to assist during endoscopic procedure.  Patient ID and intended procedure confirmed with present staff. Received instructions for my participation in the procedure from the performing physician.  

## 2013-03-05 NOTE — Op Note (Addendum)
[facility] [address] [place], [address]   COLONOSCOPY PROCEDURE REPORT PATIENT: Lindsey Payne, Lindsey Payne  MR#: [id number] BIRTHDATE: 01-01-1951 , 61  yrs. old GENDER: Female ENDOSCOPIST: Meryl Dare, MD, Clementeen Graham REFERRED BY: Simone Curia, M.D. PROCEDURE DATE:  03/05/2013 PROCEDURE:   Colonoscopy with biopsy and snare polypectomy; injection sclerosis of internal hemorrhoids First Screening Colonoscopy - Avg.  risk and is 50 yrs.  old or older - No.  Prior Negative Screening - Now for repeat screening. N/A  History of Adenoma - Now for follow-up colonoscopy & has been > or = to 3 yrs.  Yes hx of adenoma.  Has been 3 or more years since last colonoscopy.  Polyps Removed Today? Yes. 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 MEDICATIONS: MAC sedation, administered by CRNA and propofol (Diprivan) 350mg  IV DESCRIPTION OF PROCEDURE:   After the risks benefits and alternatives of the procedure were thoroughly explained, informed consent was obtained.  A digital rectal exam revealed no abnormalities of the rectum.   The LB [id number]-H190 [id number]  endoscope was introduced through the anus and advanced to the surgical anastomosis. No adverse events experienced.   The quality of the prep was adequate, using MoviPrep. Extensive rinses and suctioning required to achieve an adequate prep. The instrument was then slowly withdrawn as the colon was fully examined.  COLON FINDINGS: Two sessile polyps measuring 4-5 mm in size were found in the rectum.  A polypectomy was performed with a cold snare.  The resection was complete and the polyp tissue was completely retrieved.  A polypectomy was performed with cold forceps.  The resection was complete and the polyp tissue was completely retrieved. Right hemicolectomy with a normal appearing anastomosis.  The colon was otherwise normal.   There was no diverticulosis, inflammation, polyps or cancers unless previously stated. Retroflexed views revealed small and erythematous internal hemorrhoids. 1.5 cc of 23.4% saline was injected into the internal hemorrhoids above the dentate line. The time to cecum=2 minutes 23 seconds.  Withdrawal time=16 minutes 33 seconds.  The scope was withdrawn and the procedure completed. COMPLICATIONS: There were no complications.  ENDOSCOPIC IMPRESSION: 1.   Two sessile polyps measuring 4-5 mm in the rectum; polypectomy performed with a cold snare; polypectomy performed with cold forceps 2.   Prior right hemicolectomy 3.   Small internal hemorrhoids; injection sclerosis performed  RECOMMENDATIONS: 1.  Await pathology results 2.  Repeat Colonoscopy in 5 years with a more extensive bowel prep.  eSigned:  Meryl Dare, MD, [facility] 03/05/2013 2:03 PM Revised: 03/05/2013 2:03 PM    PATIENT NAME:  Lindsey Payne, Lindsey Payne MR#: [id number]

## 2013-03-13 ENCOUNTER — Encounter: Payer: Medicare Other | Admitting: Gastroenterology

## 2013-03-13 ENCOUNTER — Encounter: Payer: Self-pay | Admitting: Gastroenterology

## 2013-03-16 ENCOUNTER — Ambulatory Visit
Admission: RE | Admit: 2013-03-16 | Discharge: 2013-03-16 | Disposition: A | Discharge status: Home / Self Care | Payer: Medicare Other | Source: Ambulatory Visit

## 2013-03-16 DIAGNOSIS — Z1231 Encounter for screening mammogram for malignant neoplasm of breast: Secondary | ICD-10-CM

## 2013-03-19 ENCOUNTER — Encounter: Payer: Self-pay | Admitting: Family Medicine

## 2013-03-19 ENCOUNTER — Other Ambulatory Visit: Payer: Self-pay | Admitting: Family Medicine

## 2013-03-19 DIAGNOSIS — N63 Unspecified lump in unspecified breast: Secondary | ICD-10-CM | POA: Insufficient documentation

## 2013-03-19 DIAGNOSIS — R928 Other abnormal and inconclusive findings on diagnostic imaging of breast: Secondary | ICD-10-CM

## 2013-03-20 ENCOUNTER — Encounter: Payer: Self-pay | Admitting: Family Medicine

## 2013-03-21 ENCOUNTER — Ambulatory Visit
Admission: RE | Admit: 2013-03-21 | Discharge: 2013-03-21 | Disposition: A | Discharge status: Home / Self Care | Payer: Medicare Other | Source: Ambulatory Visit | Attending: *Deleted | Admitting: *Deleted

## 2013-03-21 ENCOUNTER — Encounter: Payer: Self-pay | Admitting: Family Medicine

## 2013-03-21 DIAGNOSIS — R928 Other abnormal and inconclusive findings on diagnostic imaging of breast: Secondary | ICD-10-CM

## 2013-03-22 ENCOUNTER — Other Ambulatory Visit: Payer: Self-pay | Admitting: Family Medicine

## 2013-03-23 NOTE — Telephone Encounter (Signed)
Pt has appt 04/20/13. Lindsey Payne, Lindsey Payne

## 2013-03-23 NOTE — Telephone Encounter (Signed)
Refilling aldactone, isordil, and lisinopril. Please ask patient to set up f/u with me to see how BP is doing, as she is on multiple medications for BP; also would like to monitor Cr and K. Thanks.

## 2013-04-20 ENCOUNTER — Ambulatory Visit (INDEPENDENT_AMBULATORY_CARE_PROVIDER_SITE_OTHER): Payer: Medicare Other | Admitting: Family Medicine

## 2013-04-20 ENCOUNTER — Encounter: Payer: Self-pay | Admitting: Family Medicine

## 2013-04-20 VITALS — BP 114/79 | HR 73 | Temp 98.5°F | Wt 183.0 lb

## 2013-04-20 DIAGNOSIS — I10 Essential (primary) hypertension: Secondary | ICD-10-CM

## 2013-04-20 NOTE — Patient Instructions (Signed)
It was good to see you today.  For your blood pressure,  - Please write down a few BPs when you are calm and bring them to clinic - Come see me in 3 months for follow up - At that time we can decide if we want to stop your lasix or decrease it - Continue eating foods low in salt to help with your BP and keep your kidneys healthy. I am including a healthy diet.  - Keep working on stopping smoking.  DASH Diet The DASH diet stands for "Dietary Approaches to Stop Hypertension." It is a healthy eating plan that has been shown to reduce high blood pressure (hypertension) in as little as 14 days, while also possibly providing other significant health benefits. These other health benefits include reducing the risk of breast cancer after menopause and reducing the risk of type 2 diabetes, heart disease, colon cancer, and stroke. Health benefits also include weight loss and slowing kidney failure in patients with chronic kidney disease.  DIET GUIDELINES  Limit salt (sodium). Your diet should contain less than 1500 mg of sodium daily.  Limit refined or processed carbohydrates. Your diet should include mostly whole grains. Desserts and added sugars should be used sparingly.  Include small amounts of heart-healthy fats. These types of fats include nuts, oils, and tub margarine. Limit saturated and trans fats. These fats have been shown to be harmful in the body. CHOOSING FOODS  The following food groups are based on a 2000 calorie diet. See your Registered Dietitian for individual calorie needs. Grains and Grain Products (6 to 8 servings daily)  Eat More Often: Whole-wheat bread, brown rice, whole-grain or wheat pasta, quinoa, popcorn without added fat or salt (air popped).  Eat Less Often: White bread, white pasta, white rice, cornbread. Vegetables (4 to 5 servings daily)  Eat More Often: Fresh, frozen, and canned vegetables. Vegetables may be raw, steamed, roasted, or grilled with a minimal amount of  fat.  Eat Less Often/Avoid: Creamed or fried vegetables. Vegetables in a cheese sauce. Fruit (4 to 5 servings daily)  Eat More Often: All fresh, canned (in natural juice), or frozen fruits. Dried fruits without added sugar. One hundred percent fruit juice ( cup [237 mL] daily).  Eat Less Often: Dried fruits with added sugar. Canned fruit in light or heavy syrup. [employer], Fish, and Poultry (2 servings or less daily. One serving is 3 to 4 oz [85-114 g]).  Eat More Often: Ninety percent or leaner ground beef, tenderloin, sirloin. Round cuts of beef, chicken breast, [place] breast. All fish. Grill, bake, or broil your meat. Nothing should be fried.  Eat Less Often/Avoid: Fatty cuts of meat, [place], or chicken leg, thigh, or wing. Fried cuts of meat or fish. Dairy (2 to 3 servings)  Eat More Often: Low-fat or fat-free milk, low-fat plain or light yogurt, reduced-fat or part-skim cheese.  Eat Less Often/Avoid: Milk (whole, 2%).Whole milk yogurt. Full-fat cheeses. Nuts, Seeds, and Legumes (4 to 5 servings per week)  Eat More Often: All without added salt.  Eat Less Often/Avoid: Salted nuts and seeds, canned beans with added salt. Fats and Sweets (limited)  Eat More Often: Vegetable oils, tub margarines without trans fats, sugar-free gelatin. Mayonnaise and salad dressings.  Eat Less Often/Avoid: Coconut oils, palm oils, butter, stick margarine, cream, half and half, cookies, candy, pie. FOR MORE INFORMATION The Dash Diet Eating Plan: www.dashdiet.org Document Released: 06/17/2011 Document Revised: 09/20/2011 Document Reviewed: 06/17/2011 [facility] Patient Information 2014 [place], [place].

## 2013-04-20 NOTE — Assessment & Plan Note (Signed)
Well-controlled per 2 reported home values, not in clinic today, pt did not take medication today - continue current management with lisinopril, lasix, spironolactone, hydralazine, metoprolol, and imdur in pt with chronic systolic CHF.  - Likely unable to decrease/stop lasix to preserve Cr due to chronic systolic CHF managed by Dr. Donnie Aho - Normal Cr in 01/2013 - DASH diet and continue working on smoking cessation - Record values at home and bring journal - F/u in 3 months

## 2013-04-20 NOTE — Progress Notes (Signed)
Patient ID: Lindsey Payne, female   DOB: 1951-05-07, 62 y.o.   MRN: [id number] Subjective:   CC: Follow up BP  HPI:   1. F/u BP -Patient reports taking lisinopril, lasix, spironolactone, hydralazine, metoprolol, and imdur daily with no symptoms of hypo- or hypertension or side effects. Most recent creatinine WNL. She does not think she took all meds this morning due to rushing to get to appointment. She is still working on tobacco cessation, at 1-3 cigarettes daily (not ready to completely quit yet, confidence 3/10 for quitting). She wants to still take day by day. She does not check BP at home but can check at her sister's house once weekly. Thinks it has been systolic 120s x 2 in the recent past.   Review of Systems - Per HPI.   PMH: H/o chronic systolic CHF, follows with Dr. Donnie Aho    Objective:  Physical Exam BP 114/79  Pulse 73  Temp(Src) 98.5 F (36.9 C) (Oral)  Wt 183 lb (83.008 kg)  BMI 29.55 kg/m2 GEN: NAD, pleasant CV: RRR, no m/r/g PULM: CTAB, normal effort ABD: soft, nondistended    Assessment:     Lindsey Payne is a 62 y.o. female with h/o HTN, chronic systolic CHF here for follow-up of BP    Plan:     # See problem list for problem-specific plans. - Mammogram done and WNL - Declines STD testing - Check lipids at f/u in 3 months, also BP

## 2013-05-25 ENCOUNTER — Other Ambulatory Visit: Payer: Self-pay | Admitting: Family Medicine

## 2013-06-21 ENCOUNTER — Other Ambulatory Visit: Payer: Self-pay | Admitting: Family Medicine

## 2013-08-02 ENCOUNTER — Other Ambulatory Visit: Payer: Self-pay | Admitting: Family Medicine

## 2013-08-03 ENCOUNTER — Other Ambulatory Visit: Payer: Self-pay | Admitting: Family Medicine

## 2013-08-03 NOTE — Telephone Encounter (Signed)
Refilling all but patient is due for follow up of BP. Please help her to make an appt. We may need to recheck creatinine (kidney function) and potassium.  Hilton Sinclair, MD

## 2013-08-06 NOTE — Telephone Encounter (Signed)
Pt is aware of this and appt made for 08/15/13. Jazmin Hartsell,CMA

## 2013-08-15 ENCOUNTER — Encounter: Payer: Self-pay | Admitting: Family Medicine

## 2013-08-15 ENCOUNTER — Ambulatory Visit (INDEPENDENT_AMBULATORY_CARE_PROVIDER_SITE_OTHER): Payer: Medicare Other | Admitting: Family Medicine

## 2013-08-15 VITALS — BP 126/84 | HR 65 | Temp 98.1°F | Ht 66.0 in | Wt 187.0 lb

## 2013-08-15 DIAGNOSIS — I5022 Chronic systolic (congestive) heart failure: Secondary | ICD-10-CM

## 2013-08-15 DIAGNOSIS — I10 Essential (primary) hypertension: Secondary | ICD-10-CM

## 2013-08-15 DIAGNOSIS — F17200 Nicotine dependence, unspecified, uncomplicated: Secondary | ICD-10-CM

## 2013-08-15 DIAGNOSIS — E785 Hyperlipidemia, unspecified: Secondary | ICD-10-CM

## 2013-08-15 NOTE — Assessment & Plan Note (Signed)
Controlled, denies symptoms of hyper- or hypotension. Taking lisinopril, lasix, spironolactone, hydralazine, metoprolol, and isordil daily with h/o chronic systolic CHF.  - Continue current treatment. - Continue daily walking and working on smoking cessation. - Cr normal 01/2013. Come back for BMET and lipids when fasting. Future labs ordered. - F/u with Dr Wynonia Lawman (cardiologist) regularly. - F/u in 3-5 months.

## 2013-08-15 NOTE — Assessment & Plan Note (Signed)
Very motivated and relatively confident to quit, but feels she needs an extra push. Does not feel she is a candidate for nicotine patches and chantix has not worked for her before. - Recommended making appt with Dr Valentina Lucks for smoking cessation counseling/tools. Pt amenable.

## 2013-08-15 NOTE — Patient Instructions (Signed)
It was great to see you today!  We are going to check cholesterol and kidney function - Make a lab-only appointment any day this week or next before you have eaten breakfast or milk.  Follow up with Dr Wynonia Lawman.  Also, make an appointment with Dr Valentina Lucks to talk about smoking cessation. You are SO close!   See me again in 3-5 months.

## 2013-08-15 NOTE — Progress Notes (Signed)
Patient ID: Lindsey Payne, female   DOB: 1951/05/25, 63 y.o.   MRN: [id number] Subjective:   CC: F/u HTN  HPI:   Hypertension: Patient reports taking medications regularly (lasix, hydralazine, isordil, lisinopril, toprol-XL, and aldactone) and denies side effects. Does not report chest pain or SOB. Denies leg swelling. Reports checking [facility] when gets meds and it is 761P systolic. Still smoking but wants to quit. Walks to corner of her house daily.  Systolic CHF: Patient takes medications regularly. Denies lower extremity edema. Does not report PND or orthopnea. Has seen Dr Oran Rein in the past but worries about being unable to pay him and has not seen him yet in 1 year. Feels she needs to but hesitant.   Tobacco use: Patient smokes 3 cigarettes daily. Had decreased to 1 at last visit but increased back to 3 daily. - Motivation: 10/10 because she has been wanting to stop for a long time, her "play daughter" helps keep her motivated, and she knows how important it is. - Confidence: 7/10. Not higher because she is unsure why she just keeps smoking. Not lower because she is very motivated. She feels she needs a little help to get to 10/10 and actually quit, but has been told she cannot use nicotine patches by Dr Oran Rein (cardiologist) due to [place] of cancer, and has tried and failed chantix. - Contributing factors: Loves coffee, but after drinking a cup of coffee, feels she must have a cigarette. Feels because she has been smoking for years, it is very hard to stop.  - Reasons to quit: Cigarettes are starting to nauseate her and she wants to save money.  Review of Systems - Per HPI.   PMH: PMH and meds reviewed  SH:  - Tobacco use per above. - Proud of her son who is getting ready to be foster parent.   Objective:  Physical Exam BP 126/84  Pulse 65  Temp(Src) 98.1 F (36.7 C) (Oral)  Ht 5\' 6"  (1.676 m)  Wt 187 lb (84.823 kg)  BMI 30.20 kg/m2 GEN: NAD HEENT: Atraumatic,  normocephalic, neck supple, EOMI, sclera clear  CV: RRR, no murmurs, rubs, or gallops, no JVD PULM: CTAB, normal effort ABD: Soft, nontender, nondistended, no organomegaly SKIN: No rash or cyanosis; warm and well-perfused EXTR: No lower extremity edema or calf tenderness PSYCH: Mood and affect euthymic, normal rate and volume of speech NEURO: Awake, alert, no focal deficits grossly, normal speech  Assessment:     Lindsey Payne is a 63 y.o. female with h/o HTN and tobacco use here for follow-up of HTN.    Plan:     # See problem list and after visit summary for problem-specific plans.   # Health Maintenance:  - Make lab-only appt to check lipids when fasting.  Follow-up: - Follow up when convenient for future labs (lipids, BMET) and appt with Dr Valentina Lucks for smoking cessation. - F/u with me in 3-5 months.   Hilton Sinclair, MD [place]

## 2013-08-15 NOTE — Assessment & Plan Note (Signed)
Stable with no edema or reported dyspnea/PND/orthopnea. - Encouraged f/u with Oran Rein - Plans to try to see him every 1.5 instead of every 1 year, due to cost. Wants to stick with him because likes him.

## 2013-08-27 ENCOUNTER — Encounter: Payer: Self-pay | Admitting: Cardiology

## 2013-08-27 NOTE — Progress Notes (Signed)
Patient ID: Lindsey Payne, female   DOB: 07-Jul-1951, 63 y.o.   MRN: [id number]   Lindsey Payne, Lindsey Payne  Date of visit:  08/27/2013 DOB:  13-Aug-1950    Age:  63 yrs. Medical record number:  [id number]     Account number:  [id number] Primary Care Provider: MCDIARMID, TODD ____________________________ CURRENT DIAGNOSES  1. CAD,Native  2. Hyperlipidemia  3. Hypertensive Heart Disease-Benign without CHF  4. Obesity(BMI30-40)  5. Mitral Valve-Regurgitation  6. Hypertensive Heart Disease Nos ____________________________ ALLERGIES  Diazepam, Intolerance-unknown ____________________________ MEDICATIONS  1. furosemide 40 mg tablet, 1/2 tab b.i.d.  2. isosorbide dinitrate 20 mg tablet, BID  3. lisinopril 20 mg tablet, 1 p.o. daily  4. spironolactone 25 mg tablet, 1 p.o. daily  5. metoprolol succinate 25 mg tablet extended release 24 hr, BID  6. Crestor 20 mg tablet, 1/2 tab daily  7. hydralazine 50 mg tablet, B.I.D.  8. Klor-Con M20 mEq tablet,extended release, BID  9. aspirin 81 mg chewable tablet, 1 p.o. daily  10. paroxetine 40 mg tablet, 1 p.o. daily ____________________________ CHIEF COMPLAINTS  Followup of CAD,Native ____________________________ HISTORY OF PRESENT ILLNESS Patient seen for cardiac followup. She has had a good year since she was previously here. She has not been able to stop smoking but states that she has tried to see someone who will be able to help her to do this. She denies angina and she has no PND, orthopnea, syncope, or claudication. Says that her lipids are being done through her regular doctor. ____________________________ PAST HISTORY  Past Medical Illnesses:  hypertension, hyperlipidemia, obesity, lumbar disc disease, history of villous adenoma, history of pnemothorax;  Cardiovascular Illnesses:  CAD;  Surgical Procedures:  colectomy for cancer, laminectomy lumbar, hysterectomy, cesarean section;  Cardiology Procedures-Invasive:  cardiac cath (left) February  2007;  Cardiology Procedures-Noninvasive:  echocardiogram 2011;  Cardiac Cath Results:  normal Left main, 30% stenosis ostial LAD, 40% stenosis ostial RCA;  LVEF of 45-50% documented via echocardiogram on 07/16/2009,   ____________________________ CARDIO-PULMONARY TEST DATES EKG Date:  08/27/2013;   Cardiac Cath Date:  08/26/2005;  Nuclear Study Date:  09/08/1998;  Echocardiography Date: 07/16/2009;   ____________________________ FAMILY HISTORY Brother -- Brother alive with problem, Hypertension, Coronary Artery Disease Brother -- Brother alive with problem, Coronary Artery Disease, Hypertension Father -- Father dead, Bowel cancer, History of coronary artery bypass grafting Mother -- Mother dead, Carcinoma of breast Sister -- Sister alive with problem, No  breast carcinoma, Hypertension Sister -- Sister alive and well ____________________________ SOCIAL HISTORY Alcohol Use:  does not use alcohol;  Smoking:  smokes less than 1 ppd;  Diet:  regular diet;  Lifestyle:  widowed;  Exercise:  no regular exercise;  Occupation:  disabled;  Residence:  lives alone;   ____________________________ REVIEW OF SYSTEMS General:  malaise and fatigue Eyes: denies diplopia, history of glaucoma or visual problems., wears eye glasses/contact lenses Respiratory: denies dyspnea, cough, wheezing or hemoptysis. Cardiovascular:  please review HPI Abdominal: denies dyspepsia, GI bleeding, constipation, or diarrheaGenitourinary-Female: no dysuria, urgency, frequency, UTIs, or stress incontinence Musculoskeletal:  chronic low back pain Neurological:  denies headaches, stroke, or TIA  ____________________________ PHYSICAL EXAMINATION VITAL SIGNS  Blood Pressure:  110/70 Sitting, Right arm, regular cuff  , 116/72 Standing, Right arm and regular cuff   Pulse:  70/min. Weight:  187.00 lbs. Height:  69"BMI: 27  Constitutional:  pleasant African American female in no acute distress, mildly obese Skin:  warm and dry to touch,  no apparent skin lesions, or masses noted. Head:  normocephalic, normal hair pattern, no masses or tenderness ENT:  ears, nose and throat reveal no gross abnormalities.  Dentition good. Neck:  supple, no masses, thyromegaly, JVD. Carotid pulses are full and equal bilaterally without bruits. Chest:  normal symmetry, clear to auscultation and percussion. Cardiac:  regular rhythm, normal S1 and S2, No S3 or S4, no murmurs, gallops or rubs detected. Peripheral Pulses:  the femoral,dorsalis pedis, and posterior tibial pulses are full and equal bilaterally with no bruits auscultated. Neurological:  foot drop left ____________________________ MOST RECENT LIPID PANEL 07/14/12  CHOL TOTL 167 mg/dl, LDL 76 calc, HDL 51 mg/dl, TRIGLYCER 199 mg/dl and CHOL/HDL 3.3 (Calc) ____________________________ IMPRESSIONS/PLAN  1. Coronary artery disease 2. Hypertensive heart disease 3. Hyperlipidemia under treatment for the recent lipid panel  Recommendations:  Importance of smoking cessation again discussed with her. Followup again in one year. Call if recurrent problems. ____________________________ TODAYS ORDERS  1. 12 Lead EKG: Today  2. Return Visit: 1 year  3. 12 Lead EKG: 1 year                       ____________________________ Cardiology Physician:  Kerry Hough MD [facility]

## 2013-09-14 ENCOUNTER — Ambulatory Visit: Payer: Medicare Other | Admitting: Pharmacist

## 2013-09-14 ENCOUNTER — Other Ambulatory Visit: Payer: Medicare Other

## 2013-09-14 DIAGNOSIS — I10 Essential (primary) hypertension: Secondary | ICD-10-CM

## 2013-09-14 DIAGNOSIS — E785 Hyperlipidemia, unspecified: Secondary | ICD-10-CM

## 2013-09-14 LAB — BASIC METABOLIC PANEL
BUN: 18 mg/dL (ref 6–23)
CALCIUM: 9.6 mg/dL (ref 8.4–10.5)
CO2: 27 meq/L (ref 19–32)
Chloride: 102 mEq/L (ref 96–112)
Creat: 1.13 mg/dL — ABNORMAL HIGH (ref 0.50–1.10)
GLUCOSE: 92 mg/dL (ref 70–99)
Potassium: 4.2 mEq/L (ref 3.5–5.3)
SODIUM: 138 meq/L (ref 135–145)

## 2013-09-14 LAB — LIPID PANEL
CHOL/HDL RATIO: 5.4 ratio
CHOLESTEROL: 285 mg/dL — AB (ref 0–200)
HDL: 53 mg/dL (ref >39)
LDL Cholesterol: 184 mg/dL — ABNORMAL HIGH (ref 0–99)
TRIGLYCERIDES: 241 mg/dL — AB (ref <150)
VLDL: 48 mg/dL — ABNORMAL HIGH (ref 0–40)

## 2013-09-14 NOTE — Progress Notes (Signed)
BMP AND FLP DONE TODAY Lindsey Payne

## 2013-09-17 ENCOUNTER — Telehealth: Payer: Self-pay | Admitting: Family Medicine

## 2013-09-17 MED ORDER — ROSUVASTATIN CALCIUM 20 MG PO TABS: 20.0000 mg | ORAL_TABLET | Freq: Every day | ORAL | Status: DC

## 2013-09-17 NOTE — Progress Notes (Addendum)
See my phone note for plan.  Hilton Sinclair, MD

## 2013-09-17 NOTE — Telephone Encounter (Signed)
Pt informed. Lindsey Payne  

## 2013-09-17 NOTE — Telephone Encounter (Signed)
Please let patient know that her BMET was relatively unchanged but her cholesterol and triglyceride were a bit higher. I would like her to increase from crestor (rosuvastatin) 10 mg up to 20mg  daily (which she used to be on and for some reason was decreased to 10mg  daily) to improve her cholesterol, and will send this changed prescription to  and we can recheck in 6 months. At her next follow up with me, we can discuss if she would like medication to decrease her triglycerides. In the meantime, she should work on increasing exercise and eating a diet low in total and saturated fat and high in fruits and vegetables.  Hilton Sinclair, MD

## 2013-09-27 ENCOUNTER — Other Ambulatory Visit: Payer: Self-pay | Admitting: Family Medicine

## 2013-10-28 ENCOUNTER — Other Ambulatory Visit: Payer: Self-pay | Admitting: Family Medicine

## 2013-11-29 ENCOUNTER — Other Ambulatory Visit: Payer: Self-pay | Admitting: Family Medicine

## 2013-12-27 ENCOUNTER — Other Ambulatory Visit: Payer: Self-pay | Admitting: *Deleted

## 2013-12-27 NOTE — Telephone Encounter (Signed)
Refill request for 90 day supply.  Martin, Tamika L, RN  

## 2013-12-28 MED ORDER — ISOSORBIDE DINITRATE 20 MG PO TABS: ORAL_TABLET | ORAL | Status: DC

## 2013-12-28 MED ORDER — SPIRONOLACTONE 25 MG PO TABS: ORAL_TABLET | ORAL | Status: DC

## 2013-12-28 MED ORDER — FUROSEMIDE 40 MG PO TABS: ORAL_TABLET | ORAL | Status: DC

## 2013-12-28 MED ORDER — LISINOPRIL 20 MG PO TABS: ORAL_TABLET | ORAL | Status: DC

## 2013-12-31 ENCOUNTER — Telehealth: Payer: Self-pay | Admitting: *Deleted

## 2013-12-31 MED ORDER — LISINOPRIL 20 MG PO TABS: ORAL_TABLET | ORAL | Status: DC

## 2013-12-31 MED ORDER — ISOSORBIDE DINITRATE 20 MG PO TABS: ORAL_TABLET | ORAL | Status: DC

## 2013-12-31 MED ORDER — FUROSEMIDE 40 MG PO TABS: ORAL_TABLET | ORAL | Status: DC

## 2013-12-31 MED ORDER — SPIRONOLACTONE 25 MG PO TABS
ORAL_TABLET | ORAL | Status: DC
Start: 2013-12-31 — End: 2014-04-24

## 2013-12-31 NOTE — Telephone Encounter (Signed)
Filled. Thank you.  Hilton Sinclair, MD PGY-2, [place]

## 2013-12-31 NOTE — Telephone Encounter (Signed)
Received another refill for 90 day supply of the following medications:  Isosorbide 20 mg; spironolactone 25 mg; lisinopril 20 mg and furosemide 40 mg.  Derl Barrow, RN

## 2014-01-26 ENCOUNTER — Other Ambulatory Visit: Payer: Self-pay | Admitting: Family Medicine

## 2014-02-12 ENCOUNTER — Other Ambulatory Visit: Payer: Self-pay

## 2014-02-12 DIAGNOSIS — Z1231 Encounter for screening mammogram for malignant neoplasm of breast: Secondary | ICD-10-CM

## 2014-03-19 ENCOUNTER — Ambulatory Visit: Payer: Medicare Other

## 2014-03-22 ENCOUNTER — Encounter (HOSPITAL_COMMUNITY): Payer: Self-pay | Admitting: Emergency Medicine

## 2014-03-22 ENCOUNTER — Emergency Department (HOSPITAL_COMMUNITY)
Admission: EM | Admit: 2014-03-22 | Discharge: 2014-03-22 | Disposition: A | Discharge status: Home / Self Care | Payer: Medicare Other | Attending: Emergency Medicine | Admitting: Emergency Medicine

## 2014-03-22 DIAGNOSIS — F3289 Other specified depressive episodes: Secondary | ICD-10-CM | POA: Insufficient documentation

## 2014-03-22 DIAGNOSIS — F17200 Nicotine dependence, unspecified, uncomplicated: Secondary | ICD-10-CM | POA: Diagnosis not present

## 2014-03-22 DIAGNOSIS — I10 Essential (primary) hypertension: Secondary | ICD-10-CM | POA: Diagnosis not present

## 2014-03-22 DIAGNOSIS — M129 Arthropathy, unspecified: Secondary | ICD-10-CM | POA: Diagnosis not present

## 2014-03-22 DIAGNOSIS — Z7982 Long term (current) use of aspirin: Secondary | ICD-10-CM | POA: Diagnosis not present

## 2014-03-22 DIAGNOSIS — Z888 Allergy status to other drugs, medicaments and biological substances status: Secondary | ICD-10-CM | POA: Diagnosis not present

## 2014-03-22 DIAGNOSIS — I509 Heart failure, unspecified: Secondary | ICD-10-CM | POA: Insufficient documentation

## 2014-03-22 DIAGNOSIS — H9209 Otalgia, unspecified ear: Secondary | ICD-10-CM | POA: Insufficient documentation

## 2014-03-22 DIAGNOSIS — H60399 Other infective otitis externa, unspecified ear: Secondary | ICD-10-CM | POA: Diagnosis not present

## 2014-03-22 DIAGNOSIS — M549 Dorsalgia, unspecified: Secondary | ICD-10-CM | POA: Diagnosis not present

## 2014-03-22 DIAGNOSIS — F329 Major depressive disorder, single episode, unspecified: Secondary | ICD-10-CM | POA: Insufficient documentation

## 2014-03-22 DIAGNOSIS — J45909 Unspecified asthma, uncomplicated: Secondary | ICD-10-CM | POA: Diagnosis not present

## 2014-03-22 DIAGNOSIS — E785 Hyperlipidemia, unspecified: Secondary | ICD-10-CM | POA: Diagnosis not present

## 2014-03-22 DIAGNOSIS — H6091 Unspecified otitis externa, right ear: Secondary | ICD-10-CM

## 2014-03-22 MED ORDER — ANTIPYRINE-BENZOCAINE 5.4-1.4 % OT SOLN
3.0000 [drp] | Freq: Once | OTIC | Status: AC
  Administered 2014-03-22: 3 [drp] via OTIC
  Filled 2014-03-22: qty 10

## 2014-03-22 MED ORDER — IBUPROFEN 400 MG PO TABS
800.0000 mg | ORAL_TABLET | Freq: Once | ORAL | Status: AC
  Administered 2014-03-22: 800 mg via ORAL
  Filled 2014-03-22: qty 2

## 2014-03-22 MED ORDER — CIPROFLOXACIN-DEXAMETHASONE 0.3-0.1 % OT SUSP
4.0000 [drp] | Freq: Once | OTIC | Status: DC
  Filled 2014-03-22: qty 7.5

## 2014-03-22 MED ORDER — IBUPROFEN 800 MG PO TABS: 800.0000 mg | ORAL_TABLET | Freq: Three times a day (TID) | ORAL | Status: DC

## 2014-03-22 NOTE — ED Notes (Signed)
Pt inquiring about wait time. Updated that she is next to be seen by provider.

## 2014-03-22 NOTE — Discharge Instructions (Signed)
Apply ciprodex drops, 4 drops in right ear twice a day. Auralgan 4 drops every 2-4 hrs as needed for pain. Ibuprofen for pain. Follow up with your doctor for recheck.    Otitis Externa Otitis externa is a bacterial or fungal infection of the outer ear canal. This is the area from the eardrum to the outside of the ear. Otitis externa is sometimes called "swimmer's ear." CAUSES  Possible causes of infection include:  Swimming in dirty water.  Moisture remaining in the ear after swimming or bathing.  Mild injury (trauma) to the ear.  Objects stuck in the ear (foreign body).  Cuts or scrapes (abrasions) on the outside of the ear. SIGNS AND SYMPTOMS  The first symptom of infection is often itching in the ear canal. Later signs and symptoms may include swelling and redness of the ear canal, ear pain, and yellowish-white fluid (pus) coming from the ear. The ear pain may be worse when pulling on the earlobe. DIAGNOSIS  Your health care provider will perform a physical exam. A sample of fluid may be taken from the ear and examined for bacteria or fungi. TREATMENT  Antibiotic ear drops are often given for 10 to 14 days. Treatment may also include pain medicine or corticosteroids to reduce itching and swelling. HOME CARE INSTRUCTIONS   Apply antibiotic ear drops to the ear canal as prescribed by your health care provider.  Take medicines only as directed by your health care provider.  If you have diabetes, follow any additional treatment instructions from your health care provider.  Keep all follow-up visits as directed by your health care provider. PREVENTION   Keep your ear dry. Use the corner of a towel to absorb water out of the ear canal after swimming or bathing.  Avoid scratching or putting objects inside your ear. This can damage the ear canal or remove the protective wax that lines the canal. This makes it easier for bacteria and fungi to grow.  Avoid swimming in lakes, polluted  water, or poorly chlorinated pools.  You may use ear drops made of rubbing alcohol and vinegar after swimming. Combine equal parts of white vinegar and alcohol in a bottle. Put 3 or 4 drops into each ear after swimming. SEEK MEDICAL CARE IF:   You have a fever.  Your ear is still red, swollen, painful, or draining pus after 3 days.  Your redness, swelling, or pain gets worse.  You have a severe headache.  You have redness, swelling, pain, or tenderness in the area behind your ear. MAKE SURE YOU:   Understand these instructions.  Will watch your condition.  Will get help right away if you are not doing well or get worse. Document Released: 06/28/2005 Document Revised: 11/12/2013 Document Reviewed: 07/15/2011 [facility] Patient Information 2015 [place], [place]. This information is not intended to replace advice given to you by your health care provider. Make sure you discuss any questions you have with your health care provider.

## 2014-03-22 NOTE — ED Provider Notes (Signed)
CSN: [id number]     Arrival date & time 03/22/14  1339 History  This chart was scribed for non-physician practitioner, [name], PA-C,working with [name], MD, by Marlowe Kays, ED Scribe. This patient was seen in room TR09C/TR09C and the patient's care was started at 3:45 PM.  Chief Complaint  Patient presents with  . Otalgia   Patient is a 63 y.o. female presenting with ear pain. The history is provided by the patient. No language interpreter was used.  Otalgia Associated symptoms: no ear discharge, no fever and no headaches    HPI Comments:  Lindsey Payne is a 63 y.o. female who presents to the Emergency Department complaining of severe right ear pain that started one week ago. Pt reports putting peroxide in her ear. She states she did not call her PCP because she did not think she would be able to be seen. She denies drainage from the ear, fever or chills. No decreased hearing. Nothing makes pain better or worse. Pain is 10/10  Past Medical History  Diagnosis Date  . Arthritis   . Asthma   . Depression   . Hyperlipidemia   . Hypertension   . Adenoma of large intestine   . Back pain   . CHF (congestive heart failure)   . Tubular adenoma   . Hemorrhoids    Past Surgical History  Procedure Laterality Date  . Colon surgery  1987    benign tubulovillous adenoma  . Heart stints      Dr. Wynonia Lawman  . Tonsillectomy    . Lung surgery Left     for collasped lung  . Abdominal hysterectomy      has one ovary left  . Back surgery      3 disc removed   Family History  Problem Relation Age of Onset  . Breast cancer Mother   . Colon cancer Father 37    died  . Diabetes Father   . Diabetes Brother   . Heart disease Father    History  Substance Use Topics  . Smoking status: Current Every Day Smoker -- 0.25 packs/day    Types: Cigarettes  . Smokeless tobacco: Never Used     Comment: Tobacco info given 02/28/13  . Alcohol Use: 0.6 oz/week    1 Cans of beer  per week     Comment: beer occasionally   OB History   Grav Para Term Preterm Abortions TAB SAB Ect Mult Living                 Review of Systems  Constitutional: Negative for fever and chills.  HENT: Positive for ear pain. Negative for ear discharge.   Neurological: Negative for headaches.  Hematological: Does not bruise/bleed easily.    Allergies  Valium  Home Medications   Prior to Admission medications   Medication Sig Start Date End Date Taking? Authorizing Provider  aspirin 81 MG EC tablet TAKE 1 TABLET BY MOUTH EVERY DAY   Yes Hilton Sinclair, MD  furosemide (LASIX) 40 MG tablet TAKE 1/2 TABLET TWICE A DAY 12/31/13  Yes Hilton Sinclair, MD  hydrALAZINE (APRESOLINE) 50 MG tablet 50 mg 2 (two) times daily.  04/23/12  Yes Historical Provider, MD  isosorbide dinitrate (ISORDIL) 20 MG tablet TAKE 1 TABLET BY MOUTH TWICE A DAY 12/31/13  Yes Hilton Sinclair, MD  KLOR-CON M20 20 MEQ tablet Take 20 mEq by mouth 2 (two) times daily.  04/23/12  Yes Historical Provider, MD  lisinopril (PRINIVIL,ZESTRIL) 20 MG tablet TAKE 1 TABLET (20 MG TOTAL) BY MOUTH DAILY. 12/31/13  Yes Hilton Sinclair, MD  metoprolol succinate (TOPROL-XL) 25 MG 24 hr tablet TAKE 1 TABLET TWICE A DAY 02/10/13  Yes Hilton Sinclair, MD  PARoxetine (PAXIL) 40 MG tablet TAKE 1 TABLET (40 MG TOTAL) BY MOUTH EVERY MORNING.   Yes Hilton Sinclair, MD  spironolactone (ALDACTONE) 25 MG tablet TAKE 1 TABLET (25 MG TOTAL) BY MOUTH DAILY. 12/31/13  Yes Hilton Sinclair, MD  ibuprofen (ADVIL,MOTRIN) 800 MG tablet Take 1 tablet (800 mg total) by mouth 3 (three) times daily. 03/22/14   Sherill Wegener A Medford Staheli, PA-C   Triage Vitals: BP 111/61  Pulse 101  Temp(Src) 97.9 F (36.6 C) (Oral)  Resp 18  Ht 5\' 8"  (1.727 m)  Wt 187 lb (84.823 kg)  BMI 28.44 kg/m2  SpO2 99% Physical Exam  Nursing note and vitals reviewed. Constitutional: She is oriented to person, place, and time. She appears well-developed  and well-nourished.  HENT:  Head: Normocephalic and atraumatic.  Right Ear: There is drainage and swelling.  Left Ear: Tympanic membrane, external ear and ear canal normal.  Nose: Nose normal.  Mouth/Throat: Uvula is midline, oropharynx is clear and moist and mucous membranes are normal.  Eyes: EOM are normal.  Neck: Normal range of motion.  Cardiovascular: Normal rate.   Pulmonary/Chest: Effort normal.  Musculoskeletal: Normal range of motion.  Neurological: She is alert and oriented to person, place, and time.  Skin: Skin is warm and dry.  Psychiatric: She has a normal mood and affect. Her behavior is normal.    ED Course  Procedures (including critical care time) DIAGNOSTIC STUDIES: Oxygen Saturation is 99% on RA, normal by my interpretation.   COORDINATION OF CARE: 3:48 PM- Will prescribe Ciprodex otic drops and Auralgan otic drops. Advised pt to take OTC Advil or Tylenol for pain. Pt verbalizes understanding and agrees to plan.  Medications  ciprofloxacin-dexamethasone (CIPRODEX) 0.3-0.1 % otic suspension 4 drop (not administered)  antipyrine-benzocaine (AURALGAN) otic solution 3-4 drop (not administered)  ibuprofen (ADVIL,MOTRIN) tablet 800 mg (not administered)    Labs Review Labs Reviewed - No data to display  Imaging Review No results found.   EKG Interpretation None      MDM   Final diagnoses:  Otitis externa, right    Patient with right ear pain, exam consistent with otitis externa. Patient has had pain in her ear for one week. Patient did not follow with her Dr. because "he had to make an appointment." Patient has been putting peroxide in her ear with no relief in pain. She denies any fever or chills. No other complaints, including no other upper respiratory symptoms. Will start on Ciprodex drops, R. Roldan for pain, ibuprofen.  Patient very angry about waiting for 2 hours to be seen, explained to patient that we are busy, she stated "all nobody told me  that." Patient requested to speak discharge nurse, charge nurse called. Pt on the phone during exam and interview, on the speaker, the entire time.  Patient discharged home in stable condition.  Filed Vitals:   03/22/14 1346 03/22/14 1605  BP: 111/61 137/80  Pulse: 101 99  Temp: 97.9 F (36.6 C) 97.9 F (36.6 C)  TempSrc: Oral Oral  Resp: 18 18  Height: 5\' 8"  (1.727 m)   Weight: 187 lb (84.823 kg)   SpO2: 99%     I personally performed the services described in this documentation, which was scribed  in my presence. The recorded information has been reviewed and is accurate.    Renold Genta, PA-C 03/22/14 1657

## 2014-03-22 NOTE — ED Notes (Signed)
Pt has been having right ear pain for a week now and has been putting peroxide in her ear.

## 2014-04-02 NOTE — ED Provider Notes (Signed)
Medical screening examination/treatment/procedure(s) were performed by non-physician practitioner and as supervising physician I was immediately available for consultation/collaboration.   EKG Interpretation None      Rolland Porter, MD, [facility]   [name], MD 04/02/14 [phone]

## 2014-04-16 ENCOUNTER — Ambulatory Visit
Admission: RE | Admit: 2014-04-16 | Discharge: 2014-04-16 | Disposition: A | Discharge status: Home / Self Care | Payer: Medicare HMO | Source: Ambulatory Visit

## 2014-04-16 DIAGNOSIS — Z1231 Encounter for screening mammogram for malignant neoplasm of breast: Secondary | ICD-10-CM

## 2014-04-24 ENCOUNTER — Other Ambulatory Visit: Payer: Self-pay | Admitting: *Deleted

## 2014-04-26 ENCOUNTER — Encounter: Payer: Self-pay | Admitting: Family Medicine

## 2014-04-26 MED ORDER — FUROSEMIDE 40 MG PO TABS: ORAL_TABLET | ORAL | Status: DC

## 2014-04-26 MED ORDER — SPIRONOLACTONE 25 MG PO TABS: ORAL_TABLET | ORAL | Status: DC

## 2014-04-26 MED ORDER — LISINOPRIL 20 MG PO TABS: ORAL_TABLET | ORAL | Status: DC

## 2014-04-26 MED ORDER — POTASSIUM CHLORIDE CRYS ER 20 MEQ PO TBCR: 20.0000 meq | EXTENDED_RELEASE_TABLET | Freq: Two times a day (BID) | ORAL | Status: DC

## 2014-04-26 NOTE — Progress Notes (Signed)
Patient ID: Lindsey Payne, female   DOB: [birth year], 63 y.o.   MRN: [id number]  Filled medications (see refill encounter today) for HTN. Patient has appointment with me early Nov to discuss HTN.  Hilton Sinclair, MD

## 2014-05-02 ENCOUNTER — Other Ambulatory Visit: Payer: Self-pay | Admitting: *Deleted

## 2014-05-06 MED ORDER — PAROXETINE HCL 40 MG PO TABS: ORAL_TABLET | ORAL | Status: DC

## 2014-05-06 NOTE — Telephone Encounter (Signed)
I have refilled paxil. Please let Lindsey Payne know looks like she would have been out of this in April 2015 - if that is correct, she should start back at 20mg  daily and increase after 1 week to 40mg  daily.  Hilton Sinclair, MD

## 2014-05-06 NOTE — Telephone Encounter (Signed)
Pt informed.  She has been taking the Paxil, there was no lapse in that medication.  She will continue 40mg s. Kerron Sedano, Salome Spotted

## 2014-05-16 ENCOUNTER — Encounter: Payer: Self-pay | Admitting: Family Medicine

## 2014-05-16 ENCOUNTER — Ambulatory Visit (INDEPENDENT_AMBULATORY_CARE_PROVIDER_SITE_OTHER): Payer: Medicare HMO | Admitting: Family Medicine

## 2014-05-16 VITALS — BP 150/98 | HR 92 | Ht 68.0 in | Wt 189.6 lb

## 2014-05-16 DIAGNOSIS — E785 Hyperlipidemia, unspecified: Secondary | ICD-10-CM

## 2014-05-16 DIAGNOSIS — F17200 Nicotine dependence, unspecified, uncomplicated: Secondary | ICD-10-CM

## 2014-05-16 DIAGNOSIS — I10 Essential (primary) hypertension: Secondary | ICD-10-CM

## 2014-05-16 DIAGNOSIS — Z720 Tobacco use: Secondary | ICD-10-CM

## 2014-05-16 LAB — COMPREHENSIVE METABOLIC PANEL
ALBUMIN: 3.8 g/dL (ref 3.5–5.2)
ALT: 15 U/L (ref 0–35)
AST: 17 U/L (ref 0–37)
Alkaline Phosphatase: 72 U/L (ref 39–117)
BUN: 12 mg/dL (ref 6–23)
CALCIUM: 9.3 mg/dL (ref 8.4–10.5)
CHLORIDE: 103 meq/L (ref 96–112)
CO2: 28 meq/L (ref 19–32)
CREATININE: 0.95 mg/dL (ref 0.50–1.10)
Glucose, Bld: 106 mg/dL — ABNORMAL HIGH (ref 70–99)
Potassium: 3.4 mEq/L — ABNORMAL LOW (ref 3.5–5.3)
Sodium: 138 mEq/L (ref 135–145)
Total Bilirubin: 0.3 mg/dL (ref 0.2–1.2)
Total Protein: 6.2 g/dL (ref 6.0–8.3)

## 2014-05-16 NOTE — Patient Instructions (Signed)
Good to see you.  Your BP is not well controlled. Call with 3 values this week and let me know what they are. This is likely because of not being on your BP medication for a while. Continue to work on quitting smoking.  Restart cholesterol medication and take daily.  We are checking labs today and I will call if any are NOT normal.  Best,  Hilton Sinclair, MD

## 2014-05-16 NOTE — Progress Notes (Signed)
Patient ID: Lindsey Payne, female   DOB: Jul 06, 1951, 63 y.o.   MRN: [id number] Subjective:   CC: F/u HTN  HPI:   Hypertension: Patient here for follow-up of elevated blood pressure. She is exercising (walks 3-4 hours by staying active at home doing work, cannot walk far due to leg pain) and is not adherent to low salt diet (sometimes eats high salt foods).  Blood pressure is well controlled at home; did not check when without meds. Cardiac symptoms none. Patient denies chest pain, chest pressure/discomfort, dyspnea, exertional chest pressure/discomfort, near-syncope, orthopnea, paroxysmal nocturnal dyspnea and syncope.  Cardiovascular risk factors: dyslipidemia, family history of premature cardiovascular disease (brother), hypertension and smoking/ tobacco exposure. Use of agents associated with hypertension: none. History of target organ damage: heart failure. Saw Dr Oran Rein a few months ago, said all is okay. H/o CAD, HTN heart disease, HLD.   Review of Systems - Per HPI.   PMH: Reviewed Smoking status: continuing to try to quit; smokes 1/3 PPD.    Objective:  Physical Exam BP 150/98 mmHg  Pulse 92  Ht 5\' 8"  (1.727 m)  Wt 189 lb 9.6 oz (86.002 kg)  BMI 28.84 kg/m2 GEN: NAD CV: RRR, 2+ bilateral radial pulses; no JVD PULM: CTAB, normal effort EXTR: Trace LE edema bilaterally.    Assessment:     Lindsey Payne is a 63 y.o. female with h/o HTN, CAD, and HTN heart disease here for follow up of HTN.    Plan:     # See problem list and after visit summary for problem-specific plans.  # Health Maintenance: Not discussed.  Follow-up: Follow up in 3 months for HTN f/u. REturn when convenient to discuss chronic leg pain.   Hilton Sinclair, MD [place]

## 2014-05-17 ENCOUNTER — Encounter: Payer: Self-pay | Admitting: Family Medicine

## 2014-05-19 NOTE — Assessment & Plan Note (Signed)
Due for recheck. Stopped crestor 2-3 years ago because "scared of it." - Discussed importance and benefit of statin and side effects to look out for. - Restart crestor. - Check lipid panel and CMET.

## 2014-05-19 NOTE — Assessment & Plan Note (Signed)
BP poorly controlled today but 1 month without medications and just restarted a week ago. Significant CV risk factors (HLD, FH, HTN, tobacco exposure). H/o heart failure, CAD. Sees Dr Oran Rein.  - Work on increased exercise. Joining a gym. - Work on decreased salt intake. - Discussed importance of completely quitting smoking. Has cut down quite a bit. - Lipid panel. Restart crestor. - CMET - Check 2-3 times at drug store this week and call with values.

## 2014-05-19 NOTE — Assessment & Plan Note (Signed)
COntinues trying to cut down.  - Discussed importance of completely quitting in setting of HTN heart failure and CAD.  - Pt voiced understanding.

## 2014-06-18 ENCOUNTER — Other Ambulatory Visit: Payer: Self-pay | Admitting: *Deleted

## 2014-06-18 MED ORDER — POTASSIUM CHLORIDE CRYS ER 20 MEQ PO TBCR: 20.0000 meq | EXTENDED_RELEASE_TABLET | Freq: Two times a day (BID) | ORAL | Status: DC

## 2014-06-18 MED ORDER — LISINOPRIL 20 MG PO TABS: ORAL_TABLET | ORAL | Status: DC

## 2014-06-18 MED ORDER — FUROSEMIDE 40 MG PO TABS: ORAL_TABLET | ORAL | Status: DC

## 2014-06-18 NOTE — Telephone Encounter (Signed)
Filled paroxetine 04/2014 with quantity/refills sufficient for 1 year. Has 3 refills on it so refusing request. Hilton Sinclair, MD

## 2014-06-24 MED ORDER — SPIRONOLACTONE 25 MG PO TABS: ORAL_TABLET | ORAL | Status: DC

## 2014-08-09 ENCOUNTER — Telehealth: Payer: Self-pay | Admitting: Family Medicine

## 2014-08-09 NOTE — Telephone Encounter (Signed)
[facility] for a callback.  More than likely closed at this time [employer]

## 2014-08-09 NOTE — Telephone Encounter (Signed)
[facility] called and need a referral to see the patient she is there now. Patient is there for a Cataract evaluation. jw

## 2014-08-19 ENCOUNTER — Telehealth: Payer: Self-pay | Admitting: Family Medicine

## 2014-08-19 NOTE — Telephone Encounter (Signed)
Estill Bamberg from [facility] called and needs the silverback authorization for the patient to be see. The patient is there now for her appointment. Please call Estill Bamberg at [phone] once we get the authorization. jw

## 2014-08-19 NOTE — Telephone Encounter (Signed)
Spoke with Estill Bamberg.  Referral was placed into [employer] portal, but has been suspended at this time. Chenae Brager, Salome Spotted

## 2014-10-23 ENCOUNTER — Encounter: Payer: Self-pay | Admitting: Family Medicine

## 2014-10-23 ENCOUNTER — Ambulatory Visit (INDEPENDENT_AMBULATORY_CARE_PROVIDER_SITE_OTHER): Payer: Medicare HMO | Admitting: Family Medicine

## 2014-10-23 VITALS — BP 115/80 | HR 82 | Temp 97.6°F | Wt 187.4 lb

## 2014-10-23 DIAGNOSIS — M67431 Ganglion, right wrist: Secondary | ICD-10-CM

## 2014-10-23 DIAGNOSIS — M545 Low back pain: Secondary | ICD-10-CM | POA: Insufficient documentation

## 2014-10-23 DIAGNOSIS — M5416 Radiculopathy, lumbar region: Secondary | ICD-10-CM | POA: Diagnosis not present

## 2014-10-23 DIAGNOSIS — M67439 Ganglion, unspecified wrist: Secondary | ICD-10-CM | POA: Insufficient documentation

## 2014-10-23 DIAGNOSIS — M79604 Pain in right leg: Secondary | ICD-10-CM | POA: Insufficient documentation

## 2014-10-23 NOTE — Assessment & Plan Note (Signed)
Right volar wrist ganglion cyst, mildly tender, with no signs of erythema or inflammation. -Patient opts to monitor for now as she does not want this currently aspirated or surgically removed. -Discussed trying to wear compression sleeve in the daytime which may help. Otherwise, discussed natural course of this is that it often involutes.

## 2014-10-23 NOTE — Patient Instructions (Signed)
Good to see you today. It sounds like you have right hip arthritis. However, let us further evaluate with low back and hip x-rays. Make sure to use cane while walking to avoid a fall. You can use heat or ice for relief. Also, use naproxen 1-2 tablets twice daily with food for 5 days. Stop if you have any stomach irritation or bleeding. You can also use Tylenol occasionally in between. Remain active as this definitely helps. Follow-up with me in 3-4 weeks and we can see how you're doing. Work in the meantime on strengthening core.  You have a ganglion cyst on your wrist. Since this is not really bothering you, we can watch and see if it goes away on its own. If it starts to become bothersome and you would like to discuss getting it removed or tried to aspirate it, let me know.

## 2014-10-23 NOTE — Assessment & Plan Note (Signed)
Chronic right leg pain seems to be a combination of hip arthritis given groin pain and poor internal and external rotation along with lumbar radiculopathy radiating from lumbar spine into the buttocks and anterior thigh down anterior leg. Somewhat atypical in distribution. No red flag symptoms. History of discectomy years ago by Dr. Arnell Asal. Possible component of trochanteric bursitis though unclear with generalized pain in this area. -Lumbar and hip x-rays -Urged patient to walk with cane to avoid fall. -Naproxen 220-440 mg twice a day with food for 5 days. Can use Tylenol in between. Avoid other NSAIDs and long-term NSAIDs with heart disease hx. -Also urged to remain active, use heat or ice for relief, and work on core strengthening. Cat Cow and pelvic tilt exercises reviewed.  -Follow-up in 3-4 weeks. At that time if no improvement, can consider physical therapy or greater trochanteric injection if still tender in that area.

## 2014-10-23 NOTE — Progress Notes (Signed)
Patient ID: Lindsey Payne, female   DOB: [birth year], 64 y.o.   MRN: [id number] Subjective:   CC: Checking on leg and knot on wrist  HPI:   Chronic R leg pain Patient presents with chronic right leg pain that she believes started after falling 2-3 years ago. She states that pain begins in right lumbar region radiating into right ASIS and then down anterior leg into ankle. Intermittent, occasionally severe to the point of needing to walk with a cane. Notes similar pain in the left leg occasionally. Denies numbness or tingling down the leg. Denies severe weakness, perineal tingling, or bladder or stool incontinence. Denies redness, swelling, deformity, fevers, or chills. She frequently notes pain in her groin especially if she flexes her hip. Pain is also worse with laying on the right and when she gets out of bed. She has tried Aleve 2 tablets daily which does not help. Onset is random. Occasionally wakes her up from sleep. Walking seems to make it worse. She has a history of discectomy in her lumbar spine which she does not feel made her back pain any better. This was prior to onset of current leg pain.  Knot on R wrist Patient also reports a knot that she noticed in her right wrist about 3 weeks ago. It has possibly gotten a little bigger. It is occasionally tender if she pushes on it. She denies redness, issues with range of motion, history of trauma, or warmth. She is not very bothered by it but is wondering what it is.  Review of Systems - Per HPI.   PMH - anxiety, lumbar radiculopathy, ischemic cardiomyopathy, systolic heart failure, CAD, depressive disorder, hemorrhoids, herniated disc, hypertension, hyperlipidemia, tobacco abuse, history of tubulovillous adenoma of the colon    Objective:  Physical Exam BP 115/80 mmHg  Pulse 82  Temp(Src) 97.6 F (36.4 C) (Oral)  Wt 187 lb 6.4 oz (85.004 kg) GEN: NAD Back: No visible erythema, deformity, or swelling, healed 4 cm linear scar over  lumbar spine, No point tenderness, however there is some tenderness in right paraspinal and gluteus muscles, limited back range of motion with stiff gait EXTR: Right Hip internal and external rotation decreased compared to left and causes tenderness, mild greater trochanter tenderness but difficult to distinguish between generalized tenderness from lumbar radiating into gluteus and anterior thigh as gluteus is also tender Right hip strength 5 out of 5 on flexion, abduction, and adduction.   Right volar wrist with 1 cm mildly tender firm cyst, no erythema, no warmth, full wrist range of motion, full wrist strength    Assessment:     Lindsey Payne is a 64 y.o. female here for chronic right leg pain and new right wrist bump.    Plan:     # See problem list and after visit summary for problem-specific plans.   # Health Maintenance: Not discussed  Follow-up: Follow up in 3-4 weeks for right leg pain f/u.   Hilton Sinclair, MD [employer]

## 2014-11-15 ENCOUNTER — Ambulatory Visit (INDEPENDENT_AMBULATORY_CARE_PROVIDER_SITE_OTHER): Payer: Commercial Managed Care - HMO | Admitting: Family Medicine

## 2014-11-15 ENCOUNTER — Encounter: Payer: Self-pay | Admitting: Family Medicine

## 2014-11-15 VITALS — BP 128/64 | HR 80 | Ht 68.0 in | Wt 184.0 lb

## 2014-11-15 DIAGNOSIS — M25551 Pain in right hip: Secondary | ICD-10-CM

## 2014-11-15 DIAGNOSIS — M545 Low back pain, unspecified: Secondary | ICD-10-CM

## 2014-11-15 MED ORDER — TRAMADOL HCL 50 MG PO TABS: 25.0000 mg | ORAL_TABLET | Freq: Every day | ORAL | Status: DC | PRN

## 2014-11-15 MED ORDER — METHYLPREDNISOLONE ACETATE 80 MG/ML IJ SUSP
80.0000 mg | Freq: Once | INTRAMUSCULAR | Status: AC
  Administered 2014-11-15: 80 mg via INTRAMUSCULAR

## 2014-11-15 NOTE — Assessment & Plan Note (Signed)
Bilateral lumbar pain with right radiculopathy and some inguinal region pain making hip OA also possible. No red flag symptoms. Currently very frustrated with worsened pain with no obvious trigger. - Urged to get ordered lumbar/hip xrays. Pending results, decide on SM vs PT referral. - Depomedrol 80mg  IM in clinic and tramadol #15 PRN severe pain; discussed this is not a chronic rx. - Switch to PRN tylenol and hold off on aleve for now. - F/u 1-2 weeks.

## 2014-11-15 NOTE — Progress Notes (Signed)
Patient ID: Lindsey Payne, female   DOB: 04/23/1951, 64 y.o.   MRN: [id number] Subjective:   CC: F/u back and leg pain  HPI:   Patient presents for f/u of back and leg pain that is bilateral lumbar and right inguinum/lateral and anterior leg. Last visit, thought radiculopathy vs hip arthritis given generalized pain. States it is worsened to 10/10 and shoots down right leg when she walks. Denies numbness/tingling in left leg but has these in right. Any walking worsens pain and is making it difficult for her to function. Nothing including aleve improves pain. Occasionally wakes her from sleep. Has to sleep on left side. Did not get xray lumbar and hip yet. Denies incontinence of bowel/bladder, perineal numbness/tingling, or focal weakness. However, is very frustrated with pain.   Review of Systems - Per HPI.   PMH - anxiety, lumbar radiculopathy, ischemic cardiomyopathy, systolic heart failure, CAD, depressive disorder, hemorrhoids, herniated disc, hypertension, hyperlipidemia, tobacco abuse, history of tubulovillous adenoma of the colon     Objective:  Physical Exam BP 128/64 mmHg  Pulse 80  Ht 5\' 8"  (1.727 m)  Wt 184 lb (83.462 kg)  BMI 27.98 kg/m2 GEN: NAD LE: No back or hip deformity or swelling TTP general right hip with none specifically over greater trochanter Full right hip ROM, 4+/5 right hip flexion seemingly limited by painby pain, 5/5 left hip flexion Normal sensation to light touch bilateral legs Right paraspinal tenderness Right positive straight leg raise, left negative Antalgic gait with cane    Assessment:     Lindsey Payne is a 64 y.o. female here for f/u low back/leg pain.    Plan:     # See problem list and after visit summary for problem-specific plans. - Needs F/u of depression.    # Health Maintenance: Not discussed  Follow-up: Follow up in 1-2 weeks for f/u leg/low back pain.   Hilton Sinclair, MD [place]

## 2014-11-15 NOTE — Patient Instructions (Signed)
Please be sure to get your xrays done. When you go on Monday, if there is any issue, please have them call our office. Once we see this result, we can know how to proceed and if physical therapy or sports medicine referral is appropriate, or further imaging. You are getting a steroid shot today.  You are also getting a prescription for tramadol, to just take once daily when absolutely necessary. This is not a good long-term medication. Otherwise, take tylenol when needed and try to keep the body moving.  I will call you about the xray result.  Best,  Hilton Sinclair, MD

## 2014-11-18 ENCOUNTER — Ambulatory Visit (HOSPITAL_COMMUNITY)
Admission: RE | Admit: 2014-11-18 | Discharge: 2014-11-18 | Disposition: A | Discharge status: Home / Self Care | Payer: Commercial Managed Care - HMO | Source: Ambulatory Visit | Attending: Family Medicine | Admitting: Family Medicine

## 2014-11-18 DIAGNOSIS — I700 Atherosclerosis of aorta: Secondary | ICD-10-CM | POA: Diagnosis not present

## 2014-11-18 DIAGNOSIS — M5416 Radiculopathy, lumbar region: Secondary | ICD-10-CM | POA: Diagnosis present

## 2014-11-18 DIAGNOSIS — M4726 Other spondylosis with radiculopathy, lumbar region: Secondary | ICD-10-CM | POA: Diagnosis not present

## 2014-11-18 DIAGNOSIS — M5116 Intervertebral disc disorders with radiculopathy, lumbar region: Secondary | ICD-10-CM | POA: Diagnosis not present

## 2014-11-20 ENCOUNTER — Telehealth: Payer: Self-pay | Admitting: Family Medicine

## 2014-11-20 DIAGNOSIS — G8929 Other chronic pain: Secondary | ICD-10-CM

## 2014-11-20 NOTE — Telephone Encounter (Addendum)
Called to notify patient of xray results with lumbar and bilateral hip arthritis, no acute fracture, and no major change from previous lumbar imaging. She voiced understanding but states she is still in lots of pain. She denies perineal numbness/tingling, focal weakness, or incontinence of bowel/bladder (red flags). She is not interested in narcotic pain medicines, states tramadol did not help nor does tylenol, and states injections with SM in the past did not help so she does not want to go that route. She is also not interested in surgery again so does not want ortho referral.  - We decided to trial Pain Management referral, and I let her know that chronic pain is very tough to treat but we would give this a try, with the goal of improved function, not pain relief. - Referral placed to Pain Management. - FYI to Pain Management, pt is not interested in chronic narcotics and we have discussed that this is not appropriate tx for chronic pain due to addictive potential / side effect profile. - Red flag reasons for emergent eval reviewed. - Stay active, back stretching/strengthening, stop if painful.  Hilton Sinclair, MD

## 2014-12-17 ENCOUNTER — Telehealth: Payer: Self-pay | Admitting: Family Medicine

## 2014-12-17 NOTE — Telephone Encounter (Signed)
Will forward to MD to see if it would be ok for patient to come for another injection. Nijee Heatwole,CMA

## 2014-12-17 NOTE — Telephone Encounter (Signed)
Would like to talk to dr T about her leg and back pain Thought maybe she could come in for a shot? Can hardly walk

## 2014-12-19 NOTE — Telephone Encounter (Signed)
Called pt. She states injection did not help much the last time (IM depomedrol). She has been in the bed all week with pain. She has not received phone call about Pain Management referral but seems open to this. She states pain was so severe 3-4 days ago that she had the passive thought of suicide but no active plan or intent. She has been trying "freezone" and hot baths which are not really helping. - [facility], please look into Pain Management referral issue, as multiple modalities of pain control failing and this will hopefully provide other options/some relief or help with pain psychology. Dr Letta Pate if available. - Asked pt to f/u as scheduled and seek immediate care with red flags (bowel/bladder incontinence, perineal numbness/tingling, leg weakness). -Also discussed importance of seeking immedaite care if any SI/HI with plan or intent. She voiced understanding and stated she was no longer passively suicidal and no active SI/HI.  Hilton Sinclair, MD

## 2014-12-23 ENCOUNTER — Encounter: Payer: Self-pay | Admitting: Family Medicine

## 2014-12-23 NOTE — Telephone Encounter (Signed)
Thanks, sent her letter stating this.  Hilton Sinclair, MD

## 2014-12-23 NOTE — Telephone Encounter (Signed)
According to the referral it is under review at this time.  They will contact pt if they decide to accept her. Lindsey Payne, Lindsey Payne

## 2015-01-16 ENCOUNTER — Encounter: Payer: Self-pay | Admitting: Physical Medicine & Rehabilitation

## 2015-01-30 ENCOUNTER — Encounter: Payer: Self-pay | Admitting: Physical Medicine & Rehabilitation

## 2015-02-06 ENCOUNTER — Other Ambulatory Visit: Payer: Self-pay | Admitting: Family Medicine

## 2015-02-06 DIAGNOSIS — I10 Essential (primary) hypertension: Secondary | ICD-10-CM

## 2015-02-06 MED ORDER — LISINOPRIL 20 MG PO TABS: ORAL_TABLET | ORAL | Status: DC

## 2015-02-06 MED ORDER — METOPROLOL SUCCINATE ER 25 MG PO TB24: 25.0000 mg | ORAL_TABLET | Freq: Two times a day (BID) | ORAL | Status: DC

## 2015-02-06 NOTE — Telephone Encounter (Signed)
Pt requesting refill on lisinopril (PRINIVIL,ZESTRIL) 20 MG tablet and metoprolol faxed into [facility], [address] [facility] [address] fax number: [phone]. Thank you, Fonda Kinder, ASA

## 2015-03-10 ENCOUNTER — Encounter: Payer: Self-pay | Admitting: Physical Medicine & Rehabilitation

## 2015-03-10 ENCOUNTER — Encounter: Payer: Commercial Managed Care - HMO | Attending: Physical Medicine & Rehabilitation

## 2015-03-10 ENCOUNTER — Ambulatory Visit (HOSPITAL_BASED_OUTPATIENT_CLINIC_OR_DEPARTMENT_OTHER): Payer: Commercial Managed Care - HMO | Admitting: Physical Medicine & Rehabilitation

## 2015-03-10 ENCOUNTER — Other Ambulatory Visit: Payer: Self-pay | Admitting: Registered Nurse

## 2015-03-10 VITALS — BP 150/86 | HR 75

## 2015-03-10 DIAGNOSIS — G8929 Other chronic pain: Secondary | ICD-10-CM | POA: Insufficient documentation

## 2015-03-10 DIAGNOSIS — Z79899 Other long term (current) drug therapy: Secondary | ICD-10-CM

## 2015-03-10 DIAGNOSIS — R1031 Right lower quadrant pain: Secondary | ICD-10-CM | POA: Diagnosis present

## 2015-03-10 DIAGNOSIS — M961 Postlaminectomy syndrome, not elsewhere classified: Secondary | ICD-10-CM

## 2015-03-10 DIAGNOSIS — Z981 Arthrodesis status: Secondary | ICD-10-CM | POA: Insufficient documentation

## 2015-03-10 DIAGNOSIS — M79604 Pain in right leg: Secondary | ICD-10-CM | POA: Insufficient documentation

## 2015-03-10 DIAGNOSIS — M545 Low back pain: Secondary | ICD-10-CM | POA: Diagnosis present

## 2015-03-10 DIAGNOSIS — I429 Cardiomyopathy, unspecified: Secondary | ICD-10-CM | POA: Insufficient documentation

## 2015-03-10 DIAGNOSIS — M1611 Unilateral primary osteoarthritis, right hip: Secondary | ICD-10-CM | POA: Insufficient documentation

## 2015-03-10 DIAGNOSIS — Z5181 Encounter for therapeutic drug level monitoring: Secondary | ICD-10-CM | POA: Diagnosis not present

## 2015-03-10 DIAGNOSIS — G894 Chronic pain syndrome: Secondary | ICD-10-CM | POA: Diagnosis not present

## 2015-03-10 MED ORDER — GABAPENTIN 300 MG PO CAPS: 300.0000 mg | ORAL_CAPSULE | Freq: Three times a day (TID) | ORAL | Status: DC

## 2015-03-10 NOTE — Progress Notes (Signed)
Subjective:    Patient ID: Lindsey Payne, female    DOB: 1951-03-15, 64 y.o.   MRN: [id number]  HPI Chief complaint is low back pain, right groin pain, right buttocks pain.  64 year old female with a long history of low back pain who had spinal surgery in 1999. She was told she had a fusion. Do not have the actual records. She did not have any evidence of instrumentation on x-ray review.  Patient had foot drop prior to her spine surgery.  Surgery did not help the foot drop at all according to the patient.  Patient has also had left toe numbness ever since she had back surgery as well.  Her hip pain and groin pain have been over the last 3 years. They are worse with walking but also feels it during sitting and standing.  Has tried tramadol but only received 10 tablets from her primary care physician. Patient states that it was not helpful. Has not tried gabapentin. Patient has not had any treatment for her right hip pain and not had any surgical evaluation for this.   Pain Inventory Average Pain 10 Pain Right Now 10 My pain is aching  In the last 24 hours, has pain interfered with the following? General activity 10 Relation with others 0 Enjoyment of life 0 What TIME of day is your pain at its worst? all Sleep (in general) Good  Pain is worse with: walking, bending, sitting and standing Pain improves with: rest Relief from Meds: 0  Mobility walk with assistance use a cane how many minutes can you walk? 0 ability to climb steps?  yes do you drive?  yes  Function not employed: date last employed 1999 I need assistance with the following:  bathing, toileting, meal prep, household duties and shopping  Neuro/Psych trouble walking depression anxiety  Prior Studies Any changes since last visit?  no  Physicians involved in your care Any changes since last visit?  no   Family History  Problem Relation Age of Onset  . Breast cancer Mother   . Colon cancer  Father 68    died  . Diabetes Father   . Diabetes Brother   . Heart disease Father    Social History   Social History  . Marital Status: Widowed    Spouse Name: N/A  . Number of Children: 1  . Years of Education: N/A   Occupational History  . disabled    Social History Main Topics  . Smoking status: Current Every Day Smoker -- 0.25 packs/day    Types: Cigarettes  . Smokeless tobacco: Never Used     Comment: Tobacco info given 02/28/13  . Alcohol Use: 0.6 oz/week    1 Cans of beer per week     Comment: beer occasionally  . Drug Use: No  . Sexual Activity: Not Asked   Other Topics Concern  . None   Social History Narrative   Past Surgical History  Procedure Laterality Date  . Colon surgery  1987    benign tubulovillous adenoma  . Heart stints      Dr. Wynonia Lawman  . Tonsillectomy    . Lung surgery Left     for collasped lung  . Abdominal hysterectomy      has one ovary left  . Back surgery      3 disc removed   Past Medical History  Diagnosis Date  . Arthritis   . Asthma   . Depression   . Hyperlipidemia   .  Hypertension   . Adenoma of large intestine   . Back pain   . CHF (congestive heart failure)   . Tubular adenoma   . Hemorrhoids    BP 150/86 mmHg  Pulse 75  SpO2 100%  Opioid Risk Score:   Fall Risk Score:  `1  Depression screen PHQ 2/9  Depression screen [facility] 2/9 03/10/2015 11/15/2014 10/23/2014 08/15/2013 08/15/2013 11/17/2012 05/01/2012  Decreased Interest 3 3 0 0 0 0 0  Down, Depressed, Hopeless 0 - 0 0 0 0 0  PHQ - 2 Score 3 3 0 0 0 0 0  Altered sleeping 0 - - - - 0 -  Tired, decreased energy 3 - - - - 0 -  Change in appetite 0 - - - - 0 -  Feeling bad or failure about yourself  0 - - - - 0 -  Trouble concentrating 0 - - - - 0 -  Moving slowly or fidgety/restless 0 - - - - 0 -  Suicidal thoughts 0 - - - - 0 -  PHQ-9 Score 6 - - - - 0 -  Difficult doing work/chores Not difficult at all - - - - - -     Review of Systems  Musculoskeletal:  Positive for back pain and gait problem.  Neurological: Positive for numbness.  Psychiatric/Behavioral: Positive for dysphoric mood. The patient is nervous/anxious.   All other systems reviewed and are negative.      Objective:   Physical Exam  Constitutional: She is oriented to person, place, and time. She appears well-developed and well-nourished.  HENT:  Head: Normocephalic and atraumatic.  Eyes: Conjunctivae are normal. Pupils are equal, round, and reactive to light.  Neck: Normal range of motion. Thyromegaly present.  Musculoskeletal:       Right hip: She exhibits decreased range of motion and decreased strength.       Left hip: Normal.       Lumbar back: She exhibits decreased range of motion. She exhibits no tenderness, no swelling, no deformity and no spasm.  Neurological: She is alert and oriented to person, place, and time. She displays no atrophy. A sensory deficit is present. She exhibits normal muscle tone. Gait abnormal.  Reflex Scores:      Tricep reflexes are 2+ on the right side and 2+ on the left side.      Bicep reflexes are 2+ on the right side and 2+ on the left side.      Brachioradialis reflexes are 2+ on the right side and 2+ on the left side.      Patellar reflexes are 1+ on the right side and 1+ on the left side.      Achilles reflexes are 1+ on the right side and 1+ on the left side. Antalgic gait, some valgus at the knee, no knee buckling, no toe drag  5/5 strength bilateral deltoid, biceps, triceps, grip 5/5 and left hip flexor and extensor ankle dorsi flexor plantar flexor 4/5 in the right hip flexor, knee extensor, ankle dorsi flexors and plantar flexors  Skin: Skin is warm and dry.  Psychiatric: She has a normal mood and affect.  Nursing note and vitals reviewed.    Decreased sensation in the left L5 and left S1 dermatomal distribution     Assessment & Plan:  1. Right hip osteoarthritis. Severe limitation in range of motion as well as pain  during ambulation. X-rays demonstrate severe osteoarthritis on the right side and only mild changes on the  left side. She has contracture at the right hip. We discussed treatment options including medications, injections, physical therapy. Given the severity of her contracture, pain as well as radiographic findings I do think surgery evaluation would be an appropriate next step. She has been dealing with this for about 3 years  2. Lumbar postlaminectomy syndrome she does have some chronic axial pain. She gives a history of lumbar fusion however no evidence of instrumentation on x-ray. She may have lumbar facet arthropathy given the increased bone density seen at L4-L5 and L5-S1 areas. We'll Request some old records From Dr. Clarice Pole office  3. Radiating pain right lower extremity appears to be chronic radicular pain. She's had it since 1999. We'll trial gabapentin.  4. Cardiomyopathy, coronary artery disease, follows with Dr. Wynonia Lawman. Would likely need preoperative clearance should she undergo hip surgery  I'll see her back in about 2 months. I would like for her to get the hip evaluated first. I anticipate she'll likely need surgery. Marland Kitchen  She will need further evaluation and treatment of her back pain once the hip is addressed.

## 2015-03-10 NOTE — Patient Instructions (Signed)
You have several causes for your pain. One of the major causes is severe osteoarthritis of the right hip. I will recommend orthopedic evaluation. Have made a referral. You also have some radicular pain which is nerve root pain going down the right leg. I have written for gabapentin for this. I would like to see you back in a couple months.. I would anticipate you will need surgery for the hip.

## 2015-03-11 LAB — PMP ALCOHOL METABOLITE (ETG)

## 2015-03-15 LAB — PRESCRIPTION MONITORING PROFILE (SOLSTAS)
Amphetamine/Meth: NEGATIVE ng/mL
BUPRENORPHINE, URINE: NEGATIVE ng/mL
Barbiturate Screen, Urine: NEGATIVE ng/mL
Benzodiazepine Screen, Urine: NEGATIVE ng/mL
CANNABINOID SCRN UR: NEGATIVE ng/mL
CARISOPRODOL, URINE: NEGATIVE ng/mL
CREATININE, URINE: 70.72 mg/dL (ref >20.0)
Cocaine Metabolites: NEGATIVE ng/mL
ECSTASY: NEGATIVE ng/mL
Fentanyl, Ur: NEGATIVE ng/mL
MEPERIDINE UR: NEGATIVE ng/mL
Methadone Screen, Urine: NEGATIVE ng/mL
Nitrites, Initial: NEGATIVE ug/mL
OPIATE SCREEN, URINE: NEGATIVE ng/mL
OXYCODONE SCRN UR: NEGATIVE ng/mL
PH URINE, INITIAL: 5.1 pH (ref 4.5–8.9)
Propoxyphene: NEGATIVE ng/mL
TRAMADOL UR: NEGATIVE ng/mL
Tapentadol, urine: NEGATIVE ng/mL
ZOLPIDEM, URINE: NEGATIVE ng/mL

## 2015-03-15 LAB — ETHYL GLUCURONIDE, URINE
ETGU: 8269 ng/mL — AB (ref <500)
ETHYL SULFATE (ETS): 4841 ng/mL — AB (ref <100)

## 2015-03-27 NOTE — Progress Notes (Signed)
Urine drug screen for this encounter is positive for alcohol

## 2015-04-15 ENCOUNTER — Telehealth: Payer: Self-pay | Admitting: Family Medicine

## 2015-04-15 DIAGNOSIS — M5489 Other dorsalgia: Secondary | ICD-10-CM

## 2015-04-15 NOTE — Telephone Encounter (Signed)
Pt called and needs a referral for a past visit to see Dr. Alysia Penna. Her insurance is not paying because that doctor didn't have a referral from [place]. jw

## 2015-04-15 NOTE — Telephone Encounter (Signed)
Pt needs a referral for a past visit

## 2015-04-15 NOTE — Telephone Encounter (Signed)
Will forward to MD to place referral for pain management.  Lindsey Payne,CMA

## 2015-04-15 NOTE — Telephone Encounter (Signed)
Referral placed.  Algis Greenhouse. Jerline Pain, [place] Resident PGY-2 04/15/2015 3:15 PM

## 2015-04-23 NOTE — Telephone Encounter (Signed)
Need refill on the furosemide sent to [facility].

## 2015-04-23 NOTE — Telephone Encounter (Signed)
Refill request from pharmacy. Will forward to PCP for review. Lorrie Gargan, CMA. 

## 2015-04-24 MED ORDER — FUROSEMIDE 40 MG PO TABS: ORAL_TABLET | ORAL | Status: DC

## 2015-04-24 NOTE — Telephone Encounter (Signed)
Rx filled. Patient needs to be seen for follow up and labs.  Lindsey Payne. Jerline Pain, [place] Resident PGY-2 04/24/2015 3:37 PM

## 2015-04-24 NOTE — Telephone Encounter (Signed)
LM for patient on identified VM.  Please assist her in making an appt to follow up. Jazmin Hartsell,CMA

## 2015-04-26 ENCOUNTER — Encounter (HOSPITAL_COMMUNITY): Payer: Self-pay

## 2015-04-26 ENCOUNTER — Emergency Department (HOSPITAL_COMMUNITY)
Admission: EM | Admit: 2015-04-26 | Discharge: 2015-04-26 | Disposition: A | Discharge status: Home / Self Care | Payer: Commercial Managed Care - HMO | Attending: Emergency Medicine | Admitting: Emergency Medicine

## 2015-04-26 DIAGNOSIS — F329 Major depressive disorder, single episode, unspecified: Secondary | ICD-10-CM | POA: Insufficient documentation

## 2015-04-26 DIAGNOSIS — J45909 Unspecified asthma, uncomplicated: Secondary | ICD-10-CM | POA: Insufficient documentation

## 2015-04-26 DIAGNOSIS — Z7982 Long term (current) use of aspirin: Secondary | ICD-10-CM | POA: Diagnosis not present

## 2015-04-26 DIAGNOSIS — M1990 Unspecified osteoarthritis, unspecified site: Secondary | ICD-10-CM | POA: Insufficient documentation

## 2015-04-26 DIAGNOSIS — I509 Heart failure, unspecified: Secondary | ICD-10-CM | POA: Diagnosis not present

## 2015-04-26 DIAGNOSIS — Z720 Tobacco use: Secondary | ICD-10-CM | POA: Insufficient documentation

## 2015-04-26 DIAGNOSIS — Z79899 Other long term (current) drug therapy: Secondary | ICD-10-CM | POA: Diagnosis not present

## 2015-04-26 DIAGNOSIS — I10 Essential (primary) hypertension: Secondary | ICD-10-CM | POA: Insufficient documentation

## 2015-04-26 DIAGNOSIS — Z86018 Personal history of other benign neoplasm: Secondary | ICD-10-CM | POA: Insufficient documentation

## 2015-04-26 DIAGNOSIS — Z8719 Personal history of other diseases of the digestive system: Secondary | ICD-10-CM | POA: Insufficient documentation

## 2015-04-26 DIAGNOSIS — Z791 Long term (current) use of non-steroidal anti-inflammatories (NSAID): Secondary | ICD-10-CM | POA: Diagnosis not present

## 2015-04-26 DIAGNOSIS — H5712 Ocular pain, left eye: Secondary | ICD-10-CM | POA: Diagnosis present

## 2015-04-26 DIAGNOSIS — Z8639 Personal history of other endocrine, nutritional and metabolic disease: Secondary | ICD-10-CM | POA: Diagnosis not present

## 2015-04-26 DIAGNOSIS — H1132 Conjunctival hemorrhage, left eye: Secondary | ICD-10-CM | POA: Insufficient documentation

## 2015-04-26 MED ORDER — HYPROMELLOSE (GONIOSCOPIC) 2.5 % OP SOLN: 1.0000 [drp] | Freq: Three times a day (TID) | OPHTHALMIC | Status: DC

## 2015-04-26 MED ORDER — TETRACAINE HCL 0.5 % OP SOLN
1.0000 [drp] | Freq: Once | OPHTHALMIC | Status: DC
  Filled 2015-04-26: qty 2

## 2015-04-26 MED ORDER — FLUORESCEIN SODIUM 1 MG OP STRP
1.0000 | ORAL_STRIP | Freq: Once | OPHTHALMIC | Status: DC
  Filled 2015-04-26: qty 1

## 2015-04-26 NOTE — ED Notes (Signed)
Patient wears glasses but did not bring them with her therefore did poorly on visual acuity test

## 2015-04-26 NOTE — Discharge Instructions (Signed)
Subconjunctival Hemorrhage °Subconjunctival hemorrhage is bleeding that happens between the white part of your eye (sclera) and the clear membrane that covers the outside of your eye (conjunctiva). There are many tiny blood vessels near the surface of your eye. A subconjunctival hemorrhage happens when one or more of these vessels breaks and bleeds, causing a red patch to appear on your eye. This is similar to a bruise. °Depending on the amount of bleeding, the red patch may only cover a small area of your eye or it may cover the entire visible part of the sclera. If a lot of blood collects under the conjunctiva, there may also be swelling. Subconjunctival hemorrhages do not affect your vision or cause pain, but your eye may feel irritated if there is swelling. Subconjunctival hemorrhages usually do not require treatment, and they disappear on their own within two weeks. °CAUSES °This condition may be caused by: °· Mild trauma, such as rubbing your eye too hard. °· Severe trauma or blunt injuries. °· Coughing, sneezing, or vomiting. °· Straining, such as when lifting a heavy object. °· High blood pressure. °· Recent eye surgery. °· A history of diabetes. °· Certain medicines, especially blood thinners (anticoagulants). °· Other conditions, such as eye tumors, bleeding disorders, or blood vessel abnormalities. °Subconjunctival hemorrhages can happen without an obvious cause.  °SYMPTOMS  °Symptoms of this condition include: °· A bright red or dark red patch on the white part of the eye. °¨ The red area may spread out to cover a larger area of the eye before it goes away. °¨ The red area may turn brownish-yellow before it goes away. °· Swelling. °· Mild eye irritation. °DIAGNOSIS °This condition is diagnosed with a physical exam. If your subconjunctival hemorrhage was caused by trauma, your health care provider may refer you to an eye specialist (ophthalmologist) or another specialist to check for other injuries. You  may have other tests, including: °· An eye exam. °· A blood pressure check. °· Blood tests to check for bleeding disorders. °If your subconjunctival hemorrhage was caused by trauma, X-rays or a CT scan may be done to check for other injuries. °TREATMENT °Usually, no treatment is needed. Your health care provider may recommend eye drops or cold compresses to help with discomfort. °HOME CARE INSTRUCTIONS °· Take over-the-counter and prescription medicines only as directed by your health care provider. °· Use eye drops or cold compresses to help with discomfort as directed by your health care provider. °· Avoid activities, things, and environments that may irritate or injure your eye. °· Keep all follow-up visits as told by your health care provider. This is important. °SEEK MEDICAL CARE IF: °· You have pain in your eye. °· The bleeding does not go away within 3 weeks. °· You keep getting new subconjunctival hemorrhages. °SEEK IMMEDIATE MEDICAL CARE IF: °· Your vision changes or you have difficulty seeing. °· You suddenly develop severe sensitivity to light. °· You develop a severe headache, persistent vomiting, confusion, or abnormal tiredness (lethargy). °· Your eye seems to bulge or protrude from your eye socket. °· You develop unexplained bruises on your body. °· You have unexplained bleeding in another area of your body. °  °This information is not intended to replace advice given to you by your health care provider. Make sure you discuss any questions you have with your health care provider. °  °Document Released: 06/28/2005 Document Revised: 03/19/2015 Document Reviewed: 09/04/2014 °Elsevier Interactive Patient Education ©2016 [employer] ° °

## 2015-04-26 NOTE — ED Notes (Signed)
Lt. Eye sclera is red, no blurred vision or drainage or pain

## 2015-04-26 NOTE — ED Provider Notes (Signed)
CSN: [id number]     Arrival date & time 04/26/15  1419 History   First MD Initiated Contact with Patient 04/26/15 1424     Chief Complaint  Patient presents with  . Eye Problem     (Consider location/radiation/quality/duration/timing/severity/associated sxs/prior Treatment) HPI Comments: Patient presents to the emergency department with chief complaint of left eye redness. She states that when she awoke she noticed that the white of her eye was red this morning. She denies any pain. Denies any burning, scratching, or itching sensation. Denies any discharge. She denies any fevers or chills. Denies any vision changes. Denies any pain with movement. Denies any foreign body or injury. She takes a baby aspirin daily. There are no aggravating or alleviating factors.  The history is provided by the patient. No language interpreter was used.    Past Medical History  Diagnosis Date  . Arthritis   . Asthma   . Depression   . Hyperlipidemia   . Hypertension   . Adenoma of large intestine   . Back pain   . CHF (congestive heart failure) ([place])   . Tubular adenoma   . Hemorrhoids    Past Surgical History  Procedure Laterality Date  . Colon surgery  1987    benign tubulovillous adenoma  . Heart stints      Dr. Wynonia Lawman  . Tonsillectomy    . Lung surgery Left     for collasped lung  . Abdominal hysterectomy      has one ovary left  . Back surgery      3 disc removed   Family History  Problem Relation Age of Onset  . Breast cancer Mother   . Colon cancer Father 75    died  . Diabetes Father   . Diabetes Brother   . Heart disease Father    Social History  Substance Use Topics  . Smoking status: Current Every Day Smoker -- 0.25 packs/day    Types: Cigarettes  . Smokeless tobacco: Never Used     Comment: Tobacco info given 02/28/13  . Alcohol Use: 0.6 oz/week    1 Cans of beer per week     Comment: beer occasionally   OB History    No data available     Review of Systems   Constitutional: Negative for fever and chills.  HENT:       Left eye redness  Respiratory: Negative for shortness of breath.   Cardiovascular: Negative for chest pain.  Gastrointestinal: Negative for nausea, vomiting, diarrhea and constipation.  Genitourinary: Negative for dysuria.      Allergies  Valium  Home Medications   Prior to Admission medications   Medication Sig Start Date End Date Taking? Authorizing Provider  aspirin 81 MG EC tablet TAKE 1 TABLET BY MOUTH EVERY DAY    Hilton Sinclair, MD  furosemide (LASIX) 40 MG tablet TAKE 1/2 TABLET TWICE A DAY 04/24/15   Vivi Barrack, MD  gabapentin (NEURONTIN) 300 MG capsule Take 1 capsule (300 mg total) by mouth 3 (three) times daily. 03/10/15   Charlett Blake, MD  hydrALAZINE (APRESOLINE) 50 MG tablet 50 mg 2 (two) times daily.  04/23/12   Historical Provider, MD  ibuprofen (ADVIL,MOTRIN) 800 MG tablet Take 1 tablet (800 mg total) by mouth 3 (three) times daily. 03/22/14   Tatyana Kirichenko, PA-C  isosorbide dinitrate (ISORDIL) 20 MG tablet TAKE 1 TABLET BY MOUTH TWICE A DAY 12/31/13   Hilton Sinclair, MD  lisinopril (PRINIVIL,ZESTRIL) 20  MG tablet TAKE 1 TABLET (20 MG TOTAL) BY MOUTH DAILY. 02/06/15   Vivi Barrack, MD  metoprolol succinate (TOPROL-XL) 25 MG 24 hr tablet Take 1 tablet (25 mg total) by mouth 2 (two) times daily. 02/06/15   Vivi Barrack, MD  PARoxetine (PAXIL) 40 MG tablet TAKE 1 TABLET (40 MG TOTAL) BY MOUTH EVERY MORNING. 05/06/14   Hilton Sinclair, MD  potassium chloride SA (KLOR-CON M20) 20 MEQ tablet Take 1 tablet (20 mEq total) by mouth 2 (two) times daily. 06/18/14   Hilton Sinclair, MD  spironolactone (ALDACTONE) 25 MG tablet TAKE 1 TABLET (25 MG TOTAL) BY MOUTH DAILY. 06/24/14   Hilton Sinclair, MD  traMADol (ULTRAM) 50 MG tablet Take 0.5 tablets (25 mg total) by mouth daily as needed. For 1 week when pain severe 11/15/14   Hilton Sinclair, MD   BP 171/90 mmHg  Pulse 73   Temp(Src) 98 F (36.7 C) (Oral)  Resp 20  SpO2 100% Physical Exam  Constitutional: She is oriented to person, place, and time. She appears well-developed and well-nourished.  HENT:  Head: Normocephalic and atraumatic.  Eyes: Conjunctivae and EOM are normal.  Left subconjunctival hemorrhage, normal slit-lamp exam, no fluorescein uptake, normal limited bedside funduscopic exam (patient not dilated), no foreign body, normal extraocular motion, no evidence of entrapment   Visual Acuity  Right Eye Distance: 50 Left Eye Distance: 50 Bilateral Distance: 50  Right Eye Near: R Near: 20 Left Eye Near:  L Near: 20 Bilateral Near:  20   Neck: Normal range of motion.  Cardiovascular: Normal rate.   Pulmonary/Chest: Effort normal.  Abdominal: She exhibits no distension.  Musculoskeletal: Normal range of motion.  Neurological: She is alert and oriented to person, place, and time.  Skin: Skin is dry.  Psychiatric: She has a normal mood and affect. Her behavior is normal. Judgment and thought content normal.  Nursing note and vitals reviewed.   ED Course  Procedures (including critical care time)   MDM   Final diagnoses:  Subconjunctival hemorrhage of left eye    Patient was subconjunctival hemorrhage. Thought to be spontaneous. No mechanism of injury. Normal slit-lamp and fluorescein exam. Will discharge to home. Recommend ophthalmology follow-up in 2 days. Patient discussed with Dr. Roderic Palau, who agrees with the plan.    Montine Circle, PA-C 04/26/15 1457  Milton Ferguson, MD 04/27/15 1228

## 2015-05-02 ENCOUNTER — Other Ambulatory Visit (HOSPITAL_COMMUNITY): Payer: Self-pay | Admitting: Orthopaedic Surgery

## 2015-05-07 ENCOUNTER — Encounter (HOSPITAL_COMMUNITY): Payer: Self-pay

## 2015-05-07 ENCOUNTER — Encounter (HOSPITAL_COMMUNITY)
Admission: RE | Admit: 2015-05-07 | Discharge: 2015-05-07 | Disposition: A | Discharge status: Home / Self Care | Payer: Commercial Managed Care - HMO | Source: Ambulatory Visit | Attending: Orthopaedic Surgery | Admitting: Orthopaedic Surgery

## 2015-05-07 DIAGNOSIS — I2510 Atherosclerotic heart disease of native coronary artery without angina pectoris: Secondary | ICD-10-CM | POA: Diagnosis not present

## 2015-05-07 DIAGNOSIS — E785 Hyperlipidemia, unspecified: Secondary | ICD-10-CM | POA: Diagnosis not present

## 2015-05-07 DIAGNOSIS — I10 Essential (primary) hypertension: Secondary | ICD-10-CM | POA: Diagnosis not present

## 2015-05-07 DIAGNOSIS — Z01818 Encounter for other preprocedural examination: Secondary | ICD-10-CM | POA: Insufficient documentation

## 2015-05-07 DIAGNOSIS — Z7982 Long term (current) use of aspirin: Secondary | ICD-10-CM | POA: Insufficient documentation

## 2015-05-07 DIAGNOSIS — M1611 Unilateral primary osteoarthritis, right hip: Secondary | ICD-10-CM | POA: Insufficient documentation

## 2015-05-07 DIAGNOSIS — J45909 Unspecified asthma, uncomplicated: Secondary | ICD-10-CM | POA: Insufficient documentation

## 2015-05-07 DIAGNOSIS — Z01812 Encounter for preprocedural laboratory examination: Secondary | ICD-10-CM | POA: Diagnosis not present

## 2015-05-07 DIAGNOSIS — I509 Heart failure, unspecified: Secondary | ICD-10-CM | POA: Diagnosis not present

## 2015-05-07 DIAGNOSIS — F17200 Nicotine dependence, unspecified, uncomplicated: Secondary | ICD-10-CM | POA: Insufficient documentation

## 2015-05-07 DIAGNOSIS — Z79899 Other long term (current) drug therapy: Secondary | ICD-10-CM | POA: Diagnosis not present

## 2015-05-07 HISTORY — DX: Other pulmonary collapse: J98.19

## 2015-05-07 HISTORY — DX: Atherosclerotic heart disease of native coronary artery without angina pectoris: I25.10

## 2015-05-07 LAB — CBC
HCT: 39 % (ref 36.0–46.0)
Hemoglobin: 13 g/dL (ref 12.0–15.0)
MCH: 28.4 pg (ref 26.0–34.0)
MCHC: 33.3 g/dL (ref 30.0–36.0)
MCV: 85.2 fL (ref 78.0–100.0)
PLATELETS: 281 10*3/uL (ref 150–400)
RBC: 4.58 MIL/uL (ref 3.87–5.11)
RDW: 13.4 % (ref 11.5–15.5)
WBC: 5.6 10*3/uL (ref 4.0–10.5)

## 2015-05-07 LAB — BASIC METABOLIC PANEL
Anion gap: 9 (ref 5–15)
BUN: 14 mg/dL (ref 6–20)
CALCIUM: 9.7 mg/dL (ref 8.9–10.3)
CO2: 24 mmol/L (ref 22–32)
CREATININE: 1.2 mg/dL — AB (ref 0.44–1.00)
Chloride: 105 mmol/L (ref 101–111)
GFR calc Af Amer: 54 mL/min — ABNORMAL LOW (ref >60)
GFR, EST NON AFRICAN AMERICAN: 46 mL/min — AB (ref >60)
GLUCOSE: 99 mg/dL (ref 65–99)
POTASSIUM: 4 mmol/L (ref 3.5–5.1)
SODIUM: 138 mmol/L (ref 135–145)

## 2015-05-07 LAB — SURGICAL PCR SCREEN
MRSA, PCR: NEGATIVE
STAPHYLOCOCCUS AUREUS: NEGATIVE

## 2015-05-07 NOTE — Pre-Procedure Instructions (Signed)
    CHELISE HANGER  05/07/2015      [id number] [place], [place] [address] [place] [address] Phone: [phone] Fax: [phone]  [facility] [place] [address] Phone: [phone] Fax: [phone]    Your procedure is scheduled on Tuesday, May 13, 2015  Report to [facility] Admitting at 10:45 A.M.  Call this number if you have problems the morning of surgery:  [phone]   Remember:  Do not eat food or drink liquids after midnight Monday, May 12, 2015  Take these medicines the morning of surgery with A SIP OF WATER : hydrALAZINE (APRESOLINE) isosorbide dinitrate (ISORDIL), metoprolol succinate (TOPROL-XL), PARoxetine (PAXIL), if needed:acetaminophen-codeine (TYLENOL #3) for pain  Stop taking Aspirin,vitamins and herbal medications. Do not take any NSAIDs ie: Ibuprofen, Advil, Naproxen or any medication containing Aspirin; stop now.  Do not wear jewelry, make-up or nail polish.  Do not wear lotions, powders, or perfumes.  You may not wear deodorant.  Do not shave 48 hours prior to surgery.   Do not bring valuables to the hospital.  [facility] is not responsible for any belongings or valuables.  Contacts, dentures or bridgework may not be worn into surgery.  Leave your suitcase in the car.  After surgery it may be brought to your room.  For patients admitted to the hospital, discharge time will be determined by your treatment team.  Patients discharged the day of surgery will not be allowed to drive home.   Name and phone number of your driver:    Special instructions: Shower the night before surgery and the morning of surgery with CHG.  Please read over the following fact sheets that you were given. Pain Booklet, Coughing and Deep Breathing, Total Joint Packet, MRSA Information and  Surgical Site Infection Prevention

## 2015-05-08 NOTE — Progress Notes (Signed)
Anesthesia Chart Review:  Pt is 64 year old female scheduled for R total hip arthroplasty anterior approach on 05/13/2015 with Dr. Kathrynn Speed.   Cardiologist is Dr. Wynonia Lawman, last office visit 09/02/14. PCP is Dr. Dimas Chyle at the [facility].   PMH includes: CAD, CHF, HTN, hyperlipidemia, asthma. Current smoker. BMI 29.  Medications include: ASA, lasix, hydralazine, isordil, lisinopril, metoprolol, potassium, spironolactone  Preoperative labs reviewed.    EKG 09/02/2014: sinus rhythm. Nonspecific ST and T wave changes  Echo 07/16/2009:  - Moderate concentric LVH with mild reduction in LV systolic function. EF 46% - Mild LAE - Mild MR  Cardiac cath 08/26/2005:  1. Calcified proximal LAD with what appears to be mild proximal stenosis involving the left anterior descending. 2. 40-50% stenosis ostial RCA 3. Concentric LVH with some interval improvement in EF since last study.  If no changes, I anticipate pt can proceed with surgery as scheduled.   Willeen Cass, FNP-BC [facility]Anesthesiology Phone: [phone] 05/08/2015 12:50 PM

## 2015-05-09 ENCOUNTER — Other Ambulatory Visit (HOSPITAL_COMMUNITY): Payer: Commercial Managed Care - HMO

## 2015-05-12 ENCOUNTER — Encounter: Payer: Commercial Managed Care - HMO | Attending: Physical Medicine & Rehabilitation

## 2015-05-12 ENCOUNTER — Ambulatory Visit: Payer: Commercial Managed Care - HMO | Admitting: Physical Medicine & Rehabilitation

## 2015-05-12 DIAGNOSIS — G8929 Other chronic pain: Secondary | ICD-10-CM | POA: Insufficient documentation

## 2015-05-12 DIAGNOSIS — I429 Cardiomyopathy, unspecified: Secondary | ICD-10-CM | POA: Insufficient documentation

## 2015-05-12 DIAGNOSIS — M79604 Pain in right leg: Secondary | ICD-10-CM | POA: Insufficient documentation

## 2015-05-12 DIAGNOSIS — M961 Postlaminectomy syndrome, not elsewhere classified: Secondary | ICD-10-CM | POA: Insufficient documentation

## 2015-05-12 DIAGNOSIS — M1611 Unilateral primary osteoarthritis, right hip: Secondary | ICD-10-CM | POA: Insufficient documentation

## 2015-05-12 DIAGNOSIS — Z981 Arthrodesis status: Secondary | ICD-10-CM | POA: Insufficient documentation

## 2015-05-12 MED ORDER — CEFAZOLIN SODIUM-DEXTROSE 2-3 GM-% IV SOLR
2.0000 g | INTRAVENOUS | Status: AC
  Administered 2015-05-13: 2 g via INTRAVENOUS
  Filled 2015-05-12: qty 50

## 2015-05-13 ENCOUNTER — Inpatient Hospital Stay (HOSPITAL_COMMUNITY): Payer: Commercial Managed Care - HMO

## 2015-05-13 ENCOUNTER — Inpatient Hospital Stay (HOSPITAL_COMMUNITY)
Admission: AD | Admit: 2015-05-13 | Discharge: 2015-05-16 | DRG: 470 | Disposition: A | Discharge status: Home Health | Payer: Commercial Managed Care - HMO | Source: Ambulatory Visit | Attending: Orthopaedic Surgery | Admitting: Orthopaedic Surgery

## 2015-05-13 ENCOUNTER — Encounter (HOSPITAL_COMMUNITY)
Admission: AD | Disposition: A | Discharge status: Home Health | Payer: Self-pay | Source: Ambulatory Visit | Attending: Orthopaedic Surgery

## 2015-05-13 ENCOUNTER — Inpatient Hospital Stay (HOSPITAL_COMMUNITY): Payer: Commercial Managed Care - HMO | Admitting: Emergency Medicine

## 2015-05-13 ENCOUNTER — Encounter (HOSPITAL_COMMUNITY): Payer: Self-pay | Admitting: *Deleted

## 2015-05-13 ENCOUNTER — Inpatient Hospital Stay (HOSPITAL_COMMUNITY): Payer: Commercial Managed Care - HMO | Admitting: Anesthesiology

## 2015-05-13 DIAGNOSIS — M25851 Other specified joint disorders, right hip: Secondary | ICD-10-CM | POA: Diagnosis present

## 2015-05-13 DIAGNOSIS — M25751 Osteophyte, right hip: Secondary | ICD-10-CM | POA: Diagnosis present

## 2015-05-13 DIAGNOSIS — I5022 Chronic systolic (congestive) heart failure: Secondary | ICD-10-CM | POA: Diagnosis present

## 2015-05-13 DIAGNOSIS — I255 Ischemic cardiomyopathy: Secondary | ICD-10-CM | POA: Diagnosis present

## 2015-05-13 DIAGNOSIS — Z7982 Long term (current) use of aspirin: Secondary | ICD-10-CM

## 2015-05-13 DIAGNOSIS — N183 Chronic kidney disease, stage 3 (moderate): Secondary | ICD-10-CM | POA: Diagnosis present

## 2015-05-13 DIAGNOSIS — E785 Hyperlipidemia, unspecified: Secondary | ICD-10-CM | POA: Diagnosis present

## 2015-05-13 DIAGNOSIS — I130 Hypertensive heart and chronic kidney disease with heart failure and stage 1 through stage 4 chronic kidney disease, or unspecified chronic kidney disease: Secondary | ICD-10-CM | POA: Diagnosis present

## 2015-05-13 DIAGNOSIS — I9589 Other hypotension: Secondary | ICD-10-CM | POA: Diagnosis not present

## 2015-05-13 DIAGNOSIS — Z419 Encounter for procedure for purposes other than remedying health state, unspecified: Secondary | ICD-10-CM

## 2015-05-13 DIAGNOSIS — F17210 Nicotine dependence, cigarettes, uncomplicated: Secondary | ICD-10-CM | POA: Diagnosis present

## 2015-05-13 DIAGNOSIS — F329 Major depressive disorder, single episode, unspecified: Secondary | ICD-10-CM | POA: Diagnosis present

## 2015-05-13 DIAGNOSIS — M1611 Unilateral primary osteoarthritis, right hip: Secondary | ICD-10-CM | POA: Diagnosis present

## 2015-05-13 DIAGNOSIS — Z96641 Presence of right artificial hip joint: Secondary | ICD-10-CM

## 2015-05-13 DIAGNOSIS — G8929 Other chronic pain: Secondary | ICD-10-CM | POA: Diagnosis present

## 2015-05-13 DIAGNOSIS — Z23 Encounter for immunization: Secondary | ICD-10-CM | POA: Diagnosis not present

## 2015-05-13 DIAGNOSIS — I959 Hypotension, unspecified: Secondary | ICD-10-CM | POA: Diagnosis not present

## 2015-05-13 DIAGNOSIS — Z96649 Presence of unspecified artificial hip joint: Secondary | ICD-10-CM | POA: Diagnosis not present

## 2015-05-13 DIAGNOSIS — D62 Acute posthemorrhagic anemia: Secondary | ICD-10-CM | POA: Diagnosis not present

## 2015-05-13 DIAGNOSIS — I2510 Atherosclerotic heart disease of native coronary artery without angina pectoris: Secondary | ICD-10-CM | POA: Diagnosis present

## 2015-05-13 HISTORY — PX: TOTAL HIP ARTHROPLASTY: SHX124

## 2015-05-13 LAB — CBC
HCT: 31.6 % — ABNORMAL LOW (ref 36.0–46.0)
HEMOGLOBIN: 10.5 g/dL — AB (ref 12.0–15.0)
MCH: 28.7 pg (ref 26.0–34.0)
MCHC: 33.2 g/dL (ref 30.0–36.0)
MCV: 86.3 fL (ref 78.0–100.0)
PLATELETS: 202 10*3/uL (ref 150–400)
RBC: 3.66 MIL/uL — AB (ref 3.87–5.11)
RDW: 13.6 % (ref 11.5–15.5)
WBC: 10.4 10*3/uL (ref 4.0–10.5)

## 2015-05-13 LAB — BASIC METABOLIC PANEL
ANION GAP: 8 (ref 5–15)
BUN: 17 mg/dL (ref 6–20)
CHLORIDE: 104 mmol/L (ref 101–111)
CO2: 23 mmol/L (ref 22–32)
Calcium: 8.5 mg/dL — ABNORMAL LOW (ref 8.9–10.3)
Creatinine, Ser: 1.79 mg/dL — ABNORMAL HIGH (ref 0.44–1.00)
GFR calc Af Amer: 33 mL/min — ABNORMAL LOW (ref >60)
GFR, EST NON AFRICAN AMERICAN: 29 mL/min — AB (ref >60)
Glucose, Bld: 122 mg/dL — ABNORMAL HIGH (ref 65–99)
POTASSIUM: 3.9 mmol/L (ref 3.5–5.1)
SODIUM: 135 mmol/L (ref 135–145)

## 2015-05-13 SURGERY — ARTHROPLASTY, HIP, TOTAL, ANTERIOR APPROACH
Anesthesia: Monitor Anesthesia Care | Site: Hip | Laterality: Right

## 2015-05-13 MED ORDER — LACTATED RINGERS IV SOLN
Freq: Once | INTRAVENOUS | Status: AC
  Administered 2015-05-13: 11:00:00 via INTRAVENOUS

## 2015-05-13 MED ORDER — OXYCODONE HCL 5 MG PO TABS
ORAL_TABLET | ORAL | Status: AC
  Administered 2015-05-14: 10 mg via ORAL
  Filled 2015-05-13: qty 2

## 2015-05-13 MED ORDER — PROPOFOL 500 MG/50ML IV EMUL
INTRAVENOUS | Status: DC | PRN
  Administered 2015-05-13: 25 ug/kg/min via INTRAVENOUS

## 2015-05-13 MED ORDER — HYDROMORPHONE HCL 1 MG/ML IJ SOLN: 1.0000 mg | INTRAMUSCULAR | Status: DC | PRN

## 2015-05-13 MED ORDER — KETOROLAC TROMETHAMINE 15 MG/ML IJ SOLN
7.5000 mg | Freq: Four times a day (QID) | INTRAMUSCULAR | Status: AC
  Administered 2015-05-13 – 2015-05-14 (×4): 7.5 mg via INTRAVENOUS
  Filled 2015-05-13 (×2): qty 1

## 2015-05-13 MED ORDER — ACETAMINOPHEN 650 MG RE SUPP: 650.0000 mg | Freq: Four times a day (QID) | RECTAL | Status: DC | PRN

## 2015-05-13 MED ORDER — MIDAZOLAM HCL 5 MG/5ML IJ SOLN
INTRAMUSCULAR | Status: DC | PRN
  Administered 2015-05-13: 2 mg via INTRAVENOUS

## 2015-05-13 MED ORDER — PHENOL 1.4 % MT LIQD: 1.0000 | OROMUCOSAL | Status: DC | PRN

## 2015-05-13 MED ORDER — METOPROLOL SUCCINATE ER 25 MG PO TB24
25.0000 mg | ORAL_TABLET | Freq: Two times a day (BID) | ORAL | Status: DC
Start: 2015-05-13 — End: 2015-05-14

## 2015-05-13 MED ORDER — DEXTROSE 5 % IV SOLN
500.0000 mg | Freq: Four times a day (QID) | INTRAVENOUS | Status: DC | PRN
  Filled 2015-05-13: qty 5

## 2015-05-13 MED ORDER — PAROXETINE HCL 20 MG PO TABS
40.0000 mg | ORAL_TABLET | Freq: Every day | ORAL | Status: DC
  Administered 2015-05-14 – 2015-05-16 (×3): 40 mg via ORAL
  Filled 2015-05-13 (×4): qty 2

## 2015-05-13 MED ORDER — METHOCARBAMOL 500 MG PO TABS
500.0000 mg | ORAL_TABLET | Freq: Four times a day (QID) | ORAL | Status: DC | PRN
  Administered 2015-05-13 – 2015-05-14 (×3): 500 mg via ORAL
  Filled 2015-05-13 (×3): qty 1

## 2015-05-13 MED ORDER — SPIRONOLACTONE 25 MG PO TABS: 25.0000 mg | ORAL_TABLET | Freq: Every day | ORAL | Status: DC

## 2015-05-13 MED ORDER — FENTANYL CITRATE (PF) 250 MCG/5ML IJ SOLN
INTRAMUSCULAR | Status: AC
  Filled 2015-05-13: qty 5

## 2015-05-13 MED ORDER — HYDROMORPHONE HCL 1 MG/ML IJ SOLN
INTRAMUSCULAR | Status: AC
  Filled 2015-05-13: qty 1

## 2015-05-13 MED ORDER — DOCUSATE SODIUM 100 MG PO CAPS
100.0000 mg | ORAL_CAPSULE | Freq: Two times a day (BID) | ORAL | Status: DC
  Administered 2015-05-13 – 2015-05-16 (×6): 100 mg via ORAL
  Filled 2015-05-13 (×6): qty 1

## 2015-05-13 MED ORDER — 0.9 % SODIUM CHLORIDE (POUR BTL) OPTIME
TOPICAL | Status: DC | PRN
  Administered 2015-05-13: 1000 mL

## 2015-05-13 MED ORDER — FUROSEMIDE 20 MG PO TABS: 20.0000 mg | ORAL_TABLET | Freq: Two times a day (BID) | ORAL | Status: DC

## 2015-05-13 MED ORDER — ONDANSETRON HCL 4 MG PO TABS: 4.0000 mg | ORAL_TABLET | Freq: Four times a day (QID) | ORAL | Status: DC | PRN

## 2015-05-13 MED ORDER — ACETAMINOPHEN 325 MG PO TABS
650.0000 mg | ORAL_TABLET | Freq: Four times a day (QID) | ORAL | Status: DC | PRN
  Administered 2015-05-13: 650 mg via ORAL
  Filled 2015-05-13: qty 2

## 2015-05-13 MED ORDER — LACTATED RINGERS IV SOLN
INTRAVENOUS | Status: DC | PRN
  Administered 2015-05-13 (×2): via INTRAVENOUS

## 2015-05-13 MED ORDER — METOCLOPRAMIDE HCL 5 MG/ML IJ SOLN: 5.0000 mg | Freq: Three times a day (TID) | INTRAMUSCULAR | Status: DC | PRN

## 2015-05-13 MED ORDER — ONDANSETRON HCL 4 MG/2ML IJ SOLN: 4.0000 mg | Freq: Four times a day (QID) | INTRAMUSCULAR | Status: DC | PRN

## 2015-05-13 MED ORDER — POTASSIUM CHLORIDE CRYS ER 20 MEQ PO TBCR
20.0000 meq | EXTENDED_RELEASE_TABLET | Freq: Two times a day (BID) | ORAL | Status: DC
  Administered 2015-05-13: 20 meq via ORAL
  Filled 2015-05-13: qty 1

## 2015-05-13 MED ORDER — ONDANSETRON HCL 4 MG/2ML IJ SOLN
INTRAMUSCULAR | Status: DC | PRN
  Administered 2015-05-13: 4 mg via INTRAVENOUS

## 2015-05-13 MED ORDER — HYDROMORPHONE HCL 1 MG/ML IJ SOLN
0.2500 mg | INTRAMUSCULAR | Status: DC | PRN
  Administered 2015-05-13 (×4): 0.5 mg via INTRAVENOUS

## 2015-05-13 MED ORDER — POLYETHYLENE GLYCOL 3350 17 G PO PACK
17.0000 g | PACK | Freq: Every day | ORAL | Status: DC | PRN
  Administered 2015-05-15: 17 g via ORAL
  Filled 2015-05-13: qty 1

## 2015-05-13 MED ORDER — ASPIRIN EC 325 MG PO TBEC
325.0000 mg | DELAYED_RELEASE_TABLET | Freq: Two times a day (BID) | ORAL | Status: DC
  Administered 2015-05-14 – 2015-05-16 (×5): 325 mg via ORAL
  Filled 2015-05-13 (×5): qty 1

## 2015-05-13 MED ORDER — MIDAZOLAM HCL 2 MG/2ML IJ SOLN
INTRAMUSCULAR | Status: AC
  Filled 2015-05-13: qty 4

## 2015-05-13 MED ORDER — SODIUM CHLORIDE 0.9 % IV SOLN
INTRAVENOUS | Status: DC
  Administered 2015-05-13 – 2015-05-14 (×3): via INTRAVENOUS

## 2015-05-13 MED ORDER — PHENYLEPHRINE HCL 10 MG/ML IJ SOLN
10.0000 mg | INTRAVENOUS | Status: DC | PRN
  Administered 2015-05-13: 20 ug/min via INTRAVENOUS

## 2015-05-13 MED ORDER — ISOSORBIDE DINITRATE 20 MG PO TABS
20.0000 mg | ORAL_TABLET | Freq: Two times a day (BID) | ORAL | Status: DC
Start: 2015-05-13 — End: 2015-05-14
  Filled 2015-05-13 (×3): qty 1

## 2015-05-13 MED ORDER — GLYCOPYRROLATE 0.2 MG/ML IJ SOLN
INTRAMUSCULAR | Status: DC | PRN
  Administered 2015-05-13: 0.2 mg via INTRAVENOUS

## 2015-05-13 MED ORDER — METHOCARBAMOL 500 MG PO TABS
ORAL_TABLET | ORAL | Status: AC
Start: 2015-05-13 — End: 2015-05-14
  Filled 2015-05-13: qty 1

## 2015-05-13 MED ORDER — ZOLPIDEM TARTRATE 5 MG PO TABS: 5.0000 mg | ORAL_TABLET | Freq: Every evening | ORAL | Status: DC | PRN

## 2015-05-13 MED ORDER — MENTHOL 3 MG MT LOZG: 1.0000 | LOZENGE | OROMUCOSAL | Status: DC | PRN

## 2015-05-13 MED ORDER — OXYCODONE HCL 5 MG PO TABS
5.0000 mg | ORAL_TABLET | ORAL | Status: DC | PRN
  Administered 2015-05-13 – 2015-05-16 (×9): 10 mg via ORAL
  Filled 2015-05-13 (×9): qty 2

## 2015-05-13 MED ORDER — DIPHENHYDRAMINE HCL 12.5 MG/5ML PO ELIX: 12.5000 mg | ORAL_SOLUTION | ORAL | Status: DC | PRN

## 2015-05-13 MED ORDER — METOCLOPRAMIDE HCL 5 MG PO TABS: 5.0000 mg | ORAL_TABLET | Freq: Three times a day (TID) | ORAL | Status: DC | PRN

## 2015-05-13 MED ORDER — ALUM & MAG HYDROXIDE-SIMETH 200-200-20 MG/5ML PO SUSP: 30.0000 mL | ORAL | Status: DC | PRN

## 2015-05-13 MED ORDER — BUPIVACAINE IN DEXTROSE 0.75-8.25 % IT SOLN
INTRATHECAL | Status: DC | PRN
  Administered 2015-05-13: 15 mg via INTRATHECAL

## 2015-05-13 MED ORDER — HYDRALAZINE HCL 50 MG PO TABS
50.0000 mg | ORAL_TABLET | Freq: Two times a day (BID) | ORAL | Status: DC
Start: 2015-05-13 — End: 2015-05-13

## 2015-05-13 MED ORDER — KETOROLAC TROMETHAMINE 15 MG/ML IJ SOLN
INTRAMUSCULAR | Status: AC
  Administered 2015-05-14: 7.5 mg via INTRAVENOUS
  Filled 2015-05-13: qty 1

## 2015-05-13 SURGICAL SUPPLY — 56 items
APL SKNCLS STERI-STRIP NONHPOA (GAUZE/BANDAGES/DRESSINGS) ×1
BENZOIN TINCTURE PRP APPL 2/3 (GAUZE/BANDAGES/DRESSINGS) ×3 IMPLANT
BLADE SAW SGTL 18X1.27X75 (BLADE) ×2 IMPLANT
BLADE SAW SGTL 18X1.27X75MM (BLADE) ×1
BLADE SURG ROTATE 9660 (MISCELLANEOUS) IMPLANT
CAPT HIP TOTAL 2 ×2 IMPLANT
CELLS DAT CNTRL 66122 CELL SVR (MISCELLANEOUS) ×1 IMPLANT
CLOSURE STERI-STRIP 1/2X4 (GAUZE/BANDAGES/DRESSINGS) ×1
CLOSURE WOUND 1/2 X4 (GAUZE/BANDAGES/DRESSINGS) ×2
CLSR STERI-STRIP ANTIMIC 1/2X4 (GAUZE/BANDAGES/DRESSINGS) ×1 IMPLANT
COVER SURGICAL LIGHT HANDLE (MISCELLANEOUS) ×3 IMPLANT
DRAPE C-ARM 42X72 X-RAY (DRAPES) ×3 IMPLANT
DRAPE STERI IOBAN 125X83 (DRAPES) ×3 IMPLANT
DRAPE U-SHAPE 47X51 STRL (DRAPES) ×9 IMPLANT
DRSG AQUACEL AG ADV 3.5X10 (GAUZE/BANDAGES/DRESSINGS) ×3 IMPLANT
DURAPREP 26ML APPLICATOR (WOUND CARE) ×3 IMPLANT
ELECT BLADE 4.0 EZ CLEAN MEGAD (MISCELLANEOUS) ×3
ELECT BLADE 6.5 EXT (BLADE) IMPLANT
ELECT REM PT RETURN 9FT ADLT (ELECTROSURGICAL) ×3
ELECTRODE BLDE 4.0 EZ CLN MEGD (MISCELLANEOUS) ×1 IMPLANT
ELECTRODE REM PT RTRN 9FT ADLT (ELECTROSURGICAL) ×1 IMPLANT
FACESHIELD WRAPAROUND (MASK) ×6 IMPLANT
FACESHIELD WRAPAROUND OR TEAM (MASK) ×2 IMPLANT
GLOVE BIOGEL PI IND STRL 8 (GLOVE) ×2 IMPLANT
GLOVE BIOGEL PI INDICATOR 8 (GLOVE) ×4
GLOVE ECLIPSE 8.0 STRL XLNG CF (GLOVE) ×3 IMPLANT
GLOVE ORTHO TXT STRL SZ7.5 (GLOVE) ×6 IMPLANT
GOWN STRL REUS W/ TWL LRG LVL3 (GOWN DISPOSABLE) ×2 IMPLANT
GOWN STRL REUS W/ TWL XL LVL3 (GOWN DISPOSABLE) ×2 IMPLANT
GOWN STRL REUS W/TWL LRG LVL3 (GOWN DISPOSABLE) ×6
GOWN STRL REUS W/TWL XL LVL3 (GOWN DISPOSABLE) ×6
HANDPIECE INTERPULSE COAX TIP (DISPOSABLE) ×3
KIT BASIN OR (CUSTOM PROCEDURE TRAY) ×3 IMPLANT
KIT ROOM TURNOVER OR (KITS) ×3 IMPLANT
MANIFOLD NEPTUNE II (INSTRUMENTS) ×3 IMPLANT
NS IRRIG 1000ML POUR BTL (IV SOLUTION) ×3 IMPLANT
PACK TOTAL JOINT (CUSTOM PROCEDURE TRAY) ×3 IMPLANT
PACK UNIVERSAL I (CUSTOM PROCEDURE TRAY) ×3 IMPLANT
PAD ARMBOARD 7.5X6 YLW CONV (MISCELLANEOUS) ×3 IMPLANT
RETRACTOR WND ALEXIS 18 MED (MISCELLANEOUS) ×1 IMPLANT
RTRCTR WOUND ALEXIS 18CM MED (MISCELLANEOUS) ×3
SET HNDPC FAN SPRY TIP SCT (DISPOSABLE) ×1 IMPLANT
STAPLER VISISTAT 35W (STAPLE) IMPLANT
STRIP CLOSURE SKIN 1/2X4 (GAUZE/BANDAGES/DRESSINGS) ×4 IMPLANT
SUT ETHIBOND NAB CT1 #1 30IN (SUTURE) ×3 IMPLANT
SUT MNCRL AB 4-0 PS2 18 (SUTURE) IMPLANT
SUT VIC AB 0 CT1 27 (SUTURE) ×3
SUT VIC AB 0 CT1 27XBRD ANBCTR (SUTURE) ×1 IMPLANT
SUT VIC AB 1 CT1 27 (SUTURE) ×3
SUT VIC AB 1 CT1 27XBRD ANBCTR (SUTURE) ×1 IMPLANT
SUT VIC AB 2-0 CT1 27 (SUTURE) ×3
SUT VIC AB 2-0 CT1 TAPERPNT 27 (SUTURE) ×1 IMPLANT
TOWEL OR 17X24 6PK STRL BLUE (TOWEL DISPOSABLE) ×3 IMPLANT
TOWEL OR 17X26 10 PK STRL BLUE (TOWEL DISPOSABLE) ×3 IMPLANT
TRAY FOLEY CATH 16FRSI W/METER (SET/KITS/TRAYS/PACK) IMPLANT
WATER STERILE IRR 1000ML POUR (IV SOLUTION) ×6 IMPLANT

## 2015-05-13 NOTE — Progress Notes (Signed)
Utilization review completed.  

## 2015-05-13 NOTE — Consult Note (Signed)
[facility] Medicine Consult  Lindsey Payne VFI:[id number] DOB: [birth year] DOA: 05/13/2015  Referring physician: Jean Rosenthal, MD PCP: Dimas Chyle, MD   Chief Complaint: Uncontroleld blood pressures  HPI: Lindsey Payne is a 64 y.o. female with history of HTN arthritis hyperlipidemia CHF CAD presented to the hospital for a hip replacement. Patient underwent a right total hip replacement. Apparently the patient has been having difficulty with her blood pressures running on the low side. She has been given fluids with some improvement and the last BP recorded with in the 90s. She is on multiple medications that are responsible for her low blood pressure including 2 diuretics. Patient appears to be comfortable however in no distress at this time. She is awake and alert.   Review of Systems:  Complete 12 point ROS performed and is unremarkable other than HPI  Past Medical History  Diagnosis Date  . Arthritis   . Asthma   . Depression   . Hyperlipidemia   . Hypertension   . Adenoma of large intestine   . Back pain   . CHF (congestive heart failure) ([place])   . Tubular adenoma   . Hemorrhoids   . Lung collapse 1984    left lung, no problems since then  . Coronary artery disease    Past Surgical History  Procedure Laterality Date  . Colon surgery  1987    benign tubulovillous adenoma  . Heart stints      Dr. Wynonia Lawman  . Tonsillectomy    . Lung surgery Left     for collasped lung  . Abdominal hysterectomy      has one ovary left  . Back surgery      3 disc removed   Social History:  reports that she has been smoking Cigarettes.  She has been smoking about 0.25 packs per day. She has never used smokeless tobacco. She reports that she does not drink alcohol or use illicit drugs.  Allergies  Allergen Reactions  . Valium [Diazepam] Anxiety    Family History  Problem Relation Age of Onset  . Breast cancer Mother   . Colon cancer Father 65    died  .  Diabetes Father   . Diabetes Brother   . Heart disease Father      Prior to Admission medications   Medication Sig Start Date End Date Taking? Authorizing Provider  acetaminophen-codeine (TYLENOL #3) 300-30 MG tablet Take 1 tablet by mouth every 8 (eight) hours as needed for moderate pain.   Yes Historical Provider, MD  aspirin 81 MG EC tablet TAKE 1 TABLET BY MOUTH EVERY DAY   Yes Hilton Sinclair, MD  furosemide (LASIX) 40 MG tablet TAKE 1/2 TABLET TWICE A DAY Patient taking differently: Take 20 mg by mouth 2 (two) times daily. TAKE 1/2 TABLET TWICE A DAY 04/24/15  Yes Vivi Barrack, MD  hydrALAZINE (APRESOLINE) 50 MG tablet Take 50 mg by mouth 2 (two) times daily.  04/23/12  Yes Historical Provider, MD  isosorbide dinitrate (ISORDIL) 20 MG tablet TAKE 1 TABLET BY MOUTH TWICE A DAY 12/31/13  Yes Hilton Sinclair, MD  lisinopril (PRINIVIL,ZESTRIL) 20 MG tablet TAKE 1 TABLET (20 MG TOTAL) BY MOUTH DAILY. 02/06/15  Yes Vivi Barrack, MD  metoprolol succinate (TOPROL-XL) 25 MG 24 hr tablet Take 1 tablet (25 mg total) by mouth 2 (two) times daily. 02/06/15  Yes Vivi Barrack, MD  PARoxetine (PAXIL) 40 MG tablet TAKE 1 TABLET (40 MG TOTAL) BY  MOUTH EVERY MORNING. 05/06/14  Yes Hilton Sinclair, MD  potassium chloride SA (KLOR-CON M20) 20 MEQ tablet Take 1 tablet (20 mEq total) by mouth 2 (two) times daily. 06/18/14  Yes Hilton Sinclair, MD  spironolactone (ALDACTONE) 25 MG tablet TAKE 1 TABLET (25 MG TOTAL) BY MOUTH DAILY. 06/24/14  Yes Hilton Sinclair, MD  gabapentin (NEURONTIN) 300 MG capsule Take 1 capsule (300 mg total) by mouth 3 (three) times daily. Patient not taking: Reported on 05/06/2015 03/10/15   Charlett Blake, MD  hydroxypropyl methylcellulose / hypromellose (ISOPTO TEARS / GONIOVISC) 2.5 % ophthalmic solution Place 1 drop into the left eye 3 (three) times daily. Patient not taking: Reported on 05/06/2015 04/26/15   Montine Circle, PA-C  ibuprofen  (ADVIL,MOTRIN) 800 MG tablet Take 1 tablet (800 mg total) by mouth 3 (three) times daily. Patient not taking: Reported on 05/06/2015 03/22/14   Tatyana Kirichenko, PA-C  traMADol (ULTRAM) 50 MG tablet Take 0.5 tablets (25 mg total) by mouth daily as needed. For 1 week when pain severe Patient not taking: Reported on 05/06/2015 11/15/14   Hilton Sinclair, MD   Physical Exam: Filed Vitals:   05/13/15 1957 05/13/15 2218 05/13/15 2228 05/13/15 2229  BP: 94/45 98/53 92/58  84/50  Pulse: 78     Temp: 98.3 F (36.8 C)     TempSrc: Oral     Resp: 16     Weight:      SpO2: 98%       Wt Readings from Last 3 Encounters:  05/13/15 85.73 kg (189 lb)  05/07/15 85.8 kg (189 lb 2.5 oz)  11/15/14 83.462 kg (184 lb)    General:  Appears calm and comfortable Eyes: PERRL, normal lids, irises & conjunctiva ENT: grossly normal hearing, lips & tongue Neck: no LAD, masses or thyromegaly Cardiovascular: RRR, no m/r/g. No LE edema. Respiratory: CTA bilaterally, no w/r/r. Normal respiratory effort. Abdomen: soft, ntnd Skin: no rash or induration seen on limited exam Musculoskeletal: grossly normal tone BUE/BLE Psychiatric: grossly normal mood and affect Neurologic: grossly non-focal.          Labs on Admission:  Basic Metabolic Panel:  Recent Labs Lab 05/07/15 1049  NA 138  K 4.0  CL 105  CO2 24  GLUCOSE 99  BUN 14  CREATININE 1.20*  CALCIUM 9.7   Liver Function Tests: No results for input(s): AST, ALT, ALKPHOS, BILITOT, PROT, ALBUMIN in the last 168 hours. No results for input(s): LIPASE, AMYLASE in the last 168 hours. No results for input(s): AMMONIA in the last 168 hours. CBC:  Recent Labs Lab 05/07/15 1049  WBC 5.6  HGB 13.0  HCT 39.0  MCV 85.2  PLT 281   Cardiac Enzymes: No results for input(s): CKTOTAL, CKMB, CKMBINDEX, TROPONINI in the last 168 hours.  BNP (last 3 results) No results for input(s): BNP in the last 8760 hours.  ProBNP (last 3 results) No results  for input(s): PROBNP in the last 8760 hours.  CBG: No results for input(s): GLUCAP in the last 168 hours.  Radiological Exams on Admission: Dg Hip Port Unilat With Pelvis 1v Right  05/13/2015  CLINICAL DATA:  Status post total hip replacement on the right EXAM: DG HIP (WITH OR WITHOUT PELVIS) 2-3V PORT RIGHT COMPARISON:  Intraoperative images obtained earlier in the day FINDINGS: Frontal pelvis as well as frontal and lateral right hip images were obtained. There is a total hip prosthesis on the right with prosthetic components appearing well-seated. No acute fracture  or dislocation. Air on the right side is an expected postoperative finding. There is moderate narrowing of the left hip joint. No erosive change. IMPRESSION: Postoperative change on the right. Prosthetic components on the right appear well seated. Narrowing left hip joint. No acute fracture or dislocation. Electronically Signed   By: Lowella Grip III M.D.   On: 05/13/2015 15:40   Dg Hip Operative Unilat With Pelvis Right  05/13/2015  CLINICAL DATA:  Right total hip arthroplasty. EXAM: OPERATIVE RIGHT HIP (WITH PELVIS IF PERFORMED) 2 VIEWS TECHNIQUE: Fluoroscopic spot image(s) were submitted for interpretation post-operatively. COMPARISON:  None. FINDINGS: Two intraoperative views of the right hip are submitted postoperatively for interpretation. Right total hip arthroplasty identified without definite complicating features. IMPRESSION: Right total hip arthroplasty without definite complicating features. Electronically Signed   By: Margarette Canada M.D.   On: 05/13/2015 14:51     Assessment/Plan Principal Problem:   Osteoarthritis of right hip Active Problems:   Status post total replacement of right hip   1. Low Blood pressure -would hold her antihypertensive medications overnight -she has been started on IVF by ortho PA. Would monitor closely as she does have a history of CHF -resume her meds once the BP is improved tonight or  in the morning  2. S/p Right hip replacement -per ortho  3. CHF -monitor IOs -hold any further fluids after current bolus   Code Status: full code (must indicate code status--if unknown or must be presumed, indicate so) DVT Prophylaxis:SCD Family Communication: none (indicate person spoken with, if applicable, with phone number if by telephone) Disposition Plan: rehab (indicate anticipated LOS)    [facility] Pager [phone]

## 2015-05-13 NOTE — Brief Op Note (Signed)
05/13/2015  2:48 PM  PATIENT:  Lindsey Payne  64 y.o. female  PRE-OPERATIVE DIAGNOSIS:  Severe osteoarthritis right hip  POST-OPERATIVE DIAGNOSIS:  Severe osteoarthritis right hip  PROCEDURE:  Procedure(s): RIGHT TOTAL HIP ARTHROPLASTY ANTERIOR APPROACH (Right)  SURGEON:  Surgeon(s) and Role:    * Mcarthur Rossetti, MD - Primary  PHYSICIAN ASSISTANT: Benita Stabile, PA-C  ANESTHESIA:   spinal  EBL:  Total I/O In: 1000 [I.V.:1000] Out: 550 [Urine:350; Blood:200]  BLOOD ADMINISTERED:none  DRAINS: none   LOCAL MEDICATIONS USED:  NONE  SPECIMEN:  No Specimen  DISPOSITION OF SPECIMEN:  N/A  COUNTS:  YES  TOURNIQUET:  * No tourniquets in log *  DICTATION: .Other Dictation: Dictation Number [id number]  PLAN OF CARE: Admit to inpatient   PATIENT DISPOSITION:  PACU - hemodynamically stable.   Delay start of Pharmacological VTE agent (>24hrs) due to surgical blood loss or risk of bleeding: no

## 2015-05-13 NOTE — Transfer of Care (Signed)
Immediate Anesthesia Transfer of Care Note  Patient: Lindsey Payne  Procedure(s) Performed: Procedure(s): RIGHT TOTAL HIP ARTHROPLASTY ANTERIOR APPROACH (Right)  Patient Location: PACU  Anesthesia Type:Spinal  Level of Consciousness: awake, alert , oriented and patient cooperative  Airway & Oxygen Therapy: Patient Spontanous Breathing and Patient connected to nasal cannula oxygen  Post-op Assessment: Report given to RN and Post -op Vital signs reviewed and stable  Post vital signs: Reviewed and stable  Last Vitals:  Filed Vitals:   05/13/15 1515  BP:   Pulse:   Temp: 36.4 C  Resp:     Complications: No apparent anesthesia complications

## 2015-05-13 NOTE — Anesthesia Preprocedure Evaluation (Addendum)
Anesthesia Evaluation  Patient identified by MRN, date of birth, ID band Patient awake    Reviewed: Allergy & Precautions, NPO status , Patient's Chart, lab work & pertinent test results, reviewed documented beta blocker date and time   History of Anesthesia Complications Negative for: history of anesthetic complications  Airway Mallampati: II  TM Distance: >3 FB Neck ROM: Full    Dental  (+) Dental Advisory Given, Missing, Poor Dentition, Chipped   Pulmonary asthma , Current Smoker,    Pulmonary exam normal breath sounds clear to auscultation       Cardiovascular hypertension, Pt. on medications and Pt. on home beta blockers (-) angina+ CAD and +CHF  (-) Past MI negative cardio ROS Normal cardiovascular exam Rhythm:Regular Rate:Normal     Neuro/Psych PSYCHIATRIC DISORDERS Anxiety Depression negative neurological ROS     GI/Hepatic negative GI ROS, Neg liver ROS,   Endo/Other  negative endocrine ROS  Renal/GU negative Renal ROS     Musculoskeletal  (+) Arthritis , Osteoarthritis,    Abdominal   Peds  Hematology  (+) Blood dyscrasia, Sickle cell trait ,   Anesthesia Other Findings Day of surgery medications reviewed with the patient.  Reproductive/Obstetrics                           Anesthesia Physical Anesthesia Plan  ASA: III  Anesthesia Plan: MAC and Spinal   Post-op Pain Management:    Induction: Intravenous  Airway Management Planned: Simple Face Mask  Additional Equipment:   Intra-op Plan:   Post-operative Plan:   Informed Consent: I have reviewed the patients History and Physical, chart, labs and discussed the procedure including the risks, benefits and alternatives for the proposed anesthesia with the patient or authorized representative who has indicated his/her understanding and acceptance.   Dental advisory given  Plan Discussed with: CRNA  Anesthesia Plan  Comments: (Discussed risks and benefits of and differences between spinal and general. Discussed risks of spinal including headache, backache, failure, bleeding, infection, and nerve damage. Patient consents to spinal. Questions answered. Coagulation studies and platelet count acceptable.)       EKG 09/02/2014: sinus rhythm. Nonspecific ST and T wave changes  Echo 07/16/2009:  - Moderate concentric LVH with mild reduction in LV systolic function. EF 46% - Mild LAE - Mild MR  Cardiac cath 08/26/2005:  1. Calcified proximal LAD with what appears to be mild proximal stenosis involving the left anterior descending. 2. 40-50% stenosis ostial RCA 3. Concentric LVH with some interval improvement in EF since last study. Anesthesia Quick Evaluation

## 2015-05-13 NOTE — Progress Notes (Signed)
BP 94/45, hr 74, 100% O2 sat on RA, pt awake, alert, wants her dinner. Floor RN Antonique has paged MD to notify of BP-she will cont to monitor pt.

## 2015-05-13 NOTE — H&P (Signed)
TOTAL HIP ADMISSION H&P  Patient is admitted for right total hip arthroplasty.  Subjective:  Chief Complaint: right hip pain  HPI: Lindsey Payne, 64 y.o. female, has a history of pain and functional disability in the right hip(s) due to arthritis and patient has failed non-surgical conservative treatments for greater than 12 weeks to include NSAID's and/or analgesics, corticosteriod injections, flexibility and strengthening excercises, supervised PT with diminished ADL's post treatment, use of assistive devices, weight reduction as appropriate and activity modification.  Onset of symptoms was gradual starting 2 years ago with gradually worsening course since that time.The patient noted no past surgery on the right hip(s).  Patient currently rates pain in the right hip at 10 out of 10 with activity. Patient has night pain, worsening of pain with activity and weight bearing, trendelenberg gait, pain that interfers with activities of daily living, pain with passive range of motion and crepitus. Patient has evidence of subchondral cysts, subchondral sclerosis, periarticular osteophytes and joint space narrowing by imaging studies. This condition presents safety issues increasing the risk of falls.  There is no current active infection.  Patient Active Problem List   Diagnosis Date Noted  . Osteoarthritis of left hip 05/13/2015  . Lumbar pain with radiation down right leg 10/23/2014  . Ganglion cyst of wrist 10/23/2014  . Breast mass seen on mammogram 03/19/2013  . Rectal bleeding 01/30/2013  . Hemorrhoids 07/10/2012  . Health care maintenance 05/30/2012  . Back pain with R radiculopathy. 12/15/2011  . HERNIATED DISC 12/04/2009  . TOBACCO ABUSE 04/04/2007  . TUBULOVILLOUS ADENOMA, COLON, HX OF 11/17/2006  . Hyperlipidemia 09/08/2006  . ANXIETY 09/08/2006  . DEPRESSIVE DISORDER, NOS 09/08/2006  . HYPERTENSION, BENIGN SYSTEMIC 09/08/2006  . CORONARY, ARTERIOSCLEROSIS 09/08/2006  .  CARDIOMYOPATHY, ISCHEMIC 09/08/2006  . Chronic systolic heart failure ([place]) 09/08/2006   Past Medical History  Diagnosis Date  . Arthritis   . Asthma   . Depression   . Hyperlipidemia   . Hypertension   . Adenoma of large intestine   . Back pain   . CHF (congestive heart failure) ([place])   . Tubular adenoma   . Hemorrhoids   . Lung collapse 1984    left lung, no problems since then  . Coronary artery disease     Past Surgical History  Procedure Laterality Date  . Colon surgery  1987    benign tubulovillous adenoma  . Heart stints      Dr. Wynonia Lawman  . Tonsillectomy    . Lung surgery Left     for collasped lung  . Abdominal hysterectomy      has one ovary left  . Back surgery      3 disc removed    Prescriptions prior to admission  Medication Sig Dispense Refill Last Dose  . acetaminophen-codeine (TYLENOL #3) 300-30 MG tablet Take 1 tablet by mouth every 8 (eight) hours as needed for moderate pain.   05/12/2015 at Unknown time  . aspirin 81 MG EC tablet TAKE 1 TABLET BY MOUTH EVERY DAY 30 tablet 5 Past Week at Unknown time  . furosemide (LASIX) 40 MG tablet TAKE 1/2 TABLET TWICE A DAY (Patient taking differently: Take 20 mg by mouth 2 (two) times daily. TAKE 1/2 TABLET TWICE A DAY) 90 tablet 1 05/13/2015 at 0700  . hydrALAZINE (APRESOLINE) 50 MG tablet Take 50 mg by mouth 2 (two) times daily.    05/13/2015 at 0700  . isosorbide dinitrate (ISORDIL) 20 MG tablet TAKE 1 TABLET BY  MOUTH TWICE A DAY 180 tablet 1 05/13/2015 at 0700  . lisinopril (PRINIVIL,ZESTRIL) 20 MG tablet TAKE 1 TABLET (20 MG TOTAL) BY MOUTH DAILY. 90 tablet 1 05/13/2015 at 0700  . metoprolol succinate (TOPROL-XL) 25 MG 24 hr tablet Take 1 tablet (25 mg total) by mouth 2 (two) times daily. 60 tablet 2 05/13/2015 at 0700  . PARoxetine (PAXIL) 40 MG tablet TAKE 1 TABLET (40 MG TOTAL) BY MOUTH EVERY MORNING. 90 tablet 3 05/13/2015 at 0700  . potassium chloride SA (KLOR-CON M20) 20 MEQ tablet Take 1 tablet (20 mEq total) by  mouth 2 (two) times daily. 180 tablet 1 05/13/2015 at 0700  . spironolactone (ALDACTONE) 25 MG tablet TAKE 1 TABLET (25 MG TOTAL) BY MOUTH DAILY. 90 tablet 2 05/13/2015 at 0700  . gabapentin (NEURONTIN) 300 MG capsule Take 1 capsule (300 mg total) by mouth 3 (three) times daily. (Patient not taking: Reported on 05/06/2015) 90 capsule 1 Not Taking at Unknown time  . hydroxypropyl methylcellulose / hypromellose (ISOPTO TEARS / GONIOVISC) 2.5 % ophthalmic solution Place 1 drop into the left eye 3 (three) times daily. (Patient not taking: Reported on 05/06/2015) 15 mL 0 Not Taking at Unknown time  . ibuprofen (ADVIL,MOTRIN) 800 MG tablet Take 1 tablet (800 mg total) by mouth 3 (three) times daily. (Patient not taking: Reported on 05/06/2015) 21 tablet 0 Not Taking at Unknown time  . traMADol (ULTRAM) 50 MG tablet Take 0.5 tablets (25 mg total) by mouth daily as needed. For 1 week when pain severe (Patient not taking: Reported on 05/06/2015) 10 tablet 0 Not Taking at Unknown time   Allergies  Allergen Reactions  . Valium [Diazepam] Anxiety    Social History  Substance Use Topics  . Smoking status: Current Every Day Smoker -- 0.25 packs/day    Types: Cigarettes  . Smokeless tobacco: Never Used     Comment: Tobacco info given 02/28/13  . Alcohol Use: No    Family History  Problem Relation Age of Onset  . Breast cancer Mother   . Colon cancer Father 33    died  . Diabetes Father   . Diabetes Brother   . Heart disease Father      Review of Systems  Musculoskeletal: Positive for joint pain.  All other systems reviewed and are negative.   Objective:  Physical Exam  Constitutional: She is oriented to person, place, and time. She appears well-developed and well-nourished.  HENT:  Head: Normocephalic and atraumatic.  Eyes: EOM are normal. Pupils are equal, round, and reactive to light.  Neck: Normal range of motion. Neck supple.  Cardiovascular: Normal rate and regular rhythm.    Respiratory: Effort normal and breath sounds normal.  GI: Soft. Bowel sounds are normal.  Musculoskeletal:       Right hip: She exhibits decreased range of motion, decreased strength, tenderness and bony tenderness.  Neurological: She is alert and oriented to person, place, and time.  Skin: Skin is warm and dry.  Psychiatric: She has a normal mood and affect.    Vital signs in last 24 hours: Temp:  [98.2 F (36.8 C)] 98.2 F (36.8 C) (11/01 1046) Pulse Rate:  [79] 79 (11/01 1046) Resp:  [20] 20 (11/01 1046) BP: (150)/(67) 150/67 mmHg (11/01 1046) SpO2:  [100 %] 100 % (11/01 1046) Weight:  [85.73 kg (189 lb)] 85.73 kg (189 lb) (11/01 1046)  Labs:   Estimated body mass index is 28.74 kg/(m^2) as calculated from the following:   Height as  of 05/07/15: 5\' 8"  (1.727 m).   Weight as of this encounter: 85.73 kg (189 lb).   Imaging Review Plain radiographs demonstrate severe degenerative joint disease of the right hip(s). The bone quality appears to be good for age and reported activity level.  Assessment/Plan:  End stage arthritis, right hip(s)  The patient history, physical examination, clinical judgement of the provider and imaging studies are consistent with end stage degenerative joint disease of the right hip(s) and total hip arthroplasty is deemed medically necessary. The treatment options including medical management, injection therapy, arthroscopy and arthroplasty were discussed at length. The risks and benefits of total hip arthroplasty were presented and reviewed. The risks due to aseptic loosening, infection, stiffness, dislocation/subluxation,  thromboembolic complications and other imponderables were discussed.  The patient acknowledged the explanation, agreed to proceed with the plan and consent was signed. Patient is being admitted for inpatient treatment for surgery, pain control, PT, OT, prophylactic antibiotics, VTE prophylaxis, progressive ambulation and ADL's and  discharge planning.The patient is planning to be discharged home with home health services

## 2015-05-13 NOTE — Progress Notes (Signed)
Pt presented to 5N after  Right total hip surgery with Low Blood pressure running 80s/50s; Md paged and modified orders to run fluids at 132ml/hr until 11pm and then resume 75 ml/ hr. Pressures increased to 90s/50s. Pt A&o. No apparent signs of distress. Per Dr Humphrey Rolls, MD would like to hold her antihypertensive medications overnight, she has been started on IVF by ortho PA, Petraca. Will monitor closely as she does have a history of CHF,resume her meds once the BP is improved. Will continue to monitor patient

## 2015-05-14 ENCOUNTER — Encounter (HOSPITAL_COMMUNITY): Payer: Self-pay | Admitting: Orthopaedic Surgery

## 2015-05-14 DIAGNOSIS — I959 Hypotension, unspecified: Secondary | ICD-10-CM

## 2015-05-14 DIAGNOSIS — Z96649 Presence of unspecified artificial hip joint: Secondary | ICD-10-CM

## 2015-05-14 DIAGNOSIS — I9589 Other hypotension: Secondary | ICD-10-CM

## 2015-05-14 DIAGNOSIS — I5022 Chronic systolic (congestive) heart failure: Secondary | ICD-10-CM

## 2015-05-14 DIAGNOSIS — M1611 Unilateral primary osteoarthritis, right hip: Principal | ICD-10-CM

## 2015-05-14 LAB — COMPREHENSIVE METABOLIC PANEL
ALT: 19 U/L (ref 14–54)
ANION GAP: 13 (ref 5–15)
AST: 34 U/L (ref 15–41)
Albumin: 3.2 g/dL — ABNORMAL LOW (ref 3.5–5.0)
Alkaline Phosphatase: 60 U/L (ref 38–126)
BUN: 18 mg/dL (ref 6–20)
CHLORIDE: 108 mmol/L (ref 101–111)
CO2: 20 mmol/L — ABNORMAL LOW (ref 22–32)
Calcium: 8.9 mg/dL (ref 8.9–10.3)
Creatinine, Ser: 1.73 mg/dL — ABNORMAL HIGH (ref 0.44–1.00)
GFR, EST AFRICAN AMERICAN: 35 mL/min — AB (ref >60)
GFR, EST NON AFRICAN AMERICAN: 30 mL/min — AB (ref >60)
Glucose, Bld: 111 mg/dL — ABNORMAL HIGH (ref 65–99)
POTASSIUM: 4.1 mmol/L (ref 3.5–5.1)
Sodium: 141 mmol/L (ref 135–145)
Total Bilirubin: 0.7 mg/dL (ref 0.3–1.2)
Total Protein: 5.7 g/dL — ABNORMAL LOW (ref 6.5–8.1)

## 2015-05-14 LAB — CBC
HEMATOCRIT: 30.6 % — AB (ref 36.0–46.0)
HEMOGLOBIN: 10.3 g/dL — AB (ref 12.0–15.0)
MCH: 28.9 pg (ref 26.0–34.0)
MCHC: 33.7 g/dL (ref 30.0–36.0)
MCV: 86 fL (ref 78.0–100.0)
Platelets: 208 10*3/uL (ref 150–400)
RBC: 3.56 MIL/uL — ABNORMAL LOW (ref 3.87–5.11)
RDW: 13.8 % (ref 11.5–15.5)
WBC: 8.2 10*3/uL (ref 4.0–10.5)

## 2015-05-14 LAB — CBC WITH DIFFERENTIAL/PLATELET
BASOS ABS: 0 10*3/uL (ref 0.0–0.1)
Basophils Relative: 0 %
EOS PCT: 1 %
Eosinophils Absolute: 0.1 10*3/uL (ref 0.0–0.7)
HCT: 31.2 % — ABNORMAL LOW (ref 36.0–46.0)
Hemoglobin: 10.1 g/dL — ABNORMAL LOW (ref 12.0–15.0)
LYMPHS PCT: 15 %
Lymphs Abs: 1.3 10*3/uL (ref 0.7–4.0)
MCH: 27.7 pg (ref 26.0–34.0)
MCHC: 32.4 g/dL (ref 30.0–36.0)
MCV: 85.5 fL (ref 78.0–100.0)
MONO ABS: 1.1 10*3/uL — AB (ref 0.1–1.0)
Monocytes Relative: 12 %
Neutro Abs: 6.3 10*3/uL (ref 1.7–7.7)
Neutrophils Relative %: 72 %
PLATELETS: 198 10*3/uL (ref 150–400)
RBC: 3.65 MIL/uL — ABNORMAL LOW (ref 3.87–5.11)
RDW: 13.6 % (ref 11.5–15.5)
WBC: 8.8 10*3/uL (ref 4.0–10.5)

## 2015-05-14 LAB — BASIC METABOLIC PANEL
ANION GAP: 7 (ref 5–15)
BUN: 18 mg/dL (ref 6–20)
CALCIUM: 9 mg/dL (ref 8.9–10.3)
CO2: 23 mmol/L (ref 22–32)
Chloride: 104 mmol/L (ref 101–111)
Creatinine, Ser: 1.73 mg/dL — ABNORMAL HIGH (ref 0.44–1.00)
GFR, EST AFRICAN AMERICAN: 35 mL/min — AB (ref >60)
GFR, EST NON AFRICAN AMERICAN: 30 mL/min — AB (ref >60)
Glucose, Bld: 112 mg/dL — ABNORMAL HIGH (ref 65–99)
Potassium: 4.3 mmol/L (ref 3.5–5.1)
Sodium: 134 mmol/L — ABNORMAL LOW (ref 135–145)

## 2015-05-14 MED ORDER — METOPROLOL SUCCINATE ER 25 MG PO TB24
12.5000 mg | ORAL_TABLET | Freq: Every day | ORAL | Status: DC
  Administered 2015-05-14: 12.5 mg via ORAL
  Filled 2015-05-14 (×2): qty 1

## 2015-05-14 MED ORDER — PNEUMOCOCCAL VAC POLYVALENT 25 MCG/0.5ML IJ INJ
0.5000 mL | INJECTION | INTRAMUSCULAR | Status: AC
  Administered 2015-05-15: 0.5 mL via INTRAMUSCULAR
  Filled 2015-05-14: qty 0.5

## 2015-05-14 NOTE — Progress Notes (Signed)
Physical Therapy Treatment Patient Details Name: Lindsey Payne MRN: [id number] DOB: 23-Oct-1950 Today's Date: 05/14/2015    History of Present Illness Pt is 64 y/o female s/p right total hip arthroplasty. PMH includes CAD,CHF, HTN, and arthritis.    PT Comments    Pt is progressing well with mobility. She walked 33' with RW with min/guard assist. Performed R THA exercises with min A.   Follow Up Recommendations  Home health PT;Supervision for mobility/OOB     Equipment Recommendations  3in1 (PT)    Recommendations for Other Services       Precautions / Restrictions Precautions Precautions: Fall Precaution Comments: direct anterior approach Restrictions Weight Bearing Restrictions: Yes RLE Weight Bearing: Weight bearing as tolerated    Mobility  Bed Mobility Overal bed mobility: Needs Assistance Bed Mobility: Supine to Sit     Supine to sit: Min assist     General bed mobility comments: min A for RLE  Transfers Overall transfer level: Needs assistance Equipment used: Rolling walker (2 wheeled) Transfers: Sit to/from Stand Sit to Stand: Min assist         General transfer comment: verbal cues for hand placement, min A to rise  Ambulation/Gait Ambulation/Gait assistance: Min guard Ambulation Distance (Feet): 70 Feet Assistive device: Rolling walker (2 wheeled) Gait Pattern/deviations: Step-through pattern   Gait velocity interpretation: Below normal speed for age/gender General Gait Details: steady with RW, verbal cues for sequencing and positioning in RW   Stairs            Wheelchair Mobility    Modified Rankin (Stroke Patients Only)       Balance     Sitting balance-Leahy Scale: Good Sitting balance - Comments: seated EOB and washing UB     Standing balance-Leahy Scale: Fair                      Cognition Arousal/Alertness: Awake/alert Behavior During Therapy: WFL for tasks assessed/performed Overall Cognitive  Status: Within Functional Limits for tasks assessed                      Exercises Total Joint Exercises Ankle Circles/Pumps: AROM;Both;10 reps;Seated Quad Sets: AROM;Right;5 reps;Supine Short Arc Quad: AROM;Right;10 reps;Supine Heel Slides: AAROM;Right;10 reps;Supine Hip ABduction/ADduction: AAROM;Right;10 reps;Supine Long Arc Quad: AROM;Right;10 reps;Seated    General Comments        Pertinent Vitals/Pain Pain Score: 0-No pain Pain Intervention(s): Monitored during session;Premedicated before session;Ice applied    Home Living                      Prior Function            PT Goals (current goals can now be found in the care plan section) Acute Rehab PT Goals Patient Stated Goal: to walk better, be able to cook PT Goal Formulation: With patient Time For Goal Achievement: 05/21/15 Potential to Achieve Goals: Good Progress towards PT goals: Progressing toward goals    Frequency  7X/week    PT Plan Current plan remains appropriate    Co-evaluation             End of Session Equipment Utilized During Treatment: Gait belt Activity Tolerance: Patient tolerated treatment well Patient left: in chair;with call bell/phone within reach     Time: 1424-1450 PT Time Calculation (min) (ACUTE ONLY): 26 min  Charges:  $Gait Training: 8-22 mins $Therapeutic Exercise: 8-22 mins  G Codes:      Philomena Doheny 05/14/2015, 2:55 PM [phone]

## 2015-05-14 NOTE — Progress Notes (Signed)
Subjective: 1 Day Post-Op Procedure(s) (LRB): RIGHT TOTAL HIP ARTHROPLASTY ANTERIOR APPROACH (Right) Patient reports pain as moderate.  Blood pressure stable this am.  Only small drop in hgb post-op - acute blood loss anemia.  Low BP likely related to anesthesia, pain meds, and BP meds.  Feels ok this am.  Objective: Vital signs in last 24 hours: Temp:  [97.6 F (36.4 C)-98.8 F (37.1 C)] 98 F (36.7 C) (11/02 0500) Pulse Rate:  [55-84] 84 (11/02 0500) Resp:  [11-20] 18 (11/02 0500) BP: (84-150)/(45-82) 101/56 mmHg (11/02 0500) SpO2:  [94 %-100 %] 98 % (11/02 0500) Weight:  [85.73 kg (189 lb)] 85.73 kg (189 lb) (11/01 1046)  Intake/Output from previous day: 11/01 0701 - 11/02 0700 In: 2870 [P.O.:200; I.V.:2670] Out: 1270 [EAVWU:9811; Blood:200] Intake/Output this shift: Total I/O In: 1670 [P.O.:200; I.V.:1470] Out: 720 [Urine:720]   Recent Labs  05/13/15 2329 05/14/15 0113  HGB 10.5* 10.1*    Recent Labs  05/13/15 2329 05/14/15 0113  WBC 10.4 8.8  RBC 3.66* 3.65*  HCT 31.6* 31.2*  PLT 202 198    Recent Labs  05/13/15 2329 05/14/15 0113  NA 135 141  K 3.9 4.1  CL 104 108  CO2 23 20*  BUN 17 18  CREATININE 1.79* 1.73*  GLUCOSE 122* 111*  CALCIUM 8.5* 8.9   No results for input(s): LABPT, INR in the last 72 hours.  Sensation intact distally Intact pulses distally Dorsiflexion/Plantar flexion intact Incision: dressing C/D/I  Assessment/Plan: 1 Day Post-Op Procedure(s) (LRB): RIGHT TOTAL HIP ARTHROPLASTY ANTERIOR APPROACH (Right) Up with therapy  Stacee Earp Y 05/14/2015, 6:52 AM

## 2015-05-14 NOTE — Progress Notes (Signed)
Occupational Therapy Evaluation Patient Details Name: Lindsey Payne MRN: [id number] DOB: 07/30/1950 Today's Date: 05/14/2015    History of Present Illness Pt is 64 y/o female s/p right total hip arthroplasty. PMH includes CAD,CHF, HTN, and arthritis.   Clinical Impression   Patient presenting with deconditioning, decreased safety, and decreased I in self care.  Patient reports being I  PTA. Patient currently functioning min A overall. Patient will benefit from acute OT to increase overall independence in the areas of ADLs, functional mobility, and safety in order to safely discharge home with caregiver.    Follow Up Recommendations  No OT follow up;Supervision - Intermittent    Equipment Recommendations  3 in 1 bedside comode    Recommendations for Other Services  (none at this time)     Precautions / Restrictions Precautions Precautions: Fall Precaution Comments: direct anterior approach Restrictions Weight Bearing Restrictions: Yes RLE Weight Bearing: Weight bearing as tolerated      Mobility Bed Mobility       General bed mobility comments: Pt sitting on EOB upon entering the room  Transfers Overall transfer level: Needs assistance Equipment used: Rolling walker (2 wheeled) Transfers: Sit to/from [facility] Sit to Stand: Min assist Stand pivot transfers: Min guard            Balance Overall balance assessment: Needs assistance Sitting-balance support: No upper extremity supported;Feet supported Sitting balance-Leahy Scale: Good Sitting balance - Comments: seated EOB and washing UB   Standing balance support: Single extremity supported;During functional activity Standing balance-Leahy Scale: Fair Standing balance comment: reliance on RW and steady assist for safety           ADL Overall ADL's : Needs assistance/impaired          General ADL Comments: Upon entering the room, pt seated on EOB attempting to wash self with  basin placed in front of her. Pt requriing min A sit <>stand from standard height bed with use of RW and verbal cues for technique and hand placement. Pt required steady assist for balance while washing peri are and buttocks. Pt  ambulating ~8 ' with RW and steady assist for balance  to sit onto  recliner chair in room. Once seated, pt  required assistance to wash B feet and lower legs. B LEs elevated and call bell within reach upon exiting the room. OT briefly discussed possible need for AE in order to increase I with self care. Pt verbalized understanding and education to continue.                Pertinent Vitals/Pain Pain Assessment: 0-10 Pain Score: 4  Pain Location: R hip Pain Descriptors / Indicators: Aching;Sore Pain Intervention(s): Monitored during session;Repositioned     Hand Dominance Right   Extremity/Trunk Assessment Upper Extremity Assessment Upper Extremity Assessment: Generalized weakness   Lower Extremity Assessment Lower Extremity Assessment: Defer to PT evaluation       Communication Communication Communication: No difficulties   Cognition Arousal/Alertness: Awake/alert Behavior During Therapy: WFL for tasks assessed/performed Overall Cognitive Status: Within Functional Limits for tasks assessed                   Home Living Family/patient expects to be discharged to:: Private residence Living Arrangements: Spouse/significant other Available Help at Discharge: Friend(s);Family;Available 24 hours/day Type of Home: Mobile home Home Access: Stairs to enter Entrance Stairs-Number of Steps: 3 STE Entrance Stairs-Rails: Can reach both Home Layout: One level     Bathroom Shower/Tub: Walk-in shower  Bathroom Toilet: Standard Bathroom Accessibility: Yes How Accessible: Accessible via walker Home Equipment: [place] - 2 wheels;Shower seat - built in          Prior Functioning/Environment Level of Independence: Independent             OT  Diagnosis: Generalized weakness;Acute pain   OT Problem List: Decreased strength;Decreased activity tolerance;Impaired balance (sitting and/or standing);Decreased safety awareness;Pain;Decreased knowledge of use of DME or AE   OT Treatment/Interventions: Self-care/ADL training;Balance training;Therapeutic exercise;Therapeutic activities;Energy conservation;DME and/or AE instruction;Patient/family education    OT Goals(Current goals can be found in the care plan section) Acute Rehab OT Goals Patient Stated Goal: to go home  OT Goal Formulation: With patient Time For Goal Achievement: 05/28/15 Potential to Achieve Goals: Good ADL Goals Pt Will Perform Lower Body Bathing: with modified independence;sit to/from stand;with adaptive equipment Pt Will Perform Lower Body Dressing: with modified independence;with adaptive equipment;sit to/from stand Pt Will Transfer to Toilet: with modified independence;ambulating Pt Will Perform Toileting - Clothing Manipulation and hygiene: with modified independence;sit to/from stand;with adaptive equipment Pt Will Perform Tub/Shower Transfer: Shower transfer;with supervision;shower seat;ambulating;Stand pivot transfer;rolling walker  OT Frequency: Min 2X/week   Barriers to D/C:    none known at this time          End of Session Equipment Utilized During Treatment: Rolling walker  Activity Tolerance: Patient tolerated treatment well Patient left: in chair;with call bell/phone within reach   Time: 0824-0847 OT Time Calculation (min): 23 min Charges:  OT General Charges $OT Visit: 1 Procedure OT Evaluation $Initial OT Evaluation Tier I: 1 Procedure OT Treatments $Self Care/Home Management : 8-22 mins  Pittman, Alaine Loughney L, MS, OTR/L 05/14/2015, 9:01 AM

## 2015-05-14 NOTE — Care Management Note (Signed)
Case Management Note  Patient Details  Name: Lindsey Payne MRN: [id number] Date of Birth: 10-30-1950  Subjective/Objective:  64 yr old female s/p right total hip arthroplasty, anterior approach.                   Action/Plan:  Case manager spoke with patient at bedside concerning Discharge plans and home health needs. Patient states she will have assistance from her [facility] [employer] when she goes home. Choice was offered for home health agency. Referral was called by CM to [facility] [place] Liaison. Patient states she has a rolling walker but will need a 3in1. CM will order one for her.    Expected Discharge Date:                  Expected Discharge Plan:  [place]  In-House Referral:     Discharge planning Services  CM Consult  Post Acute Care Choice:  Home Health, Durable Medical Equipment Choice offered to:     DME Arranged:  3-N-1 DME Agency:  [place]:  PT [place]:  [place]  Status of Service:  Completed, signed off  Medicare Important Message Given:    Date Medicare IM Given:    Medicare IM give by:    Date Additional Medicare IM Given:    Additional Medicare Important Message give by:     If discussed at [place] of Stay Meetings, dates discussed:    Additional Comments:  Ninfa Meeker, RN 05/14/2015, 1:20 PM

## 2015-05-14 NOTE — Op Note (Signed)
Lindsey Payne, Lindsey Payne NO.:  [id number]  MEDICAL RECORD NO.:  [id number]  LOCATION:  5N03C                        FACILITY:  [place]  PHYSICIAN:  Lind Guest. Ninfa Linden, M.D.DATE OF BIRTH:  Mar 10, 1951  DATE OF PROCEDURE:  05/13/2015 DATE OF DISCHARGE:                              OPERATIVE REPORT   PREOPERATIVE DIAGNOSIS:  Primary osteoarthritis and degenerative joint disease, right hip.  POSTOPERATIVE DIAGNOSIS:  Primary osteoarthritis and degenerative joint disease, right hip.  PROCEDURE:  Right total hip arthroplasty through direct anterior approach.  IMPLANTS:  DePuy Sector Gription acetabular component size 52, size 36 +0 neutral polyethylene liner, size 11 Corail femoral component with varus offset (KLA), size 36 +1.5 ceramic hip ball.  SURGEON:  Lind Guest. Ninfa Linden, M.D.  ASSISTANT:  Erskine Emery, PA-C.  ANESTHESIA:  Spinal.  ANTIBIOTICS:  2 g of IV Ancef.  BLOOD LOSS:  200 mL.  COMPLICATIONS:  None.  INDICATIONS:  Ms. Brooker is a very pleasant 64 year old female with debilitating arthritis involving her right hip.  She has tried and failed all forms of conservative treatment.  She has gotten the point where it is affected her activities of daily living, her quality of life and her mobility.  She does have chronic back pain and chronic pain in general, but her pain is definitely localized these days at her hip.  X- rays showed complete loss of her joint space.  There were significant cystic changes in her hip, periarticular osteophytes and sclerotic changes consistent with arthritis.  At this point, a total hip arthroplasty was then recommended.  We have explained the risk of acute blood loss anemia, nerve and vessel injury, fracture, infection, DVT, and dislocation.  She understands our goals are decreased pain, improved mobility, and overall improved quality of life.  PROCEDURE DESCRIPTION:  After informed consent was obtained,  appropriate right hip was marked, she was brought to the operating room and kept supine on her stretcher after a spinal anesthesia was obtained.  A Foley catheter was placed and booth feet had traction boots applied to them. Next, she was placed supine on the Hana fracture table with a perineal post in place and both legs in inline skeletal traction devices, but no traction applied.  Her right operative hip was then prepped and draped with DuraPrep and sterile drapes.  Time-out was called and she was identified as the correct patient and correct right hip.  We then made an incision inferior and posterior to the anterior-superior iliac spine and carried this obliquely down the leg.  We dissected down the tensor fascia lata muscle.  The tensor fascia was then identified and divided longitudinally so we could proceed with a direct anterior approach to the hip.  We identified and cauterized the lateral femoral circumflex vessels and then identified the hip capsule.  We placed Cobra retractors around the lateral and medial femoral neck with medial one up underneath the rectus femoris.  We opened the joint capsule in an L-type format, finding of large joint effusion, placing the Cobra retractors within the joint capsule.  We then made our femoral neck cut with an oscillating saw proximal to the lesser trochanter and completed this with an osteotome.  We  placed a Corkscrew guide in the femoral head and removed the femoral head in its entirety and found it to be completely devoid of cartilage.  I then placed a bent Hohmann to the medial acetabular rim and cleaned the acetabular remnants of the acetabular labrum and another debris.  We then began reaming in 2-mm increments from a size 42 up to a size 52 with all reamers under direct visualization and the last reamer under direct fluoroscopy, so we could obtain our depth of reaming, our inclination, and anteversion.  Once we were pleased with this,  we placed the real DePuy Sector Gription acetabular component size 52, and a real 36+ 0 liner for that size acetabular component.  Attention was then turned to the femur.  With the leg externally rotated to 100 degrees, extended and abducted, we were able to place a Mueller retractor medially and a Hohmann retractor behind the greater trochanter.  I released the lateral joint capsule and used a box cutting osteotome to enter the femoral canal and a rongeur to lateralize and then began broaching from a size 8 broach up to a size 11.  Based off her anatomy, we trialed a varus offset femoral neck and a 36+ 1.5 hip ball.  We reduced this into the acetabulum.  We were pleased with leg length, offset and range of motion.  We then dislocated the hip and removed the trial components.  We then placed the real Corail femoral component from DePuy size 11 with varus offset and the real 36+ 1.5 ceramic hip ball, again reduced in the acetabulum and was stable.  We copiously irrigated the soft tissue with normal saline solution using pulsatile lavage.  We were able to close the joint capsule with interrupted #1 Ethibond suture followed by running #1 Vicryl in the tensor fascia, 0 Vicryl in the deep tissue, 2-0 Vicryl in the subcutaneous tissue, 4-0 Monocryl for the subcuticular stitch and Steri-Strips on the skin.  An Aquacel dressing was applied.  She was taken off the Hana table and taken to the recovery room in stable condition.  All final counts were correct.  There were no complications noted.  Of note, Erskine Emery, PA-C, assisted during the entire case, his assistance was crucial for facilitating all aspects of this case.     Lind Guest. Ninfa Linden, M.D.     CYB/MEDQ  D:  05/13/2015  T:  05/14/2015  Job:  [id number]

## 2015-05-14 NOTE — Progress Notes (Signed)
[facility] Consult Note   Lindsey Payne  EGB:[id number]  DOB: 04-07-1951  DOA: 05/13/2015 PCP: Dimas Chyle, MD  Brief narrative: Lindsey Payne is a 64 y.o. female with a history of hypertension, coronary artery disease, ischemic cardiomyopathy and depression who is admitted for end-stage arthritis of the right hip and underwent a total hip arthroplasty on 11/1. [facility] were consulted for hypotension.   Subjective: The patient has no complaints. Specifically no dizziness, lightheadedness, nausea, vomiting, chest pain, dyspnea or diarrhea.  Assessment/Plan: Principal Problem:   Osteoarthritis of right hip -Per orthopedics  Active Problems:    Hypotension - Will decrease the dose of Toprol today, hold lisinopril, hydralazine, spironolactone, furosemide and isosorbide dinitrate -We'll continue to titrate medications daily based upon blood pressure readings    Chronic systolic heart failure  -2-D echo in 2005 revealed an EF of 35-45% and diffuse LV hypokinesis -Follow I and O and daily weights-diuretics on hold due to hypotension      Code Status:     Code Status Orders        Start     Ordered   05/13/15 1954  Full code   Continuous     05/13/15 1953     Antibiotics: Anti-infectives    Start     Dose/Rate Route Frequency Ordered Stop   05/13/15 1215  ceFAZolin (ANCEF) IVPB 2 g/50 mL premix     2 g 100 mL/hr over 30 Minutes Intravenous To [facility] 05/12/15 1103 05/13/15 1326      Objective: Filed Weights   05/13/15 1046  Weight: 85.73 kg (189 lb)    Intake/Output Summary (Last 24 hours) at 05/14/15 1535 Last data filed at 05/14/15 0900  Gross per 24 hour  Intake   1910 ml  Output    720 ml  Net   1190 ml     Vitals Filed Vitals:   05/14/15 0041 05/14/15 0200 05/14/15 0500 05/14/15 1235  BP: 100/51 101/53 101/56 104/54  Pulse: 77 76 84 78  Temp:  98.8 F (37.1 C) 98 F (36.7 C) 99.6 F (37.6 C)  TempSrc:   Oral Oral   Resp: 16 18 18 18   Weight:      SpO2: 97% 97% 98% 95%    Exam:  General:  Pt is alert, not in acute distress  HEENT: No icterus, No thrush, oral mucosa moist  Cardiovascular: regular rate and rhythm, S1/S2 No murmur  Respiratory: clear to auscultation bilaterally   Abdomen: Soft, +Bowel sounds, non tender, non distended, no guarding  MSK: No LE edema, cyanosis or clubbing  Data Reviewed: Basic Metabolic Panel:  Recent Labs Lab 05/13/15 2329 05/14/15 0113 05/14/15 0608  NA 135 141 134*  K 3.9 4.1 4.3  CL 104 108 104  CO2 23 20* 23  GLUCOSE 122* 111* 112*  BUN 17 18 18   CREATININE 1.79* 1.73* 1.73*  CALCIUM 8.5* 8.9 9.0   Liver Function Tests:  Recent Labs Lab 05/14/15 0113  AST 34  ALT 19  ALKPHOS 60  BILITOT 0.7  PROT 5.7*  ALBUMIN 3.2*   No results for input(s): LIPASE, AMYLASE in the last 168 hours. No results for input(s): AMMONIA in the last 168 hours. CBC:  Recent Labs Lab 05/13/15 2329 05/14/15 0113 05/14/15 0608  WBC 10.4 8.8 8.2  NEUTROABS  --  6.3  --   HGB 10.5* 10.1* 10.3*  HCT 31.6* 31.2* 30.6*  MCV 86.3 85.5 86.0  PLT 202 198 208  Cardiac Enzymes: No results for input(s): CKTOTAL, CKMB, CKMBINDEX, TROPONINI in the last 168 hours. BNP (last 3 results) No results for input(s): BNP in the last 8760 hours.  ProBNP (last 3 results) No results for input(s): PROBNP in the last 8760 hours.  CBG: No results for input(s): GLUCAP in the last 168 hours.  Recent Results (from the past 240 hour(s))  Surgical pcr screen     Status: None   Collection Time: 05/07/15 10:49 AM  Result Value Ref Range Status   MRSA, PCR NEGATIVE NEGATIVE Final   Staphylococcus aureus NEGATIVE NEGATIVE Final    Comment:        The Xpert SA Assay (FDA approved for NASAL specimens in patients over 53 years of age), is one component of a comprehensive surveillance program.  Test performance has been validated by [facility] for patients  greater than or equal to 32 year old. It is not intended to diagnose infection nor to guide or monitor treatment.      Studies: Dg Hip Port Unilat With Pelvis 1v Right  05/13/2015  CLINICAL DATA:  Status post total hip replacement on the right EXAM: DG HIP (WITH OR WITHOUT PELVIS) 2-3V PORT RIGHT COMPARISON:  Intraoperative images obtained earlier in the day FINDINGS: Frontal pelvis as well as frontal and lateral right hip images were obtained. There is a total hip prosthesis on the right with prosthetic components appearing well-seated. No acute fracture or dislocation. Air on the right side is an expected postoperative finding. There is moderate narrowing of the left hip joint. No erosive change. IMPRESSION: Postoperative change on the right. Prosthetic components on the right appear well seated. Narrowing left hip joint. No acute fracture or dislocation. Electronically Signed   By: Lowella Grip III M.D.   On: 05/13/2015 15:40   Dg Hip Operative Unilat With Pelvis Right  05/13/2015  CLINICAL DATA:  Right total hip arthroplasty. EXAM: OPERATIVE RIGHT HIP (WITH PELVIS IF PERFORMED) 2 VIEWS TECHNIQUE: Fluoroscopic spot image(s) were submitted for interpretation post-operatively. COMPARISON:  None. FINDINGS: Two intraoperative views of the right hip are submitted postoperatively for interpretation. Right total hip arthroplasty identified without definite complicating features. IMPRESSION: Right total hip arthroplasty without definite complicating features. Electronically Signed   By: Margarette Canada M.D.   On: 05/13/2015 14:51    Scheduled Meds:  Scheduled Meds: . aspirin EC  325 mg Oral BID PC  . docusate sodium  100 mg Oral BID  . metoprolol succinate  12.5 mg Oral Daily  . PARoxetine  40 mg Oral Daily  . [START ON 05/15/2015] pneumococcal 23 valent vaccine  0.5 mL Intramuscular Tomorrow-1000   Continuous Infusions: . sodium chloride 75 mL/hr at 05/14/15 0548    Time spent on care of this  patient: 30 min   [place], MD 05/14/2015, 3:35 PM  LOS: 1 day   [facility] Office  [phone] Pager - Text Page per [email or web address] If 7PM-7AM, please contact night-coverage [email or web address]

## 2015-05-14 NOTE — Evaluation (Signed)
Physical Therapy Evaluation Patient Details Name: Lindsey Payne MRN: [id number] DOB: [birth year] Today's Date: 05/14/2015   History of Present Illness  Pt is 64 y/o female s/p right total hip arthroplasty. PMH includes CAD,CHF, HTN, and arthritis.  Clinical Impression  Pt is s/p THA resulting in the deficits listed below (see PT Problem List). Min A for sit to stand and to pivot to bedside commode. Ambulation limited to 2' 2* pain. Performed L hip AAROM exercises with min A.  Pt will benefit from skilled PT to increase their independence and safety with mobility to allow discharge to the venue listed below.      Follow Up Recommendations Home health PT;Supervision for mobility/OOB    Equipment Recommendations  3in1 (PT)    Recommendations for Other Services       Precautions / Restrictions Precautions Precautions: Fall Precaution Comments: direct anterior approach Restrictions Weight Bearing Restrictions: Yes RLE Weight Bearing: Weight bearing as tolerated      Mobility  Bed Mobility               General bed mobility comments: pt up in recliner  Transfers Overall transfer level: Needs assistance Equipment used: Rolling walker (2 wheeled) Transfers: Sit to/from [facility] Sit to Stand: Min assist Stand pivot transfers: Min guard       General transfer comment: verbal cues for hand placement, sit to stand x 2 trials, increased time 2* pain  Ambulation/Gait Ambulation/Gait assistance: Min guard Ambulation Distance (Feet): 2 Feet Assistive device: Rolling walker (2 wheeled) Gait Pattern/deviations: Step-to pattern;Decreased step length - right;Decreased step length - left;Decreased weight shift to right;Antalgic     General Gait Details: pt took 3 steps with RW then stated she couldn't continue due to severe pain, pain meds requested  Stairs            Wheelchair Mobility    Modified Rankin (Stroke Patients Only)        Balance Overall balance assessment: Needs assistance Sitting-balance support: No upper extremity supported;Feet supported Sitting balance-Leahy Scale: Good Sitting balance - Comments: seated EOB and washing UB   Standing balance support: Single extremity supported;During functional activity Standing balance-Leahy Scale: Fair Standing balance comment: reliance on RW and steady assist for safety                             Pertinent Vitals/Pain Pain Assessment: 0-10 Pain Score: 6  Pain Location: R hip with activity Pain Descriptors / Indicators: Sore Pain Intervention(s): Limited activity within patient's tolerance;Monitored during session;Patient requesting pain meds-RN notified    Home Living Family/patient expects to be discharged to:: Private residence Living Arrangements: Spouse/significant other Available Help at Discharge: Friend(s);Family;Available 24 hours/day Type of Home: Mobile home Home Access: Stairs to enter Entrance Stairs-Rails: Can reach both Entrance Stairs-Number of Steps: 4 STE Home Layout: One level Home Equipment: Walker - 2 wheels;Shower seat - built in;Cane - single point      Prior Function Level of Independence: Independent with assistive device(s)         Comments: used cane and RW     Hand Dominance   Dominant Hand: Right    Extremity/Trunk Assessment   Upper Extremity Assessment: Generalized weakness           Lower Extremity Assessment: RLE deficits/detail RLE Deficits / Details: sensation intact to light touch, R hip AAROM decr 50% limited by pain    Cervical / Trunk Assessment: Normal  Communication   Communication: No difficulties  Cognition Arousal/Alertness: Awake/alert Behavior During Therapy: WFL for tasks assessed/performed Overall Cognitive Status: Within Functional Limits for tasks assessed                      General Comments      Exercises Total Joint Exercises Ankle Circles/Pumps:  AROM;Both;10 reps;Seated Heel Slides: AAROM;Right;10 reps;Supine Hip ABduction/ADduction: AAROM;Right;10 reps;Supine      Assessment/Plan    PT Assessment Patient needs continued PT services  PT Diagnosis Difficulty walking;Acute pain   PT Problem List Decreased strength;Decreased range of motion;Decreased activity tolerance;Decreased balance;Pain;Decreased mobility  PT Treatment Interventions DME instruction;Gait training;Stair training;Functional mobility training;Therapeutic activities;Patient/family education;Therapeutic exercise   PT Goals (Current goals can be found in the Care Plan section) Acute Rehab PT Goals Patient Stated Goal: to walk better, be able to cook PT Goal Formulation: With patient Time For Goal Achievement: 05/21/15 Potential to Achieve Goals: Good    Frequency 7X/week   Barriers to discharge        Co-evaluation               End of Session Equipment Utilized During Treatment: Gait belt Activity Tolerance: Patient limited by pain Patient left: in chair;with call bell/phone within reach Nurse Communication: Mobility status         Time: 4496-7591 PT Time Calculation (min) (ACUTE ONLY): 17 min   Charges:   PT Evaluation $Initial PT Evaluation Tier I: 1 Procedure     PT G Codes:        Lindsey Payne 05/14/2015, 10:58 AM [phone]

## 2015-05-15 ENCOUNTER — Encounter (HOSPITAL_COMMUNITY): Payer: Self-pay | Admitting: General Practice

## 2015-05-15 DIAGNOSIS — M1611 Unilateral primary osteoarthritis, right hip: Secondary | ICD-10-CM | POA: Diagnosis not present

## 2015-05-15 DIAGNOSIS — I959 Hypotension, unspecified: Secondary | ICD-10-CM

## 2015-05-15 LAB — CBC
HEMATOCRIT: 27.9 % — AB (ref 36.0–46.0)
Hemoglobin: 9.3 g/dL — ABNORMAL LOW (ref 12.0–15.0)
MCH: 28.4 pg (ref 26.0–34.0)
MCHC: 33.3 g/dL (ref 30.0–36.0)
MCV: 85.1 fL (ref 78.0–100.0)
Platelets: 182 10*3/uL (ref 150–400)
RBC: 3.28 MIL/uL — ABNORMAL LOW (ref 3.87–5.11)
RDW: 13.6 % (ref 11.5–15.5)
WBC: 6.8 10*3/uL (ref 4.0–10.5)

## 2015-05-15 NOTE — Progress Notes (Signed)
Occupational Therapy Treatment Patient Details Name: Lindsey Payne MRN: [id number] DOB: 06/23/1951 Today's Date: 05/15/2015    History of present illness Pt is 64 y/o female s/p right total hip arthroplasty- direct anterior approach. PMH includes CAD,CHF, HTN, and arthritis.   OT comments  Patient making progress towards OT goals. OT will continue to follow.  Follow Up Recommendations  No OT follow up;Supervision - Intermittent    Equipment Recommendations  3 in 1 bedside comode    Recommendations for Other Services      Precautions / Restrictions Precautions Precautions: Fall Precaution Comments: direct anterior approach Restrictions Weight Bearing Restrictions: Yes RLE Weight Bearing: Weight bearing as tolerated       Mobility Bed Mobility Overal bed mobility: Needs Assistance Bed Mobility: Sit to Supine       Sit to supine: Min assist   General bed mobility comments: in recliner before and after session  Transfers Overall transfer level: Needs assistance   Transfers: Sit to/from Stand Sit to Stand: Min guard Stand pivot transfers: Min guard       General transfer comment: cues to slide RLE forward before sitting    Balance                                   ADL Overall ADL's : Needs assistance/impaired Eating/Feeding: Independent;Sitting                       Toilet Transfer: Min guard;Ambulation;BSC;RW   Toileting- Water quality scientist and Hygiene: Min guard;Sit to/from stand       Functional mobility during ADLs: Min guard;Rolling walker;Cueing for sequencing General ADL Comments: Patient up in chair upon arrival. Agreeable to OT. Stated she needed to toilet. Min guard A overall with task with use of BSC. Patient reports she took a shower seated on BSC this morning with help from nurse tech. Discussed walk-in shower transfer at home and provided verbal education of the technique. Patient declined practicing at this  time due to pain but stated she did the same technique for stair training earlier with PT. Patient assisted back to bed after toileting at her request. She reports her fiance will assist her as needed with ADLs for now, including LB self-care, and she does not need AE training.      Vision                     Perception     Praxis      Cognition   Behavior During Therapy: WFL for tasks assessed/performed Overall Cognitive Status: Within Functional Limits for tasks assessed                       Extremity/Trunk Assessment               Exercises    Shoulder Instructions       General Comments      Pertinent Vitals/ Pain       Pain Assessment: 0-10 Pain Score: 8  Pain Location: R hip with activity Pain Descriptors / Indicators: Aching;Operative site guarding;Sore;Spasm Pain Intervention(s): Limited activity within patient's tolerance;Monitored during session;Repositioned  Home Living Family/patient expects to be discharged to:: Private residence Living Arrangements: Spouse/significant other  Prior Functioning/Environment              Frequency Min 2X/week     Progress Toward Goals  OT Goals(current goals can now be found in the care plan section)  Progress towards OT goals: Progressing toward goals     Plan Discharge plan remains appropriate    Co-evaluation                 End of Session Equipment Utilized During Treatment: Rolling walker   Activity Tolerance Patient tolerated treatment well   Patient Left in bed;with call bell/phone within reach   Nurse Communication          Time: 1000-1013 OT Time Calculation (min): 13 min  Charges: OT General Charges $OT Visit: 1 Procedure OT Treatments $Self Care/Home Management : 8-22 mins  Webber Michiels A 05/15/2015, 11:36 AM

## 2015-05-15 NOTE — Progress Notes (Signed)
[facility] Consult Note   Lindsey Payne  VOJ:[id number]  DOB: March 05, 1951  DOA: 05/13/2015 PCP: Dimas Chyle, MD  Brief narrative: Lindsey Payne is a 64 y.o. female with a history of hypertension, coronary artery disease, ischemic cardiomyopathy and depression who is admitted for end-stage arthritis of the right hip and underwent a total hip arthroplasty on 11/1. [facility] were consulted for hypotension.   Subjective: Patient has no new complaints. No complaint of nausea, vomiting, constipation, diarrhea, shortness of breath or chest pain.  Assessment/Plan: Principal Problem:   Osteoarthritis of right hip -Per orthopedics  Active Problems:    Hypotension - Have decreased the dose of Toprol -continue to hold lisinopril, hydralazine, spironolactone, furosemide and isosorbide dinitrate -We'll continue to titrate medications daily based upon blood pressure readings    Chronic systolic heart failure  -2-D echo in 2005 revealed an EF of 35-45% and diffuse LV hypokinesis -Follow I and O and daily weights-diuretics on hold due to hypotension -Remains euvolemic- lungs clear on exam  Chronic kidney disease stage III -Stable  Anemia -Mild drop in hemoglobin due to acute blood loss in relation to above-mentioned surgery-no need for blood transfusion at this point      Code Status:     Code Status Orders        Start     Ordered   05/13/15 1954  Full code   Continuous     05/13/15 1953     Antibiotics: Anti-infectives    Start     Dose/Rate Route Frequency Ordered Stop   05/13/15 1215  ceFAZolin (ANCEF) IVPB 2 g/50 mL premix     2 g 100 mL/hr over 30 Minutes Intravenous To ShortStay Surgical 05/12/15 1103 05/13/15 1326      Objective: Filed Weights   05/13/15 1046 05/14/15 1500 05/15/15 0500  Weight: 85.73 kg (189 lb) 87.907 kg (193 lb 12.8 oz) 87.601 kg (193 lb 2 oz)    Intake/Output Summary (Last 24 hours) at 05/15/15 1714 Last data  filed at 05/15/15 1500  Gross per 24 hour  Intake 982.59 ml  Output   2050 ml  Net -1067.41 ml     Vitals Filed Vitals:   05/14/15 2003 05/15/15 0500 05/15/15 0600 05/15/15 1300  BP: 131/63  116/54 107/53  Pulse: 87  82 90  Temp: 98.5 F (36.9 C)  98.3 F (36.8 C) 99.6 F (37.6 C)  TempSrc: Oral     Resp: 18  18 18   Weight:  87.601 kg (193 lb 2 oz)    SpO2: 95%  95% 100%    Exam:  General:  Pt is alert, not in acute distress  HEENT: No icterus, No thrush, oral mucosa moist  Cardiovascular: regular rate and rhythm, S1/S2 No murmur  Respiratory: clear to auscultation bilaterally   Abdomen: Soft, +Bowel sounds, non tender, non distended, no guarding  MSK: No LE edema, cyanosis or clubbing  Data Reviewed: Basic Metabolic Panel:  Recent Labs Lab 05/13/15 2329 05/14/15 0113 05/14/15 0608  NA 135 141 134*  K 3.9 4.1 4.3  CL 104 108 104  CO2 23 20* 23  GLUCOSE 122* 111* 112*  BUN 17 18 18   CREATININE 1.79* 1.73* 1.73*  CALCIUM 8.5* 8.9 9.0   Liver Function Tests:  Recent Labs Lab 05/14/15 0113  AST 34  ALT 19  ALKPHOS 60  BILITOT 0.7  PROT 5.7*  ALBUMIN 3.2*   No results for input(s): LIPASE, AMYLASE in the last 168 hours. No results  for input(s): AMMONIA in the last 168 hours. CBC:  Recent Labs Lab 05/13/15 2329 05/14/15 0113 05/14/15 0608 05/15/15 0532  WBC 10.4 8.8 8.2 6.8  NEUTROABS  --  6.3  --   --   HGB 10.5* 10.1* 10.3* 9.3*  HCT 31.6* 31.2* 30.6* 27.9*  MCV 86.3 85.5 86.0 85.1  PLT 202 198 208 182   Cardiac Enzymes: No results for input(s): CKTOTAL, CKMB, CKMBINDEX, TROPONINI in the last 168 hours. BNP (last 3 results) No results for input(s): BNP in the last 8760 hours.  ProBNP (last 3 results) No results for input(s): PROBNP in the last 8760 hours.  CBG: No results for input(s): GLUCAP in the last 168 hours.  Recent Results (from the past 240 hour(s))  Surgical pcr screen     Status: None   Collection Time: 05/07/15  10:49 AM  Result Value Ref Range Status   MRSA, PCR NEGATIVE NEGATIVE Final   Staphylococcus aureus NEGATIVE NEGATIVE Final    Comment:        The Xpert SA Assay (FDA approved for NASAL specimens in patients over 98 years of age), is one component of a comprehensive surveillance program.  Test performance has been validated by [facility] for patients greater than or equal to 45 year old. It is not intended to diagnose infection nor to guide or monitor treatment.      Studies:   Scheduled Meds:  Scheduled Meds: . aspirin EC  325 mg Oral BID PC  . docusate sodium  100 mg Oral BID  . metoprolol succinate  12.5 mg Oral Daily  . PARoxetine  40 mg Oral Daily   Continuous Infusions: . sodium chloride 20 mL/hr at 05/14/15 1855    Time spent on care of this patient: 64 min   [place], MD 05/15/2015, 5:14 PM  LOS: 2 days   [facility] Office  [phone] Pager - Text Page per [email or web address] If 7PM-7AM, please contact night-coverage [email or web address]

## 2015-05-15 NOTE — Progress Notes (Signed)
Physical Therapy Treatment Patient Details Name: Lindsey Payne MRN: [id number] DOB: 1951-07-09 Today's Date: 2015-06-03    History of Present Illness Pt is 64 y/o female s/p right total hip arthroplasty- direct anterior approach. PMH includes CAD,CHF, HTN, and arthritis.    PT Comments    Pt with very positive attitude, moving well and able to complete stairs this session. Pt encouraged to mobilize throughout the day and continue HEP. Will follow.   Follow Up Recommendations  Home health PT;Supervision for mobility/OOB     Equipment Recommendations       Recommendations for Other Services       Precautions / Restrictions Precautions Precautions: Fall Precaution Comments: direct anterior approach Restrictions RLE Weight Bearing: Weight bearing as tolerated    Mobility  Bed Mobility               General bed mobility comments: in recliner before and after session  Transfers Overall transfer level: Needs assistance   Transfers: Sit to/from Stand Sit to Stand: Min guard Stand pivot transfers: Min guard       General transfer comment: cues for hand placement and controlled descent, pt tends to plop in chair. Cues to slide RLE forward before sitting  Ambulation/Gait Ambulation/Gait assistance: Supervision Ambulation Distance (Feet): 150 Feet Assistive device: Rolling walker (2 wheeled) Gait Pattern/deviations: Step-through pattern;Decreased stride length   Gait velocity interpretation: Below normal speed for age/gender General Gait Details: cues for position in RW   Stairs Stairs: Yes Stairs assistance: Supervision Stair Management: Two rails;Step to pattern;Forwards Number of Stairs: 4 General stair comments: cues for sequence with pt able to complete successfully with 2 rails  Wheelchair Mobility    Modified Rankin (Stroke Patients Only)       Balance                                    Cognition Arousal/Alertness:  Awake/alert Behavior During Therapy: WFL for tasks assessed/performed Overall Cognitive Status: Within Functional Limits for tasks assessed                      Exercises Total Joint Exercises Hip ABduction/ADduction: AAROM;Right;15 reps;Seated Long Arc Quad: AROM;Seated;Right;15 Theatre manager in Standing: AROM;Seated;Right;15 reps    General Comments        Pertinent Vitals/Pain Pain Score: 4  Pain Location: right hip  with activity Pain Descriptors / Indicators: Sore Pain Intervention(s): Limited activity within patient's tolerance;Premedicated before session;Repositioned    Home Living                      Prior Function            PT Goals (current goals can now be found in the care plan section) Progress towards PT goals: Progressing toward goals    Frequency       PT Plan Current plan remains appropriate    Co-evaluation             End of Session   Activity Tolerance: Patient tolerated treatment well Patient left: in chair;with call bell/phone within reach     Time: 0855-0911 PT Time Calculation (min) (ACUTE ONLY): 16 min  Charges:  $Gait Training: 8-22 mins                    G Codes:      Melford Aase 2015-06-03, 10:04 AM Lanetta Inch  [place], [place]

## 2015-05-15 NOTE — Anesthesia Postprocedure Evaluation (Signed)
  Anesthesia Post-op Note  Patient: Lindsey Payne  Procedure(s) Performed: Procedure(s): RIGHT TOTAL HIP ARTHROPLASTY ANTERIOR APPROACH (Right)  Patient Location: PACU  Anesthesia Type:General  Level of Consciousness: awake  Airway and Oxygen Therapy: Patient Spontanous Breathing  Post-op Pain: mild  Post-op Assessment: Post-op Vital signs reviewed LLE Motor Response: Purposeful movement, Responds to commands LLE Sensation: Full sensation RLE Motor Response: Purposeful movement, Responds to commands RLE Sensation: Full sensation, Pain L Sensory Level: S1-Sole of foot, small toes R Sensory Level: S1-Sole of foot, small toes  Post-op Vital Signs: Reviewed  Last Vitals:  Filed Vitals:   05/15/15 0600  BP: 116/54  Pulse: 82  Temp: 36.8 C  Resp: 18    Complications: No apparent anesthesia complications

## 2015-05-15 NOTE — Progress Notes (Signed)
Subjective: 2 Days Post-Op Procedure(s) (LRB): RIGHT TOTAL HIP ARTHROPLASTY ANTERIOR APPROACH (Right) Patient reports pain as mild.  No complaints.  Objective: Vital signs in last 24 hours: Temp:  [98.3 F (36.8 C)-99.6 F (37.6 C)] 98.3 F (36.8 C) (11/03 0600) Pulse Rate:  [78-87] 82 (11/03 0600) Resp:  [18] 18 (11/03 0600) BP: (91-131)/(54-63) 116/54 mmHg (11/03 0600) SpO2:  [95 %] 95 % (11/03 0600) Weight:  [87.601 kg (193 lb 2 oz)-87.907 kg (193 lb 12.8 oz)] 87.601 kg (193 lb 2 oz) (11/03 0500)  Intake/Output from previous day: 11/02 0701 - 11/03 0700 In: 2513.8 [P.O.:1440; I.V.:1073.8] Out: 1200 [Urine:1200] Intake/Output this shift:     Recent Labs  05/13/15 2329 05/14/15 0113 05/14/15 0608 05/15/15 0532  HGB 10.5* 10.1* 10.3* 9.3*    Recent Labs  05/14/15 0608 05/15/15 0532  WBC 8.2 6.8  RBC 3.56* 3.28*  HCT 30.6* 27.9*  PLT 208 182    Recent Labs  05/14/15 0113 05/14/15 0608  NA 141 134*  K 4.1 4.3  CL 108 104  CO2 20* 23  BUN 18 18  CREATININE 1.73* 1.73*  GLUCOSE 111* 112*  CALCIUM 8.9 9.0   No results for input(s): LABPT, INR in the last 72 hours.   Right lower extremity: Sensation intact distally Intact pulses distally Dorsiflexion/Plantar flexion intact Incision: scant drainage Compartment soft  Assessment/Plan: 2 Days Post-Op Procedure(s) (LRB): RIGHT TOTAL HIP ARTHROPLASTY ANTERIOR APPROACH (Right) Up with therapy  Plan discharge to home tomorrow Hgb stable Kenya Kook 05/15/2015, 8:20 AM

## 2015-05-16 MED ORDER — ASPIRIN 325 MG PO TBEC: 325.0000 mg | DELAYED_RELEASE_TABLET | Freq: Two times a day (BID) | ORAL | Status: DC

## 2015-05-16 MED ORDER — METOPROLOL SUCCINATE ER 25 MG PO TB24
25.0000 mg | ORAL_TABLET | Freq: Two times a day (BID) | ORAL | Status: DC
  Filled 2015-05-16: qty 1

## 2015-05-16 MED ORDER — OXYCODONE-ACETAMINOPHEN 5-325 MG PO TABS: 1.0000 | ORAL_TABLET | ORAL | Status: DC | PRN

## 2015-05-16 MED ORDER — FUROSEMIDE 20 MG PO TABS
20.0000 mg | ORAL_TABLET | Freq: Two times a day (BID) | ORAL | Status: DC
  Administered 2015-05-16: 20 mg via ORAL
  Filled 2015-05-16: qty 1

## 2015-05-16 MED ORDER — SPIRONOLACTONE 25 MG PO TABS
25.0000 mg | ORAL_TABLET | Freq: Every day | ORAL | Status: DC
  Filled 2015-05-16: qty 1

## 2015-05-16 NOTE — Progress Notes (Signed)
Physical Therapy Treatment Patient Details Name: Lindsey Payne MRN: [id number] DOB: 1951-05-05 Today's Date: 05/16/2015    History of Present Illness Pt is 64 y/o female s/p right total hip arthroplasty- direct anterior approach. PMH includes CAD,CHF, HTN, and arthritis.    PT Comments    Pt with pain getting up and moving this morning, states she hasn't had any pain medicine since yesterday. Gait limited by pain and RN provided meds during session. Pt able to complete HEP with assist today and tearful during session stating she just wants to go home, RN aware. Will follow.   Follow Up Recommendations  Home health PT;Supervision for mobility/OOB     Equipment Recommendations       Recommendations for Other Services       Precautions / Restrictions Precautions Precautions: Fall Precaution Comments: direct anterior approach Restrictions RLE Weight Bearing: Weight bearing as tolerated    Mobility  Bed Mobility Overal bed mobility: Needs Assistance Bed Mobility: Supine to Sit;Sit to Supine     Supine to sit: Min assist Sit to supine: Min assist   General bed mobility comments: assist for RLE to enter and exit bed. Education for assist of LLE or use of belt to move bed. Pt partially used belt to move RLE but still needed assist to complete transfer  Transfers Overall transfer level: Needs assistance     Sit to Stand: Supervision         General transfer comment: cues for hand placement and to slide RLE out  Ambulation/Gait Ambulation/Gait assistance: Supervision Ambulation Distance (Feet): 40 Feet Assistive device: Rolling walker (2 wheeled) Gait Pattern/deviations: Step-through pattern;Decreased stride length   Gait velocity interpretation: Below normal speed for age/gender General Gait Details: cues for position in RW   Stairs            Wheelchair Mobility    Modified Rankin (Stroke Patients Only)       Balance                                     Cognition Arousal/Alertness: Awake/alert Behavior During Therapy: WFL for tasks assessed/performed Overall Cognitive Status: Within Functional Limits for tasks assessed                      Exercises Total Joint Exercises Hip ABduction/ADduction: AAROM;Right;15 reps;Seated Long Arc Quad: Seated;Right;15 reps;AAROM Marching in Standing: Seated;Right;15 reps;AAROM    General Comments        Pertinent Vitals/Pain Pain Score: 0-No pain Pain Location: pt reports 8/10 right hip with gait but nothing at rest end of session Pain Intervention(s): Repositioned;RN gave pain meds during session;Monitored during session    Home Living                      Prior Function            PT Goals (current goals can now be found in the care plan section) Progress towards PT goals: Progressing toward goals    Frequency       PT Plan Current plan remains appropriate    Co-evaluation             End of Session   Activity Tolerance: Patient tolerated treatment well Patient left: in bed;with call bell/phone within reach     Time: 0723-0752 PT Time Calculation (min) (ACUTE ONLY): 29 min  Charges:  $Gait Training: 8-22  mins $Therapeutic Exercise: 8-22 mins                    G Codes:      Melford Aase 2015-06-11, 7:57 AM Elwyn Reach, [place]

## 2015-05-16 NOTE — Care Management Important Message (Signed)
Important Message  Patient Details  Name: Lindsey Payne MRN: [id number] Date of Birth: 12-Oct-1950   Medicare Important Message Given:  Yes-second notification given    Delorse Lek 05/16/2015, 1:33 PM

## 2015-05-16 NOTE — Discharge Summary (Signed)
Patient ID: Lindsey Payne MRN: [id number] DOB/AGE: [birth year] 64 y.o.  Admit date: 05/13/2015 Discharge date: 05/16/2015  Admission Diagnoses:  Principal Problem:   Osteoarthritis of right hip Active Problems:   Chronic systolic heart failure (HCC)   Status post total replacement of right hip   Hypotension   Discharge Diagnoses:  Same  Past Medical History  Diagnosis Date  . Arthritis   . Asthma   . Depression   . Hyperlipidemia   . Hypertension   . Adenoma of large intestine   . Back pain   . CHF (congestive heart failure) ([place])   . Tubular adenoma   . Hemorrhoids   . Lung collapse 1984    left lung, no problems since then  . Coronary artery disease     Surgeries: Procedure(s): RIGHT TOTAL HIP ARTHROPLASTY ANTERIOR APPROACH on 05/13/2015   Consultants:    Discharged Condition: Improved  Hospital Course: Lindsey Payne is an 64 y.o. female who was admitted 05/13/2015 for operative treatment ofOsteoarthritis of right hip. Patient has severe unremitting pain that affects sleep, daily activities, and work/hobbies. After pre-op clearance the patient was taken to the operating room on 05/13/2015 and underwent  Procedure(s): RIGHT TOTAL HIP ARTHROPLASTY ANTERIOR APPROACH.    Patient was given perioperative antibiotics: Anti-infectives    Start     Dose/Rate Route Frequency Ordered Stop   05/13/15 1215  ceFAZolin (ANCEF) IVPB 2 g/50 mL premix     2 g 100 mL/hr over 30 Minutes Intravenous To ShortStay Surgical 05/12/15 1103 05/13/15 1326       Patient was given sequential compression devices, early ambulation, and chemoprophylaxis to prevent DVT.  Patient benefited maximally from hospital stay and there were no complications.    Recent vital signs: Patient Vitals for the past 24 hrs:  BP Temp Temp src Pulse Resp SpO2  05/16/15 0414 (!) 151/69 mmHg 98.4 F (36.9 C) - 97 18 100 %  05/15/15 2112 140/78 mmHg 98.4 F (36.9 C) Oral 87 18 100 %  05/15/15 1300  (!) 107/53 mmHg 99.6 F (37.6 C) - 90 18 100 %     Recent laboratory studies:  Recent Labs  05/14/15 0113 05/14/15 0608 05/15/15 0532  WBC 8.8 8.2 6.8  HGB 10.1* 10.3* 9.3*  HCT 31.2* 30.6* 27.9*  PLT 198 208 182  NA 141 134*  --   K 4.1 4.3  --   CL 108 104  --   CO2 20* 23  --   BUN 18 18  --   CREATININE 1.73* 1.73*  --   GLUCOSE 111* 112*  --   CALCIUM 8.9 9.0  --      Discharge Medications:     Medication List    STOP taking these medications        ibuprofen 800 MG tablet  Commonly known as:  ADVIL,MOTRIN      TAKE these medications        acetaminophen-codeine 300-30 MG tablet  Commonly known as:  TYLENOL #3  Take 1 tablet by mouth every 8 (eight) hours as needed for moderate pain.     aspirin 325 MG EC tablet  Take 1 tablet (325 mg total) by mouth 2 (two) times daily after a meal.     furosemide 40 MG tablet  Commonly known as:  LASIX  TAKE 1/2 TABLET TWICE A DAY     gabapentin 300 MG capsule  Commonly known as:  NEURONTIN  Take 1 capsule (300 mg total) by  mouth 3 (three) times daily.     hydrALAZINE 50 MG tablet  Commonly known as:  APRESOLINE  Take 50 mg by mouth 2 (two) times daily.     hydroxypropyl methylcellulose / hypromellose 2.5 % ophthalmic solution  Commonly known as:  ISOPTO TEARS / GONIOVISC  Place 1 drop into the left eye 3 (three) times daily.     isosorbide dinitrate 20 MG tablet  Commonly known as:  ISORDIL  TAKE 1 TABLET BY MOUTH TWICE A DAY     lisinopril 20 MG tablet  Commonly known as:  PRINIVIL,ZESTRIL  TAKE 1 TABLET (20 MG TOTAL) BY MOUTH DAILY.     metoprolol succinate 25 MG 24 hr tablet  Commonly known as:  TOPROL-XL  Take 1 tablet (25 mg total) by mouth 2 (two) times daily.     oxyCODONE-acetaminophen 5-325 MG tablet  Commonly known as:  ROXICET  Take 1-2 tablets by mouth every 4 (four) hours as needed.     PARoxetine 40 MG tablet  Commonly known as:  PAXIL  TAKE 1 TABLET (40 MG TOTAL) BY MOUTH EVERY  MORNING.     potassium chloride SA 20 MEQ tablet  Commonly known as:  KLOR-CON M20  Take 1 tablet (20 mEq total) by mouth 2 (two) times daily.     spironolactone 25 MG tablet  Commonly known as:  ALDACTONE  TAKE 1 TABLET (25 MG TOTAL) BY MOUTH DAILY.     traMADol 50 MG tablet  Commonly known as:  ULTRAM  Take 0.5 tablets (25 mg total) by mouth daily as needed. For 1 week when pain severe        Diagnostic Studies: Dg Hip Port Unilat With Pelvis 1v Right  05/13/2015  CLINICAL DATA:  Status post total hip replacement on the right EXAM: DG HIP (WITH OR WITHOUT PELVIS) 2-3V PORT RIGHT COMPARISON:  Intraoperative images obtained earlier in the day FINDINGS: Frontal pelvis as well as frontal and lateral right hip images were obtained. There is a total hip prosthesis on the right with prosthetic components appearing well-seated. No acute fracture or dislocation. Air on the right side is an expected postoperative finding. There is moderate narrowing of the left hip joint. No erosive change. IMPRESSION: Postoperative change on the right. Prosthetic components on the right appear well seated. Narrowing left hip joint. No acute fracture or dislocation. Electronically Signed   By: Lowella Grip III M.D.   On: 05/13/2015 15:40   Dg Hip Operative Unilat With Pelvis Right  05/13/2015  CLINICAL DATA:  Right total hip arthroplasty. EXAM: OPERATIVE RIGHT HIP (WITH PELVIS IF PERFORMED) 2 VIEWS TECHNIQUE: Fluoroscopic spot image(s) were submitted for interpretation post-operatively. COMPARISON:  None. FINDINGS: Two intraoperative views of the right hip are submitted postoperatively for interpretation. Right total hip arthroplasty identified without definite complicating features. IMPRESSION: Right total hip arthroplasty without definite complicating features. Electronically Signed   By: Margarette Canada M.D.   On: 05/13/2015 14:51    Disposition: 01-Home or Self Care      Discharge Instructions    Discharge  patient    Complete by:  As directed            Follow-up Information    Follow up with [facility].   Why:  Someone from [facility] will contact you concerning start date and time for therapy.   Contact information:   [address] [place] [address] [phone]       Follow up  with Mcarthur Rossetti, MD In 2 weeks.   Specialty:  Orthopedic Surgery   Contact information:   [place] [address] [phone]        Signed: Mcarthur Rossetti 05/16/2015, 6:50 AM

## 2015-05-16 NOTE — Discharge Instructions (Signed)

## 2015-05-16 NOTE — Progress Notes (Signed)
Patient ID: Lindsey Payne, female   DOB: 1950/09/27, 64 y.o.   MRN: [id number] Doing well overall.  Can be discharged to home today.

## 2015-05-19 ENCOUNTER — Ambulatory Visit: Payer: Commercial Managed Care - HMO | Admitting: Family Medicine

## 2015-06-02 ENCOUNTER — Other Ambulatory Visit: Payer: Self-pay | Admitting: Family Medicine

## 2015-06-02 NOTE — Telephone Encounter (Signed)
Rx filled.  Algis Greenhouse. Jerline Pain, [place] Resident PGY-2 06/02/2015 8:29 AM

## 2015-06-04 MED ORDER — PAROXETINE HCL 40 MG PO TABS: ORAL_TABLET | ORAL | Status: DC

## 2015-06-04 MED ORDER — SPIRONOLACTONE 25 MG PO TABS: ORAL_TABLET | ORAL | Status: DC

## 2015-06-04 NOTE — Telephone Encounter (Signed)
Needs refill on paraxetine and [place]

## 2015-06-04 NOTE — Telephone Encounter (Signed)
Rx filled.  Algis Greenhouse. Jerline Pain, [facility] Resident PGY-2 06/04/2015 3:08 PM

## 2015-06-16 ENCOUNTER — Ambulatory Visit: Payer: Commercial Managed Care - HMO | Admitting: Family Medicine

## 2015-06-23 ENCOUNTER — Encounter: Payer: Self-pay | Admitting: Family Medicine

## 2015-06-23 ENCOUNTER — Ambulatory Visit (INDEPENDENT_AMBULATORY_CARE_PROVIDER_SITE_OTHER): Payer: Commercial Managed Care - HMO | Admitting: Family Medicine

## 2015-06-23 ENCOUNTER — Other Ambulatory Visit: Payer: Self-pay

## 2015-06-23 ENCOUNTER — Other Ambulatory Visit: Payer: Self-pay | Admitting: Family Medicine

## 2015-06-23 VITALS — BP 139/77 | HR 72 | Temp 98.2°F | Wt 186.0 lb

## 2015-06-23 DIAGNOSIS — E2839 Other primary ovarian failure: Secondary | ICD-10-CM

## 2015-06-23 DIAGNOSIS — R748 Abnormal levels of other serum enzymes: Secondary | ICD-10-CM

## 2015-06-23 DIAGNOSIS — Z114 Encounter for screening for human immunodeficiency virus [HIV]: Secondary | ICD-10-CM

## 2015-06-23 DIAGNOSIS — I10 Essential (primary) hypertension: Secondary | ICD-10-CM

## 2015-06-23 DIAGNOSIS — B1920 Unspecified viral hepatitis C without hepatic coma: Secondary | ICD-10-CM | POA: Diagnosis not present

## 2015-06-23 DIAGNOSIS — Z13820 Encounter for screening for osteoporosis: Secondary | ICD-10-CM | POA: Diagnosis not present

## 2015-06-23 DIAGNOSIS — R7989 Other specified abnormal findings of blood chemistry: Secondary | ICD-10-CM | POA: Insufficient documentation

## 2015-06-23 DIAGNOSIS — Z1231 Encounter for screening mammogram for malignant neoplasm of breast: Secondary | ICD-10-CM

## 2015-06-23 DIAGNOSIS — Z79899 Other long term (current) drug therapy: Secondary | ICD-10-CM | POA: Diagnosis not present

## 2015-06-23 DIAGNOSIS — Z0000 Encounter for general adult medical examination without abnormal findings: Secondary | ICD-10-CM

## 2015-06-23 DIAGNOSIS — I5022 Chronic systolic (congestive) heart failure: Secondary | ICD-10-CM

## 2015-06-23 LAB — LIPID PANEL
CHOL/HDL RATIO: 5.6 ratio — AB (ref <=5.0)
CHOLESTEROL: 263 mg/dL — AB (ref 125–200)
HDL: 47 mg/dL (ref >=46)
LDL Cholesterol: 162 mg/dL — ABNORMAL HIGH (ref <130)
Triglycerides: 268 mg/dL — ABNORMAL HIGH (ref <150)
VLDL: 54 mg/dL — AB (ref <30)

## 2015-06-23 LAB — BASIC METABOLIC PANEL WITH GFR
BUN: 14 mg/dL (ref 7–25)
CO2: 25 mmol/L (ref 20–31)
Calcium: 9.7 mg/dL (ref 8.6–10.4)
Chloride: 104 mmol/L (ref 98–110)
Creat: 0.94 mg/dL (ref 0.50–0.99)
GFR, EST NON AFRICAN AMERICAN: 64 mL/min (ref >=60)
GFR, Est African American: 74 mL/min (ref >=60)
GLUCOSE: 78 mg/dL (ref 65–99)
POTASSIUM: 4.1 mmol/L (ref 3.5–5.3)
Sodium: 140 mmol/L (ref 135–146)

## 2015-06-23 LAB — POCT GLYCOSYLATED HEMOGLOBIN (HGB A1C): Hemoglobin A1C: 5

## 2015-06-23 NOTE — Patient Instructions (Signed)
Thank you for coming to the clinic today. It was nice seeing you.  Your blood pressure looks good today. Continue your current medications.  We will check your cholesterol and blood sugar today. We will also check you for HIV and hepatitis C.  We will order your DEXA screening test. This looks for osteoporosis.  Please come back in 3-6 months, or sooner if you need anything else.  Take care,  Dr Jerline Pain

## 2015-06-23 NOTE — Progress Notes (Signed)
Subjective:  Lindsey Payne is a 64 y.o. female who presents to the [facility] today with a chief complaint of hypertension follow up.   HPI:  Hypertension BP Readings from Last 3 Encounters:  06/23/15 139/77  05/16/15 151/69  05/07/15 134/72   Home BP monitoring-Yes Compliant with medications-yes, without side effects ROS-Denies any CP, HA, SOB, blurry vision, LE edema, transient weakness, orthopnea, PND.   CHF: Currently on lasix, spironolactone, hydralazine, isordil, lisinopril, and toprol-xl. Tolerating well without side effects. No lower extremity edema. Sees Dr Oran Rein. Will be going back in a few months for annual check up.   Healthcare Maintenance Due for lipid panel, A1c, DEXA scan, and zoster vaccine.   ROS: Per HPI  PMH:  The following were reviewed and entered/updated in epic: Past Medical History  Diagnosis Date  . Arthritis   . Asthma   . Depression   . Hyperlipidemia   . Hypertension   . Adenoma of large intestine   . Back pain   . CHF (congestive heart failure) ([place])   . Tubular adenoma   . Hemorrhoids   . Lung collapse 1984    left lung, no problems since then  . Coronary artery disease    Patient Active Problem List   Diagnosis Date Noted  . Elevated serum creatinine 06/23/2015  . Osteoarthritis of right hip 05/13/2015  . Status post total replacement of right hip 05/13/2015  . Lumbar pain with radiation down right leg 10/23/2014  . Ganglion cyst of wrist 10/23/2014  . Breast mass seen on mammogram 03/19/2013  . Rectal bleeding 01/30/2013  . Hemorrhoids 07/10/2012  . Health care maintenance 05/30/2012  . Back pain with R radiculopathy. 12/15/2011  . HERNIATED DISC 12/04/2009  . TOBACCO ABUSE 04/04/2007  . TUBULOVILLOUS ADENOMA, COLON, HX OF 11/17/2006  . Hyperlipidemia 09/08/2006  . ANXIETY 09/08/2006  . DEPRESSIVE DISORDER, NOS 09/08/2006  . HYPERTENSION, BENIGN SYSTEMIC 09/08/2006  . CORONARY, ARTERIOSCLEROSIS 09/08/2006  .  CARDIOMYOPATHY, ISCHEMIC 09/08/2006  . Chronic systolic heart failure ([place]) 09/08/2006   Past Surgical History  Procedure Laterality Date  . Colon surgery  1987    benign tubulovillous adenoma  . Heart stints      Dr. Wynonia Lawman  . Tonsillectomy    . Lung surgery Left     for collasped lung  . Abdominal hysterectomy      has one ovary left  . Back surgery      3 disc removed  . Total hip arthroplasty Right 05/13/2015    Procedure: RIGHT TOTAL HIP ARTHROPLASTY ANTERIOR APPROACH;  Surgeon: Mcarthur Rossetti, MD;  Location: [place] [facility];  Service: Orthopedics;  Laterality: Right;  . Cesarean section      Objective:  Physical Exam: BP 139/77 mmHg  Pulse 72  Temp(Src) 98.2 F (36.8 C) (Oral)  Wt 186 lb (84.369 kg)  Gen: NAD, resting comfortably CV: RRR with no murmurs appreciated Lungs: NWOB, CTAB with no crackles, wheezes, or rhonchi GI: Normal bowel sounds present. Soft, Nontender, Nondistended. MSK: Trace bilateral lower extremity edema Skin: warm, dry Neuro: grossly normal, moves all extremities Psych: Normal affect and thought content  Assessment/Plan:  HYPERTENSION, BENIGN SYSTEMIC Well controlled today. Continue current medications.   Chronic systolic heart failure (HCC) Doing well. No signs of fluid overload today. Will continue current medical therapy. Will see Dr Wynonia Lawman in a few months.   Health care maintenance Will check A1c and lipids today. Will check HIV and HCV screening tests. DEXA scan ordered. Patient  deferred zoster vaccine today.   Elevated serum creatinine Incidentally noted on discharge labs from hospitalization last month. Will recheck today.     Algis Greenhouse. Jerline Pain, [place] [facility] Resident PGY-2 06/23/2015 11:24 AM

## 2015-06-23 NOTE — Assessment & Plan Note (Signed)
Doing well. No signs of fluid overload today. Will continue current medical therapy. Will see Dr Wynonia Lawman in a few months.

## 2015-06-23 NOTE — Assessment & Plan Note (Signed)
Incidentally noted on discharge labs from hospitalization last month. Will recheck today.

## 2015-06-23 NOTE — Assessment & Plan Note (Signed)
Will check A1c and lipids today. Will check HIV and HCV screening tests. DEXA scan ordered. Patient deferred zoster vaccine today.

## 2015-06-23 NOTE — Assessment & Plan Note (Signed)
Well controlled today. Continue current medications 

## 2015-06-24 ENCOUNTER — Encounter: Payer: Self-pay | Admitting: Family Medicine

## 2015-06-24 ENCOUNTER — Other Ambulatory Visit: Payer: Self-pay | Admitting: Family Medicine

## 2015-06-24 LAB — HEPATITIS C ANTIBODY: HCV Ab: NEGATIVE

## 2015-06-24 LAB — HIV ANTIBODY (ROUTINE TESTING W REFLEX): HIV 1&2 Ab, 4th Generation: NONREACTIVE

## 2015-07-25 ENCOUNTER — Encounter: Payer: Self-pay | Admitting: Family Medicine

## 2015-07-25 ENCOUNTER — Ambulatory Visit
Admission: RE | Admit: 2015-07-25 | Discharge: 2015-07-25 | Disposition: A | Discharge status: Home / Self Care | Payer: Commercial Managed Care - HMO | Source: Ambulatory Visit

## 2015-07-25 ENCOUNTER — Other Ambulatory Visit: Payer: Self-pay | Admitting: Family Medicine

## 2015-07-25 ENCOUNTER — Ambulatory Visit
Admission: RE | Admit: 2015-07-25 | Discharge: 2015-07-25 | Disposition: A | Discharge status: Home / Self Care | Payer: Commercial Managed Care - HMO | Source: Ambulatory Visit | Attending: Family Medicine | Admitting: Family Medicine

## 2015-07-25 DIAGNOSIS — E2839 Other primary ovarian failure: Secondary | ICD-10-CM

## 2015-07-25 DIAGNOSIS — Z1231 Encounter for screening mammogram for malignant neoplasm of breast: Secondary | ICD-10-CM

## 2015-07-28 ENCOUNTER — Other Ambulatory Visit: Payer: Self-pay | Admitting: Family Medicine

## 2015-07-28 DIAGNOSIS — R928 Other abnormal and inconclusive findings on diagnostic imaging of breast: Secondary | ICD-10-CM

## 2015-07-29 ENCOUNTER — Ambulatory Visit
Admission: RE | Admit: 2015-07-29 | Discharge: 2015-07-29 | Disposition: A | Discharge status: Home / Self Care | Payer: Commercial Managed Care - HMO | Source: Ambulatory Visit | Attending: *Deleted | Admitting: *Deleted

## 2015-07-29 DIAGNOSIS — R928 Other abnormal and inconclusive findings on diagnostic imaging of breast: Secondary | ICD-10-CM

## 2015-08-26 ENCOUNTER — Other Ambulatory Visit: Payer: Self-pay | Admitting: Family Medicine

## 2015-08-26 NOTE — Telephone Encounter (Signed)
Rx filled.  Algis Greenhouse. Jerline Pain, [place] [facility] Resident PGY-2 08/26/2015 11:37 AM

## 2015-11-13 ENCOUNTER — Other Ambulatory Visit: Payer: Self-pay | Admitting: Family Medicine

## 2015-11-13 NOTE — Telephone Encounter (Signed)
Informed pt of rx refill and scheduled her an apt with you in June.

## 2015-11-13 NOTE — Telephone Encounter (Signed)
Rx filled.  Algis Greenhouse. Jerline Pain, [place] Resident PGY-2 11/13/2015 3:34 PM

## 2015-12-12 ENCOUNTER — Ambulatory Visit (INDEPENDENT_AMBULATORY_CARE_PROVIDER_SITE_OTHER): Payer: Commercial Managed Care - HMO | Admitting: Family Medicine

## 2015-12-12 ENCOUNTER — Encounter: Payer: Self-pay | Admitting: Family Medicine

## 2015-12-12 VITALS — BP 140/82 | HR 65 | Temp 98.2°F | Ht 68.0 in | Wt 202.0 lb

## 2015-12-12 DIAGNOSIS — E785 Hyperlipidemia, unspecified: Secondary | ICD-10-CM | POA: Diagnosis not present

## 2015-12-12 DIAGNOSIS — I10 Essential (primary) hypertension: Secondary | ICD-10-CM | POA: Diagnosis not present

## 2015-12-12 DIAGNOSIS — M67431 Ganglion, right wrist: Secondary | ICD-10-CM | POA: Diagnosis not present

## 2015-12-12 MED ORDER — ASPIRIN 81 MG PO TBEC: 325.0000 mg | DELAYED_RELEASE_TABLET | Freq: Every day | ORAL | Status: DC

## 2015-12-12 NOTE — Patient Instructions (Signed)
We will note make any medication changes today.  Your cyst may come back. If it does please let us know.  Please come back in 3 months to check your cholesterol.  Take care,  Dr Jerline Pain  Ganglion Cyst A ganglion cyst is a noncancerous, fluid-filled lump that occurs near joints or tendons. The ganglion cyst grows out of a joint or the lining of a tendon. It most often develops in the hand or wrist, but it can also develop in the shoulder, elbow, hip, knee, ankle, or foot. The round or oval ganglion cyst can be the size of a pea or larger than a grape. Increased activity may enlarge the size of the cyst because more fluid starts to build up.  CAUSES It is not known what causes a ganglion cyst to grow. However, it may be related to:  Inflammation or irritation around the joint.  An injury.  Repetitive movements or overuse.  Arthritis. RISK FACTORS Risk factors include:  Being a woman.  Being age 43-50. SIGNS AND SYMPTOMS Symptoms may include:   A lump. This most often appears on the hand or wrist, but it can occur in other areas of the body.  Tingling.  Pain.  Numbness.  Muscle weakness.  Weak grip.  Less movement in a joint. DIAGNOSIS Ganglion cysts are most often diagnosed based on a physical exam. Your health care provider will feel the lump and may shine a light alongside it. If it is a ganglion cyst, a light often shines through it. Your health care provider may order an X-ray, ultrasound, or MRI to rule out other conditions. TREATMENT Ganglion cysts usually go away on their own without treatment. If pain or other symptoms are involved, treatment may be needed. Treatment is also needed if the ganglion cyst limits your movement or if it gets infected. Treatment may include:  Wearing a brace or splint on your wrist or finger.  Taking anti-inflammatory medicine.  Draining fluid from the lump with a needle (aspiration).  Injecting a steroid into the  joint.  Surgery to remove the ganglion cyst. HOME CARE INSTRUCTIONS  Do not press on the ganglion cyst, poke it with a needle, or hit it.  Take medicines only as directed by your health care provider.  Wear your brace or splint as directed by your health care provider.  Watch your ganglion cyst for any changes.  Keep all follow-up visits as directed by your health care provider. This is important. SEEK MEDICAL CARE IF:  Your ganglion cyst becomes larger or more painful.  You have increased redness, red streaks, or swelling.  You have pus coming from the lump.  You have weakness or numbness in the affected area.  You have a fever or chills.   This information is not intended to replace advice given to you by your health care provider. Make sure you discuss any questions you have with your health care provider.   Document Released: 06/25/2000 Document Revised: 07/19/2014 Document Reviewed: 12/11/2013 Elsevier Interactive Patient Education [employer].

## 2015-12-12 NOTE — Assessment & Plan Note (Signed)
Aspiration attempted today. See procedure note above. No fluid aspirated. Instructed patient that cyst may not resolve. If so may need referral to orthopedics for removal versus reattempt at aspiration.. Return Precautions reviewed. Follow up as needed.

## 2015-12-12 NOTE — Assessment & Plan Note (Signed)
Not currently on any medications. Should be on statin given history of ischemic heart disease and coronary artery disease. Lipid panel last December with LDL of 162. Discussed importance of statin today, however patient deferred. Will discuss again at follow-up appointment.

## 2015-12-12 NOTE — Assessment & Plan Note (Addendum)
Well-controlled today. Will continue current medications.

## 2015-12-12 NOTE — Progress Notes (Signed)
    Subjective:  Lindsey Payne is a 65 y.o. female who presents to the [facility] today with a chief complaint of hypertension follow up.   HPI:  Hypertension BP Readings from Last 3 Encounters:  12/12/15 140/82  06/23/15 139/77  05/16/15 151/69   Home BP monitoring-Yes Compliant with medications-yes, without side effects ROS-Denies any CP, HA, SOB, blurry vision, LE edema, transient weakness, orthopnea, PND.   Ganglion cyst.  Located on the right volar aspect of wrist. HAs been present for about a year. Was seen by Dr Dianah Field in the past, elected to watch. Says that it has increased in size over the past year. No medications tried. No drainage or pain.   HLD Lipid panel last December showed elevated LDL of 162. Is not currently on any statin. Tries to eat a healthy diet and exercise regularly.  ROS: Per HPI  PMH: Smoking history reviewed.    Objective:  Physical Exam: BP 140/82 mmHg  Pulse 65  Temp(Src) 98.2 F (36.8 C) (Oral)  Ht 5\' 8"  (1.727 m)  Wt 202 lb (91.627 kg)  BMI 30.72 kg/m2  Gen: NAD, resting comfortably CV: RRR with no murmurs appreciated Pulm: NWOB, CTAB with no crackles, wheezes, or rhonchi MSK:  - Right Wrist: 1cm pea size cyst on volar aspect of right wrist. Freely mobile. Nontender. No erythema.  Skin: warm, dry Neuro: grossly normal, moves all extremities Psych: Normal affect and thought content  Aspiration/Injection Procedure Note ROSIO CUTHBERT [id number] May 22, 1951  Procedure: Aspiration of ganglion cyst Indications: Therapeutic.  Procedure Details Consent: Risks of procedure as well as the alternatives and risks of each were explained to the (patient/caregiver).  Consent for procedure obtained. Right volar wrist with prepped with alcohol swab x2. Topical cold spray was used as anesthetic. A 22g needle was inserted into the cyst. No fluid was able to be aspirated. Needle was withdrawn. Minimal blood loss (5cc). Sterile gauze was  applied.   Assessment/Plan:  HYPERTENSION, BENIGN SYSTEMIC Well-controlled today. Will continue current medications.  Ganglion cyst of wrist Aspiration attempted today. See procedure note above. No fluid aspirated. Instructed patient that cyst may not resolve. If so may need referral to orthopedics for removal versus reattempt at aspiration.. Return Precautions reviewed. Follow up as needed.  Hyperlipidemia Not currently on any medications. Should be on statin given history of ischemic heart disease and coronary artery disease. Lipid panel last December with LDL of 162. Discussed importance of statin today, however patient deferred. Will discuss again at follow-up appointment.   Algis Greenhouse. Jerline Pain, [place] [facility] Resident PGY-2 12/12/2015 2:33 PM

## 2016-01-09 ENCOUNTER — Other Ambulatory Visit: Payer: Self-pay | Admitting: Family Medicine

## 2016-01-09 DIAGNOSIS — R921 Mammographic calcification found on diagnostic imaging of breast: Secondary | ICD-10-CM

## 2016-01-14 ENCOUNTER — Other Ambulatory Visit: Payer: Self-pay | Admitting: Family Medicine

## 2016-01-14 DIAGNOSIS — R921 Mammographic calcification found on diagnostic imaging of breast: Secondary | ICD-10-CM

## 2016-01-15 ENCOUNTER — Other Ambulatory Visit: Payer: Self-pay | Admitting: Family Medicine

## 2016-01-16 ENCOUNTER — Ambulatory Visit
Admission: RE | Admit: 2016-01-16 | Discharge: 2016-01-16 | Disposition: A | Discharge status: Home / Self Care | Payer: Commercial Managed Care - HMO | Source: Ambulatory Visit | Attending: Family Medicine | Admitting: Family Medicine

## 2016-01-16 DIAGNOSIS — R921 Mammographic calcification found on diagnostic imaging of breast: Secondary | ICD-10-CM

## 2016-01-27 ENCOUNTER — Encounter: Payer: Self-pay | Admitting: Student

## 2016-01-27 ENCOUNTER — Ambulatory Visit (INDEPENDENT_AMBULATORY_CARE_PROVIDER_SITE_OTHER): Payer: Commercial Managed Care - HMO | Admitting: Student

## 2016-01-27 VITALS — BP 139/68 | HR 65 | Temp 98.3°F | Wt 203.0 lb

## 2016-01-27 DIAGNOSIS — L729 Follicular cyst of the skin and subcutaneous tissue, unspecified: Secondary | ICD-10-CM | POA: Insufficient documentation

## 2016-01-27 NOTE — Progress Notes (Signed)
   Subjective:    Patient ID: Lindsey Payne, female    DOB: 1951-02-19, 65 y.o.   MRN: [id number]   CC: Bump on head  HPI: 65 y/o F presenting for a lump on the left aspect of her head that she noticed 1-2 months ago  Lump on head - she noted the lump 1-2 months ago - she denies any trauma to the area - she feels it is similar to a cyst she has on her right wrist - she wears wigs and weaves usually so she is not sure exactly when it appeared - if it is a cyst, she requests it be drained  Wrist Cyst - Was aspirated previously but has reappeared - she requests it be drained as well  Smoking status reviewed  Review of Systems  Per HPI, else denies recent illness, fever, headache, changes in vision, chest pain, shortness of breath, abdominal pain, N/V/D, weakness    Objective:  BP 139/68 mmHg  Pulse 65  Temp(Src) 98.3 F (36.8 C) (Oral)  Wt 203 lb (92.08 kg) Vitals and nursing note reviewed  General: NAD Cardiac: RRR Respiratory: CTAB, normal effort Extremities: no edema or cyanosis. WWP. Skin: warm and dry, no rashes noted, approximately 1.5 cm diameter cyst on left lateral side of head, approximately 1 cm cyst on volar aspect of right wrist Neuro: alert and oriented   Assessment & Plan:    Subcutaneous cyst Cyst of wrist and on lateral aspect of head. Each were attempted to be aspirated some success of the wrist but none for the cyst on head ( see procedure note) - Post procedure precautions discussed - will refer to dermatology per patient request for cyst on head - will follow as needed      Uriyah Raska A. Lincoln Brigham MD, [place] [facility] Resident PGY-3 Pager [phone]

## 2016-01-27 NOTE — Patient Instructions (Signed)
Follow up as needed You had an aspiration of your wrist cyst today If you have redness or pain around the site, call the office to be re-evaluated A referral was made to dermatology for the cyst on your head. You should expect to heart from them in the next 1-2 weeks If you have questions or concerns, call the office at [phone]

## 2016-01-27 NOTE — Assessment & Plan Note (Signed)
Cyst of wrist and on lateral aspect of head. Each were attempted to be aspirated some success of the wrist but none for the cyst on head ( see procedure note) - Post procedure precautions discussed - will refer to dermatology per patient request for cyst on head - will follow as needed

## 2016-01-27 NOTE — Progress Notes (Signed)
Procedure: Aspiration of ganglion cyst, cyst of head Indications: Therapeutic.  Procedure Details Consent: Risks of procedure as well as the alternatives and risks of each were explained to the pateint and family  Consent for procedure obtained. Right volar wrist was with prepped with alcohol swab x2 followed by povidone iodine. Topical cold spray was used as anesthetic. A 18 g needle was inserted into the cyst. No fluid was able to be aspirated, however after the needle was removed and pressure was applied viscous, clear fluid was expressed. Minimal blood loss (>2cc). Sterile gauze was applied.  The cyst on the left lateral aspect of her head was with prepped with alcohol swab x2 followed by povidone iodine.. Topical cold spray was used as anesthetic. A 18 g needle was inserted into the cyst. No fluid was able to be aspirated. Minimal blood loss (>2cc). Sterile gauze was applied.  Lindsey Payne A. Lincoln Brigham MD, [place] [facility] Resident PGY-3 Pager [phone]

## 2016-02-10 ENCOUNTER — Other Ambulatory Visit: Payer: Self-pay | Admitting: Family Medicine

## 2016-02-10 NOTE — Telephone Encounter (Signed)
Rx filled.  Algis Greenhouse. Jerline Pain, [place] Resident PGY-3 02/10/2016 10:30 AM

## 2016-03-03 ENCOUNTER — Other Ambulatory Visit: Payer: Self-pay | Admitting: Family Medicine

## 2016-03-09 ENCOUNTER — Ambulatory Visit (INDEPENDENT_AMBULATORY_CARE_PROVIDER_SITE_OTHER): Payer: Commercial Managed Care - HMO | Admitting: *Deleted

## 2016-03-09 ENCOUNTER — Encounter: Payer: Self-pay | Admitting: *Deleted

## 2016-03-09 VITALS — BP 114/54 | HR 75 | Temp 98.6°F | Ht 69.0 in | Wt 206.8 lb

## 2016-03-09 DIAGNOSIS — Z0000 Encounter for general adult medical examination without abnormal findings: Secondary | ICD-10-CM

## 2016-03-09 DIAGNOSIS — Z23 Encounter for immunization: Secondary | ICD-10-CM

## 2016-03-09 NOTE — Progress Notes (Signed)
Subjective:   Lindsey Payne is a 65 y.o. female who presents for an Initial Medicare Annual Wellness Visit.   Cardiac Risk Factors include: advanced age (>59men, >70 women);dyslipidemia;hypertension;obesity (BMI >30kg/m2);smoking/ tobacco exposure     Objective:    Today's Vitals   03/09/16 1000  BP: (!) 114/54  Pulse: 75  Temp: 98.6 F (37 C)  TempSrc: Oral  SpO2: 99%  Weight: 206 lb 12.8 oz (93.8 kg)  Height: 5\' 9"  (1.753 m)   Body mass index is 30.54 kg/m.   Current Medications (verified) Outpatient Encounter Prescriptions as of 03/09/2016  Medication Sig  . aspirin 81 MG EC tablet Take 4 tablets (325 mg total) by mouth daily.  . furosemide (LASIX) 40 MG tablet TAKE 1/2 TABLET TWICE A DAY  . hydrALAZINE (APRESOLINE) 50 MG tablet Take 50 mg by mouth 2 (two) times daily.   . isosorbide dinitrate (ISORDIL) 20 MG tablet TAKE 1 TABLET BY MOUTH TWICE A DAY  . lisinopril (PRINIVIL,ZESTRIL) 20 MG tablet TAKE 1 TABLET EVERY DAY  . metoprolol succinate (TOPROL-XL) 25 MG 24 hr tablet TAKE 1 TABLET TWICE DAILY  . PARoxetine (PAXIL) 40 MG tablet TAKE 1 TABLET (40 MG TOTAL) BY MOUTH EVERY MORNING.  Marland Kitchen potassium chloride SA (KLOR-CON M20) 20 MEQ tablet Take 1 tablet (20 mEq total) by mouth 2 (two) times daily.  Marland Kitchen spironolactone (ALDACTONE) 25 MG tablet TAKE 1 TABLET EVERY DAY  . [DISCONTINUED] acetaminophen-codeine (TYLENOL #3) 300-30 MG tablet Take 1 tablet by mouth every 8 (eight) hours as needed for moderate pain.  . [DISCONTINUED] hydroxypropyl methylcellulose / hypromellose (ISOPTO TEARS / GONIOVISC) 2.5 % ophthalmic solution Place 1 drop into the left eye 3 (three) times daily. (Patient not taking: Reported on 05/06/2015)  . [DISCONTINUED] oxyCODONE-acetaminophen (ROXICET) 5-325 MG tablet Take 1-2 tablets by mouth every 4 (four) hours as needed. (Patient not taking: Reported on 03/09/2016)   No facility-administered encounter medications on file as of 03/09/2016.      Allergies (verified) Valium [diazepam]   History: Past Medical History:  Diagnosis Date  . Adenoma of large intestine   . Arthritis   . Asthma   . Back pain   . CHF (congestive heart failure) ([place])   . Coronary artery disease   . Depression   . Hemorrhoids   . Hyperlipidemia   . Hypertension   . Lung collapse 1984   left lung, no problems since then  . Tubular adenoma    Past Surgical History:  Procedure Laterality Date  . ABDOMINAL HYSTERECTOMY     has one ovary left  . BACK SURGERY     3 disc removed  . CESAREAN SECTION    . COLON SURGERY  1987   benign tubulovillous adenoma  . Heart stints     Dr. Wynonia Lawman  . LUNG SURGERY Left    for collasped lung  . TONSILLECTOMY    . TOTAL HIP ARTHROPLASTY Right 05/13/2015   Procedure: RIGHT TOTAL HIP ARTHROPLASTY ANTERIOR APPROACH;  Surgeon: Mcarthur Rossetti, MD;  Location: [facility];  Service: Orthopedics;  Laterality: Right;   Family History  Problem Relation Age of Onset  . Breast cancer Mother   . Heart disease Mother   . Colon cancer Father 90    died  . Diabetes Father   . Heart disease Father   . Cancer Sister 26    Breast  . Hypertension Son    Social History   Occupational History  . disabled  Social History Main Topics  . Smoking status: Current Every Day Smoker    Packs/day: 0.25    Years: 50.00    Types: Cigarettes    Start date: 07/12/1958  . Smokeless tobacco: Never Used     Comment: smoking 0-3 cigs per day  . Alcohol use No  . Drug use: No  . Sexual activity: No    Tobacco Counseling Ready to quit: Yes Counseling given: Yes   Activities of Daily Living In your present state of health, do you have any difficulty performing the following activities: 03/09/2016 05/15/2015  Hearing? N -  Vision? N -  Difficulty concentrating or making decisions? N -  Walking or climbing stairs? N -  Dressing or bathing? N -  Doing errands, shopping? N Y  Conservation officer, nature and eating ? N -  Using the  Toilet? N -  In the past six months, have you accidently leaked urine? N -  Do you have problems with loss of bowel control? N -  Managing your Medications? N -  Managing your Finances? N -  Housekeeping or managing your Housekeeping? N -  Some recent data might be hidden   Home Safety:  My home has a working smoke alarm:  Yes X 2           My home throw rugs have been fastened down to the floor or removed:  Removed  I have non-slip mats in the bathtub and shower:  Yes         All my home's stairs have railings or bannisters: one level home with 4 outside steps with railing         My home's floors, stairs and hallways are free from clutter, wires and cords:  Yes       Immunizations and Health Maintenance Immunization History  Administered Date(s) Administered  . Influenza Split 03/07/2012  . Influenza Whole 05/03/2007, 04/17/2008, 05/21/2008, 03/25/2009  . Influenza-Unspecified 04/06/2013, 04/11/2014, 05/11/2015  . Pneumococcal Polysaccharide-23 07/27/2007, 05/15/2015  . Td 07/12/2001  . Tdap 05/01/2012   Health Maintenance Due  Topic Date Due  . ZOSTAVAX  04/24/2010  . INFLUENZA VACCINE  02/10/2016  Patient will obtain zostavax at local pharmacy Flu vaccine given today  Patient Care Team: Vivi Barrack, MD as PCP - General (Family Medicine) Jacolyn Reedy, MD as Consulting Physician (Cardiology)  Indicate any recent Medical Services you may have received from other than [facility] providers in the past year (date may be approximate).     Assessment:   This is a routine wellness examination for Lindsey Payne.   Hearing/Vision screen  Hearing Screening   Method: Audiometry   125Hz  250Hz  500Hz  1000Hz  2000Hz  3000Hz  4000Hz  6000Hz  8000Hz   Right ear:   40 40 40  40    Left ear:   40 40 40  Fail      Dietary issues and exercise activities discussed: Current Exercise Habits: Home exercise routine, Type of exercise: walking, Time (Minutes): 60, Frequency (Times/Week): 2, Weekly  Exercise (Minutes/Week): 120, Intensity: Moderate, Exercise limited by: orthopedic condition(s);cardiac condition(s) (Back and leg pain)  Goals    . Blood Pressure < 140/90    . LDL CALC < 100    . Quit smoking / using tobacco    . Weight (lb) < 192 lb (87.1 kg)          7% weight loss     Discussed recording consumption intake to assist with desired 7% weight loss. Recommended MyPLate or My  Fitness Pal apps to assist with recording.  Depression Screen PHQ 2/9 Scores 03/09/2016 01/27/2016 12/12/2015 06/23/2015 03/10/2015 11/15/2014 10/23/2014  PHQ - 2 Score 0 0 0 0 3 3 0  PHQ- 9 Score - - - - 6 - -    Fall Risk Fall Risk  03/09/2016 01/27/2016 12/12/2015 06/23/2015 03/10/2015  Falls in the past year? No No No No No  Risk for fall due to : - - - - Medication side effect  Risk for fall due to (comments): - - - - educated and given handout on fall prevention in the home    Cognitive Function: Mini-Cog passed with score 4/5  TUG Test:  Done in 8 seconds. Patient used both hands to push out of chair and to sit back down.  Screening Tests Health Maintenance  Topic Date Due  . ZOSTAVAX  04/24/2010  . INFLUENZA VACCINE  02/10/2016  . PNA vac Low Risk Adult (2 of 2 - PCV13) 05/14/2016  . MAMMOGRAM  07/28/2017  . COLONOSCOPY  03/05/2018  . TETANUS/TDAP  05/01/2022  . DEXA SCAN  Completed  . Hepatitis C Screening  Completed  . HIV Screening  Completed      Plan:     During the course of the visit, Lindsey Payne was educated and counseled about the following appropriate screening and preventive services:   Vaccines to include Pneumoccal, Influenza, Td, Zostavax  Cardiovascular disease screening  Colorectal cancer screening  Bone density screening  Diabetes screening  Mammography/PAP  Nutrition counseling  Smoking cessation counseling  Patient Instructions (the written plan) were given to the patient.    Velora Heckler, RN   03/09/2016   ADDENDUM:  I have reviewed this  visit and discussed with Howell Rucks, RN, BSN, and agree with her documentation.   Algis Greenhouse. Jerline Pain, [place] Resident PGY-3 03/09/2016 4:39 PM

## 2016-03-09 NOTE — Patient Instructions (Signed)
Fat and Cholesterol Restricted Diet Getting too much fat and cholesterol in your diet may cause health problems. Following this diet helps keep your fat and cholesterol at normal levels. This can keep you from getting sick. WHAT TYPES OF FAT SHOULD I CHOOSE?  Choose monosaturated and polyunsaturated fats. These are found in foods such as olive oil, canola oil, flaxseeds, walnuts, almonds, and seeds.  Eat more omega-3 fats. Good choices include salmon, mackerel, sardines, tuna, flaxseed oil, and ground flaxseeds.  Limit saturated fats. These are in animal products such as meats, butter, and cream. They can also be in plant products such as palm oil, palm kernel oil, and coconut oil.   Avoid foods with partially hydrogenated oils in them. These contain trans fats. Examples of foods that have trans fats are stick margarine, some tub margarines, cookies, crackers, and other baked goods. WHAT GENERAL GUIDELINES DO I NEED TO FOLLOW?   Check food labels. Look for the words "trans fat" and "saturated fat."  When preparing a meal:  Fill half of your plate with vegetables and green salads.  Fill one fourth of your plate with whole grains. Look for the word "whole" as the first word in the ingredient list.  Fill one fourth of your plate with lean protein foods.  Limit fruit to two servings a day. Choose fruit instead of juice.  Eat more foods with soluble fiber. Examples of foods with this type of fiber are apples, broccoli, carrots, beans, peas, and barley. Try to get 20-30 g (grams) of fiber per day.  Eat more home-cooked foods. Eat less at restaurants and buffets.  Limit or avoid alcohol.  Limit foods high in starch and sugar.  Limit fried foods.  Cook foods without frying them. Baking, boiling, grilling, and broiling are all great options.  Lose weight if you are overweight. Losing even a small amount of weight can help your overall health. It can also help prevent diseases such as  diabetes and heart disease. WHAT FOODS CAN I EAT? Grains Whole grains, such as whole wheat or whole grain breads, crackers, cereals, and pasta. Unsweetened oatmeal, bulgur, barley, quinoa, or brown rice. Corn or whole wheat flour tortillas. Vegetables Fresh or frozen vegetables (raw, steamed, roasted, or grilled). Green salads. Fruits All fresh, canned (in natural juice), or frozen fruits. Meat and Other Protein Products Ground beef (85% or leaner), grass-fed beef, or beef trimmed of fat. Skinless chicken or [place]. Ground chicken or [place]. Pork trimmed of fat. All fish and seafood. Eggs. Dried beans, peas, or lentils. Unsalted nuts or seeds. Unsalted canned or dry beans. Dairy Low-fat dairy products, such as skim or 1% milk, 2% or reduced-fat cheeses, low-fat ricotta or cottage cheese, or plain low-fat yogurt. Fats and Oils Tub margarines without trans fats. Light or reduced-fat mayonnaise and salad dressings. Avocado. Olive, canola, sesame, or safflower oils. Natural peanut or almond butter (choose ones without added sugar and oil). The items listed above may not be a complete list of recommended foods or beverages. Contact your dietitian for more options. WHAT FOODS ARE NOT RECOMMENDED? Grains White bread. White pasta. White rice. Cornbread. Bagels, pastries, and croissants. Crackers that contain trans fat. Vegetables White potatoes. Corn. Creamed or fried vegetables. Vegetables in a cheese sauce. Fruits Dried fruits. Canned fruit in light or heavy syrup. Fruit juice. Meat and Other Protein Products Fatty cuts of meat. Ribs, chicken wings, bacon, sausage, bologna, salami, chitterlings, fatback, hot dogs, bratwurst, and packaged luncheon meats. Liver and organ meats.  Dairy Whole or 2% milk, cream, half-and-half, and cream cheese. Whole milk cheeses. Whole-fat or sweetened yogurt. Full-fat cheeses. Nondairy creamers and whipped toppings. Processed cheese, cheese spreads, or cheese  curds. Sweets and Desserts Corn syrup, sugars, honey, and molasses. Candy. Jam and jelly. Syrup. Sweetened cereals. Cookies, pies, cakes, donuts, muffins, and ice cream. Fats and Oils Butter, stick margarine, lard, shortening, ghee, or bacon fat. Coconut, palm kernel, or palm oils. Beverages Alcohol. Sweetened drinks (such as sodas, lemonade, and fruit drinks or punches). The items listed above may not be a complete list of foods and beverages to avoid. Contact your dietitian for more information.   This information is not intended to replace advice given to you by your health care provider. Make sure you discuss any questions you have with your health care provider.   Document Released: 12/28/2011 Document Revised: 07/19/2014 Document Reviewed: 09/27/2013 Elsevier Interactive Patient Education 2016 [place] in the Home  Falls can cause injuries. They can happen to people of all ages. There are many things you can do to make your home safe and to help prevent falls.  WHAT CAN I DO ON THE OUTSIDE OF MY HOME?  Regularly fix the edges of walkways and driveways and fix any cracks.  Remove anything that might make you trip as you walk through a door, such as a raised step or threshold.  Trim any bushes or trees on the path to your home.  Use bright outdoor lighting.  Clear any walking paths of anything that might make someone trip, such as rocks or tools.  Regularly check to see if handrails are loose or broken. Make sure that both sides of any steps have handrails.  Any raised decks and porches should have guardrails on the edges.  Have any leaves, snow, or ice cleared regularly.  Use sand or salt on walking paths during winter.  Clean up any spills in your garage right away. This includes oil or grease spills. WHAT CAN I DO IN THE BATHROOM?   Use night lights.  Install grab bars by the toilet and in the tub and shower. Do not use towel bars as grab  bars.  Use non-skid mats or decals in the tub or shower.  If you need to sit down in the shower, use a plastic, non-slip stool.  Keep the floor dry. Clean up any water that spills on the floor as soon as it happens.  Remove soap buildup in the tub or shower regularly.  Attach bath mats securely with double-sided non-slip rug tape.  Do not have throw rugs and other things on the floor that can make you trip. WHAT CAN I DO IN THE BEDROOM?  Use night lights.  Make sure that you have a light by your bed that is easy to reach.  Do not use any sheets or blankets that are too big for your bed. They should not hang down onto the floor.  Have a firm chair that has side arms. You can use this for support while you get dressed.  Do not have throw rugs and other things on the floor that can make you trip. WHAT CAN I DO IN THE KITCHEN?  Clean up any spills right away.  Avoid walking on wet floors.  Keep items that you use a lot in easy-to-reach places.  If you need to reach something above you, use a strong step stool that has a grab bar.  Keep electrical cords out of  the way.  Do not use floor polish or wax that makes floors slippery. If you must use wax, use non-skid floor wax.  Do not have throw rugs and other things on the floor that can make you trip. WHAT CAN I DO WITH MY STAIRS?  Do not leave any items on the stairs.  Make sure that there are handrails on both sides of the stairs and use them. Fix handrails that are broken or loose. Make sure that handrails are as long as the stairways.  Check any carpeting to make sure that it is firmly attached to the stairs. Fix any carpet that is loose or worn.  Avoid having throw rugs at the top or bottom of the stairs. If you do have throw rugs, attach them to the floor with carpet tape.  Make sure that you have a light switch at the top of the stairs and the bottom of the stairs. If you do not have them, ask someone to add them for  you. WHAT ELSE CAN I DO TO HELP PREVENT FALLS?  Wear shoes that:  Do not have high heels.  Have rubber bottoms.  Are comfortable and fit you well.  Are closed at the toe. Do not wear sandals.  If you use a stepladder:  Make sure that it is fully opened. Do not climb a closed stepladder.  Make sure that both sides of the stepladder are locked into place.  Ask someone to hold it for you, if possible.  Clearly mark and make sure that you can see:  Any grab bars or handrails.  First and last steps.  Where the edge of each step is.  Use tools that help you move around (mobility aids) if they are needed. These include:  Canes.  Walkers.  Scooters.  Crutches.  Turn on the lights when you go into a dark area. Replace any light bulbs as soon as they burn out.  Set up your furniture so you have a clear path. Avoid moving your furniture around.  If any of your floors are uneven, fix them.  If there are any pets around you, be aware of where they are.  Review your medicines with your doctor. Some medicines can make you feel dizzy. This can increase your chance of falling. Ask your doctor what other things that you can do to help prevent falls.   This information is not intended to replace advice given to you by your health care provider. Make sure you discuss any questions you have with your health care provider.   Document Released: 04/24/2009 Document Revised: 11/12/2014 Document Reviewed: 08/02/2014 Elsevier Interactive Patient Education 2016 [place] Maintenance, Female Adopting a healthy lifestyle and getting preventive care can go a long way to promote health and wellness. Talk with your health care provider about what schedule of regular examinations is right for you. This is a good chance for you to check in with your provider about disease prevention and staying healthy. In between checkups, there are plenty of things you can do on your own. Experts  have done a lot of research about which lifestyle changes and preventive measures are most likely to keep you healthy. Ask your health care provider for more information. WEIGHT AND DIET  Eat a healthy diet  Be sure to include plenty of vegetables, fruits, low-fat dairy products, and lean protein.  Do not eat a lot of foods high in solid fats, added sugars, or salt.  Get regular exercise.  This is one of the most important things you can do for your health.  Most adults should exercise for at least 150 minutes each week. The exercise should increase your heart rate and make you sweat (moderate-intensity exercise).  Most adults should also do strengthening exercises at least twice a week. This is in addition to the moderate-intensity exercise.  Maintain a healthy weight  Body mass index (BMI) is a measurement that can be used to identify possible weight problems. It estimates body fat based on height and weight. Your health care provider can help determine your BMI and help you achieve or maintain a healthy weight.  For females 62 years of age and older:   A BMI below 18.5 is considered underweight.  A BMI of 18.5 to 24.9 is normal.  A BMI of 25 to 29.9 is considered overweight.  A BMI of 30 and above is considered obese.  Watch levels of cholesterol and blood lipids  You should start having your blood tested for lipids and cholesterol at 66 years of age, then have this test every 5 years.  You may need to have your cholesterol levels checked more often if:  Your lipid or cholesterol levels are high.  You are older than 66 years of age.  You are at high risk for heart disease.  CANCER SCREENING   Lung Cancer  Lung cancer screening is recommended for adults 46-3 years old who are at high risk for lung cancer because of a history of smoking.  A yearly low-dose CT scan of the lungs is recommended for people who:  Currently smoke.  Have quit within the past 15  years.  Have at least a 30-pack-year history of smoking. A pack year is smoking an average of one pack of cigarettes a day for 1 year.  Yearly screening should continue until it has been 15 years since you quit.  Yearly screening should stop if you develop a health problem that would prevent you from having lung cancer treatment.  Breast Cancer  Practice breast self-awareness. This means understanding how your breasts normally appear and feel.  It also means doing regular breast self-exams. Let your health care provider know about any changes, no matter how small.  If you are in your 20s or 30s, you should have a clinical breast exam (CBE) by a health care provider every 1-3 years as part of a regular health exam.  If you are 19 or older, have a CBE every year. Also consider having a breast X-ray (mammogram) every year.  If you have a family history of breast cancer, talk to your health care provider about genetic screening.  If you are at high risk for breast cancer, talk to your health care provider about having an MRI and a mammogram every year.  Breast cancer gene (BRCA) assessment is recommended for women who have family members with BRCA-related cancers. BRCA-related cancers include:  Breast.  Ovarian.  Tubal.  Peritoneal cancers.  Results of the assessment will determine the need for genetic counseling and BRCA1 and BRCA2 testing. Cervical Cancer Your health care provider may recommend that you be screened regularly for cancer of the pelvic organs (ovaries, uterus, and vagina). This screening involves a pelvic examination, including checking for microscopic changes to the surface of your cervix (Pap test). You may be encouraged to have this screening done every 3 years, beginning at age 57.  For women ages 47-65, health care providers may recommend pelvic exams and Pap testing every  3 years, or they may recommend the Pap and pelvic exam, combined with testing for human  papilloma virus (HPV), every 5 years. Some types of HPV increase your risk of cervical cancer. Testing for HPV may also be done on women of any age with unclear Pap test results.  Other health care providers may not recommend any screening for nonpregnant women who are considered low risk for pelvic cancer and who do not have symptoms. Ask your health care provider if a screening pelvic exam is right for you.  If you have had past treatment for cervical cancer or a condition that could lead to cancer, you need Pap tests and screening for cancer for at least 20 years after your treatment. If Pap tests have been discontinued, your risk factors (such as having a new sexual partner) need to be reassessed to determine if screening should resume. Some women have medical problems that increase the chance of getting cervical cancer. In these cases, your health care provider may recommend more frequent screening and Pap tests. Colorectal Cancer  This type of cancer can be detected and often prevented.  Routine colorectal cancer screening usually begins at 66 years of age and continues through 66 years of age.  Your health care provider may recommend screening at an earlier age if you have risk factors for colon cancer.  Your health care provider may also recommend using home test kits to check for hidden blood in the stool.  A small camera at the end of a tube can be used to examine your colon directly (sigmoidoscopy or colonoscopy). This is done to check for the earliest forms of colorectal cancer.  Routine screening usually begins at age 23.  Direct examination of the colon should be repeated every 5-10 years through 66 years of age. However, you may need to be screened more often if early forms of precancerous polyps or small growths are found. Skin Cancer  Check your skin from head to toe regularly.  Tell your health care provider about any new moles or changes in moles, especially if there is a  change in a mole's shape or color.  Also tell your health care provider if you have a mole that is larger than the size of a pencil eraser.  Always use sunscreen. Apply sunscreen liberally and repeatedly throughout the day.  Protect yourself by wearing long sleeves, pants, a wide-brimmed hat, and sunglasses whenever you are outside. HEART DISEASE, DIABETES, AND HIGH BLOOD PRESSURE   High blood pressure causes heart disease and increases the risk of stroke. High blood pressure is more likely to develop in:  People who have blood pressure in the high end of the normal range (130-139/85-89 mm Hg).  People who are overweight or obese.  People who are African American.  If you are 71-65 years of age, have your blood pressure checked every 3-5 years. If you are 69 years of age or older, have your blood pressure checked every year. You should have your blood pressure measured twice--once when you are at a hospital or clinic, and once when you are not at a hospital or clinic. Record the average of the two measurements. To check your blood pressure when you are not at a hospital or clinic, you can use:  An automated blood pressure machine at a pharmacy.  A home blood pressure monitor.  If you are between 68 years and 84 years old, ask your health care provider if you should take aspirin to prevent strokes.  Have regular diabetes screenings. This involves taking a blood sample to check your fasting blood sugar level.  If you are at a normal weight and have a low risk for diabetes, have this test once every three years after 66 years of age.  If you are overweight and have a high risk for diabetes, consider being tested at a younger age or more often. PREVENTING INFECTION  Hepatitis B  If you have a higher risk for hepatitis B, you should be screened for this virus. You are considered at high risk for hepatitis B if:  You were born in a country where hepatitis B is common. Ask your health  care provider which countries are considered high risk.  Your parents were born in a high-risk country, and you have not been immunized against hepatitis B (hepatitis B vaccine).  You have HIV or AIDS.  You use needles to inject street drugs.  You live with someone who has hepatitis B.  You have had sex with someone who has hepatitis B.  You get hemodialysis treatment.  You take certain medicines for conditions, including cancer, organ transplantation, and autoimmune conditions. Hepatitis C  Blood testing is recommended for:  Everyone born from [birth year] through 1966.  Anyone with known risk factors for hepatitis C. Sexually transmitted infections (STIs)  You should be screened for sexually transmitted infections (STIs) including gonorrhea and chlamydia if:  You are sexually active and are younger than 66 years of age.  You are older than 66 years of age and your health care provider tells you that you are at risk for this type of infection.  Your sexual activity has changed since you were last screened and you are at an increased risk for chlamydia or gonorrhea. Ask your health care provider if you are at risk.  If you do not have HIV, but are at risk, it may be recommended that you take a prescription medicine daily to prevent HIV infection. This is called pre-exposure prophylaxis (PrEP). You are considered at risk if:  You are sexually active and do not regularly use condoms or know the HIV status of your partner(s).  You take drugs by injection.  You are sexually active with a partner who has HIV. Talk with your health care provider about whether you are at high risk of being infected with HIV. If you choose to begin PrEP, you should first be tested for HIV. You should then be tested every 3 months for as long as you are taking PrEP.  PREGNANCY   If you are premenopausal and you may become pregnant, ask your health care provider about preconception counseling.  If you may  become pregnant, take 400 to 800 micrograms (mcg) of folic acid every day.  If you want to prevent pregnancy, talk to your health care provider about birth control (contraception). OSTEOPOROSIS AND MENOPAUSE   Osteoporosis is a disease in which the bones lose minerals and strength with aging. This can result in serious bone fractures. Your risk for osteoporosis can be identified using a bone density scan.  If you are 31 years of age or older, or if you are at risk for osteoporosis and fractures, ask your health care provider if you should be screened.  Ask your health care provider whether you should take a calcium or vitamin D supplement to lower your risk for osteoporosis.  Menopause may have certain physical symptoms and risks.  Hormone replacement therapy may reduce some of these symptoms and risks. Talk to  your health care provider about whether hormone replacement therapy is right for you.  HOME CARE INSTRUCTIONS   Schedule regular health, dental, and eye exams.  Stay current with your immunizations.   Do not use any tobacco products including cigarettes, chewing tobacco, or electronic cigarettes.  If you are pregnant, do not drink alcohol.  If you are breastfeeding, limit how much and how often you drink alcohol.  Limit alcohol intake to no more than 1 drink per day for nonpregnant women. One drink equals 12 ounces of beer, 5 ounces of wine, or 1 ounces of hard liquor.  Do not use street drugs.  Do not share needles.  Ask your health care provider for help if you need support or information about quitting drugs.  Tell your health care provider if you often feel depressed.  Tell your health care provider if you have ever been abused or do not feel safe at home.   This information is not intended to replace advice given to you by your health care provider. Make sure you discuss any questions you have with your health care provider.   Document Released: 01/11/2011  Document Revised: 07/19/2014 Document Reviewed: 05/30/2013 Elsevier Interactive Patient Education 2016 [place] Can Quit Smoking If you are ready to quit smoking or are thinking about it, congratulations! You have chosen to help yourself be healthier and live longer! There are lots of different ways to quit smoking. Nicotine gum, nicotine patches, a nicotine inhaler, or nicotine nasal spray can help with physical craving. Hypnosis, support groups, and medicines help break the habit of smoking. TIPS TO GET OFF AND STAY OFF CIGARETTES  Learn to predict your moods. Do not let a bad situation be your excuse to have a cigarette. Some situations in your life might tempt you to have a cigarette.  Ask friends and co-workers not to smoke around you.  Make your home smoke-free.  Never have "just one" cigarette. It leads to wanting another and another. Remind yourself of your decision to quit.  On a card, make a list of your reasons for not smoking. Read it at least the same number of times a day as you have a cigarette. Tell yourself everyday, "I do not want to smoke. I choose not to smoke."  Ask someone at home or work to help you with your plan to quit smoking.  Have something planned after you eat or have a cup of coffee. Take a walk or get other exercise to perk you up. This will help to keep you from overeating.  Try a relaxation exercise to calm you down and decrease your stress. Remember, you may be tense and nervous the first two weeks after you quit. This will pass.  Find new activities to keep your hands busy. Play with a pen, coin, or rubber band. Doodle or draw things on paper.  Brush your teeth right after eating. This will help cut down the craving for the taste of tobacco after meals. You can try mouthwash too.  Try gum, breath mints, or diet candy to keep something in your mouth. IF YOU SMOKE AND WANT TO QUIT:  Do not stock up on cigarettes. Never buy a carton. Wait  until one pack is finished before you buy another.  Never carry cigarettes with you at work or at home.  Keep cigarettes as far away from you as possible. Leave them with someone else.  Never carry matches or a lighter with you.  Ask  yourself, "Do I need this cigarette or is this just a reflex?"  Bet with someone that you can quit. Put cigarette money in a piggy bank every morning. If you smoke, you give up the money. If you do not smoke, by the end of the week, you keep the money.  Keep trying. It takes 21 days to change a habit!  Talk to your doctor about using medicines to help you quit. These include nicotine replacement gum, lozenges, or skin patches.   This information is not intended to replace advice given to you by your health care provider. Make sure you discuss any questions you have with your health care provider.   Document Released: 04/24/2009 Document Revised: 09/20/2011 Document Reviewed: 04/24/2009 Elsevier Interactive Patient Education [employer].

## 2016-04-02 ENCOUNTER — Other Ambulatory Visit: Payer: Self-pay | Admitting: Family Medicine

## 2016-04-12 ENCOUNTER — Emergency Department (HOSPITAL_COMMUNITY): Payer: Commercial Managed Care - HMO

## 2016-04-12 ENCOUNTER — Emergency Department (HOSPITAL_COMMUNITY)
Admission: EM | Admit: 2016-04-12 | Discharge: 2016-04-12 | Disposition: A | Discharge status: Home / Self Care | Payer: Commercial Managed Care - HMO | Attending: Emergency Medicine | Admitting: Emergency Medicine

## 2016-04-12 ENCOUNTER — Encounter (HOSPITAL_COMMUNITY): Payer: Self-pay | Admitting: Emergency Medicine

## 2016-04-12 DIAGNOSIS — I5022 Chronic systolic (congestive) heart failure: Secondary | ICD-10-CM | POA: Insufficient documentation

## 2016-04-12 DIAGNOSIS — I110 Hypertensive heart disease with heart failure: Secondary | ICD-10-CM | POA: Insufficient documentation

## 2016-04-12 DIAGNOSIS — F17210 Nicotine dependence, cigarettes, uncomplicated: Secondary | ICD-10-CM | POA: Diagnosis not present

## 2016-04-12 DIAGNOSIS — S3992XA Unspecified injury of lower back, initial encounter: Secondary | ICD-10-CM | POA: Diagnosis present

## 2016-04-12 DIAGNOSIS — Y939 Activity, unspecified: Secondary | ICD-10-CM | POA: Insufficient documentation

## 2016-04-12 DIAGNOSIS — Y92481 Parking lot as the place of occurrence of the external cause: Secondary | ICD-10-CM | POA: Diagnosis not present

## 2016-04-12 DIAGNOSIS — I2510 Atherosclerotic heart disease of native coronary artery without angina pectoris: Secondary | ICD-10-CM | POA: Insufficient documentation

## 2016-04-12 DIAGNOSIS — S39012A Strain of muscle, fascia and tendon of lower back, initial encounter: Secondary | ICD-10-CM

## 2016-04-12 DIAGNOSIS — Z7982 Long term (current) use of aspirin: Secondary | ICD-10-CM | POA: Diagnosis not present

## 2016-04-12 DIAGNOSIS — J45909 Unspecified asthma, uncomplicated: Secondary | ICD-10-CM | POA: Insufficient documentation

## 2016-04-12 DIAGNOSIS — Y999 Unspecified external cause status: Secondary | ICD-10-CM | POA: Diagnosis not present

## 2016-04-12 DIAGNOSIS — Z96641 Presence of right artificial hip joint: Secondary | ICD-10-CM | POA: Insufficient documentation

## 2016-04-12 MED ORDER — ACETAMINOPHEN 325 MG PO TABS
650.0000 mg | ORAL_TABLET | Freq: Once | ORAL | Status: AC
  Administered 2016-04-12: 650 mg via ORAL
  Filled 2016-04-12: qty 2
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
## 2016-04-12 NOTE — ED Notes (Signed)
Declined W/C at D/C and was escorted to lobby by RN. 
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
## 2016-04-12 NOTE — Discharge Instructions (Signed)
Your xray was negative for any abnormality.  Please read the attached information. Take tylenol for pain. Return to the emergency department immediately if you develop any of the following symptoms: You have numbness, tingling, or weakness in the arms or legs. You develop severe headaches not relieved with medicine. You have severe neck pain, especially tenderness in the middle of the back of your neck. You have changes in bowel or bladder control. There is increasing pain in any area of the body. You have shortness of breath, light-headedness, dizziness, or fainting. You have chest pain. You feel sick to your stomach (nauseous), throw up (vomit), or sweat. You have increasing abdominal discomfort. There is blood in your urine, stool, or vomit. You have pain in your shoulder (shoulder strap areas). You feel your symptoms are getting worse.

## 2016-04-28 ENCOUNTER — Telehealth: Payer: Self-pay | Admitting: Family Medicine

## 2016-04-28 NOTE — Telephone Encounter (Signed)
Pt needs pcp to fax [facility] with the [facility] so pt can start getting Rx's from there. Fax [phone]. Please advise. Thanks! ep

## 2016-04-28 NOTE — Telephone Encounter (Signed)
Have not received any refill request from [facility].  [facility] is already listed as primary pharmacy.  Please advise.  Derl Barrow, RN

## 2016-04-29 NOTE — Telephone Encounter (Signed)
I am not sure what kind of fax the patient needs. Her previous prescriptions have been sent in to [facility].  Algis Greenhouse. Jerline Pain, [place] Resident PGY-3 04/29/2016 9:59 AM

## 2016-04-30 ENCOUNTER — Other Ambulatory Visit: Payer: Self-pay | Admitting: *Deleted

## 2016-04-30 MED ORDER — HYDRALAZINE HCL 50 MG PO TABS: 50.0000 mg | ORAL_TABLET | Freq: Two times a day (BID) | ORAL | 3 refills | Status: DC

## 2016-04-30 MED ORDER — ISOSORBIDE DINITRATE 20 MG PO TABS: ORAL_TABLET | ORAL | 3 refills | Status: DC

## 2016-05-06 ENCOUNTER — Ambulatory Visit (INDEPENDENT_AMBULATORY_CARE_PROVIDER_SITE_OTHER): Payer: Commercial Managed Care - HMO | Admitting: Family Medicine

## 2016-05-06 ENCOUNTER — Encounter: Payer: Self-pay | Admitting: Family Medicine

## 2016-05-06 DIAGNOSIS — M67431 Ganglion, right wrist: Secondary | ICD-10-CM | POA: Diagnosis not present

## 2016-05-06 NOTE — Progress Notes (Signed)
   Subjective:  Lindsey Payne is a 65 y.o. female who presents to the [facility] today with a chief complaint of cyst.   HPI:  Ganglion Cyst On volar aspect of wright wrist. PResent for over a year. Has had two attempted aspirations in the past which did not help. Thinks that it is increasing in size over the past few months. No tenderness. No fevers. No drainage or pain.   ROS: Per HPI  Objective:  Physical Exam: BP 139/74   Pulse 69   Temp 97.7 F (36.5 C) (Oral)   Wt 207 lb (93.9 kg)   SpO2 98%   BMI 30.57 kg/m   Gen: NAD, resting comfortably Pulm: NWOB MSK: - Right Wrist: 2cm cyst on volar aspect of right wrist. Freely mobile. Nontender. No erythema.  Skin: warm, dry Neuro: grossly normal, moves all extremities Psych: Normal affect and thought content  Procedure Note: Aspiration/Injection Procedure Note Lindsey Payne [id number] 06-Jan-1951  Procedure: Aspiration of ganglion cyst Indications: Therapeutic.  Procedure Details Consent: Risks of procedure as well as the alternatives and risks of each were explained to the (patient/caregiver).  Consent for procedure obtained. Right volar wrist with prepped with alcohol swab x2. Topical cold spray was used as anesthetic. 88mL of 1% epinephrine injected at site of cyst. An 18g needle was inserted into the cyst. Approximately 2cc of viscous material was aspirated. Needle was withdrawn. Cyst was aspirated with an addition 3-4cc of viscious material removed. Minimal blood loss (5cc). Sterile gauze was applied. Patient tolerated procedure well without complications.   Assessment/Plan:  Ganglion cyst of wrist Aspiration today with approximately 5-6cc of viscous material removed. Told patient that cyst may not resolve. If not, will refer to orthopedics. Return precautions reviewed. Follow up as needed.   Algis Greenhouse. Jerline Pain, [place] [facility] Resident PGY-3 05/06/2016 5:03 PM

## 2016-05-06 NOTE — Patient Instructions (Signed)
Ganglion Cyst  A ganglion cyst is a noncancerous, fluid-filled lump that occurs near joints or tendons. The ganglion cyst grows out of a joint or the lining of a tendon. It most often develops in the hand or wrist, but it can also develop in the shoulder, elbow, hip, knee, ankle, or foot. The round or oval ganglion cyst can be the size of a pea or larger than a grape. Increased activity may enlarge the size of the cyst because more fluid starts to build up.   CAUSES  It is not known what causes a ganglion cyst to grow. However, it may be related to:  · Inflammation or irritation around the joint.  · An injury.  · Repetitive movements or overuse.  · Arthritis.  RISK FACTORS  Risk factors include:  · Being a woman.  · Being age 20-50.  SIGNS AND SYMPTOMS  Symptoms may include:   · A lump. This most often appears on the hand or wrist, but it can occur in other areas of the body.  · Tingling.  · Pain.  · Numbness.  · Muscle weakness.  · Weak grip.  · Less movement in a joint.  DIAGNOSIS  Ganglion cysts are most often diagnosed based on a physical exam. Your health care provider will feel the lump and may shine a light alongside it. If it is a ganglion cyst, a light often shines through it. Your health care provider may order an X-ray, ultrasound, or MRI to rule out other conditions.  TREATMENT  Ganglion cysts usually go away on their own without treatment. If pain or other symptoms are involved, treatment may be needed. Treatment is also needed if the ganglion cyst limits your movement or if it gets infected. Treatment may include:  · Wearing a brace or splint on your wrist or finger.  · Taking anti-inflammatory medicine.  · Draining fluid from the lump with a needle (aspiration).  · Injecting a steroid into the joint.  · Surgery to remove the ganglion cyst.  HOME CARE INSTRUCTIONS  · Do not press on the ganglion cyst, poke it with a needle, or hit it.  · Take medicines only as directed by your health care  provider.  · Wear your brace or splint as directed by your health care provider.  · Watch your ganglion cyst for any changes.  · Keep all follow-up visits as directed by your health care provider. This is important.  SEEK MEDICAL CARE IF:  · Your ganglion cyst becomes larger or more painful.  · You have increased redness, red streaks, or swelling.  · You have pus coming from the lump.  · You have weakness or numbness in the affected area.  · You have a fever or chills.     This information is not intended to replace advice given to you by your health care provider. Make sure you discuss any questions you have with your health care provider.     Document Released: 06/25/2000 Document Revised: 07/19/2014 Document Reviewed: 12/11/2013  Elsevier Interactive Patient Education ©2016 Elsevier Inc.

## 2016-05-06 NOTE — Assessment & Plan Note (Signed)
Aspiration today with approximately 5-6cc of viscous material removed. Told patient that cyst may not resolve. If not, will refer to orthopedics. Return precautions reviewed. Follow up as needed.

## 2016-05-24 ENCOUNTER — Other Ambulatory Visit: Payer: Self-pay | Admitting: *Deleted

## 2016-05-24 MED ORDER — POTASSIUM CHLORIDE CRYS ER 20 MEQ PO TBCR: 20.0000 meq | EXTENDED_RELEASE_TABLET | Freq: Two times a day (BID) | ORAL | 3 refills | Status: DC

## 2016-05-24 NOTE — Telephone Encounter (Signed)
Refill request for 90 day supply.  Martin, Tamika L, RN  

## 2016-06-07 ENCOUNTER — Telehealth: Payer: Self-pay | Admitting: Family Medicine

## 2016-06-07 DIAGNOSIS — M6740 Ganglion, unspecified site: Secondary | ICD-10-CM

## 2016-06-07 NOTE — Telephone Encounter (Signed)
Cyst is not going away. Would like a referral to the specialist dr Jerline Pain referred to at her last visit.

## 2016-06-15 ENCOUNTER — Other Ambulatory Visit: Payer: Self-pay | Admitting: Family Medicine

## 2016-06-15 DIAGNOSIS — R921 Mammographic calcification found on diagnostic imaging of breast: Secondary | ICD-10-CM

## 2016-06-30 ENCOUNTER — Other Ambulatory Visit: Payer: Self-pay | Admitting: *Deleted

## 2016-06-30 MED ORDER — LISINOPRIL 20 MG PO TABS: 20.0000 mg | ORAL_TABLET | Freq: Every day | ORAL | 1 refills | Status: DC

## 2016-06-30 MED ORDER — ISOSORBIDE DINITRATE 20 MG PO TABS: ORAL_TABLET | ORAL | 3 refills | Status: DC

## 2016-06-30 MED ORDER — FUROSEMIDE 40 MG PO TABS: 20.0000 mg | ORAL_TABLET | Freq: Two times a day (BID) | ORAL | 1 refills | Status: DC

## 2016-06-30 NOTE — Telephone Encounter (Signed)
Appointment scheduled for 07/08/16. Nat Christen, CMA

## 2016-07-01 ENCOUNTER — Other Ambulatory Visit: Payer: Self-pay | Admitting: *Deleted

## 2016-07-01 MED ORDER — METOPROLOL SUCCINATE ER 25 MG PO TB24: 25.0000 mg | ORAL_TABLET | Freq: Two times a day (BID) | ORAL | 2 refills | Status: DC

## 2016-07-01 MED ORDER — POTASSIUM CHLORIDE CRYS ER 20 MEQ PO TBCR: 20.0000 meq | EXTENDED_RELEASE_TABLET | Freq: Two times a day (BID) | ORAL | 3 refills | Status: DC

## 2016-07-01 MED ORDER — SPIRONOLACTONE 25 MG PO TABS: 25.0000 mg | ORAL_TABLET | Freq: Every day | ORAL | 3 refills | Status: DC

## 2016-07-02 ENCOUNTER — Other Ambulatory Visit: Payer: Self-pay | Admitting: *Deleted

## 2016-07-05 MED ORDER — PAROXETINE HCL 40 MG PO TABS: 40.0000 mg | ORAL_TABLET | Freq: Every morning | ORAL | 3 refills | Status: DC

## 2016-07-07 ENCOUNTER — Telehealth: Payer: Self-pay | Admitting: *Deleted

## 2016-07-07 NOTE — Telephone Encounter (Signed)
Received faxed request from [facility] for hydroxyzine PAM CA without sig or qty. This med is not listed on current or historical med list. Please advise.  Hubbard Hartshorn, RN, BSN

## 2016-07-07 NOTE — Telephone Encounter (Signed)
I do not see it on her med list either. It may be a mistake. We can try calling them to see if they meant to send in a request for another medication.  Algis Greenhouse. Jerline Pain, [place] Resident PGY-3 07/07/2016 10:54 AM

## 2016-07-07 NOTE — Telephone Encounter (Signed)
Patient has an appointment on 07/08/16, we will just verify which medications she is taking and what she needs a refill on then. Jazmin Hartsell,CMA

## 2016-07-08 ENCOUNTER — Ambulatory Visit (INDEPENDENT_AMBULATORY_CARE_PROVIDER_SITE_OTHER): Payer: Medicare Other | Admitting: Family Medicine

## 2016-07-08 ENCOUNTER — Encounter: Payer: Self-pay | Admitting: Family Medicine

## 2016-07-08 VITALS — BP 124/64 | HR 83 | Temp 98.3°F | Ht 69.0 in | Wt 203.0 lb

## 2016-07-08 DIAGNOSIS — Z23 Encounter for immunization: Secondary | ICD-10-CM | POA: Diagnosis not present

## 2016-07-08 DIAGNOSIS — I10 Essential (primary) hypertension: Secondary | ICD-10-CM

## 2016-07-08 DIAGNOSIS — F411 Generalized anxiety disorder: Secondary | ICD-10-CM | POA: Diagnosis not present

## 2016-07-08 DIAGNOSIS — R739 Hyperglycemia, unspecified: Secondary | ICD-10-CM

## 2016-07-08 DIAGNOSIS — E785 Hyperlipidemia, unspecified: Secondary | ICD-10-CM

## 2016-07-08 LAB — COMPLETE METABOLIC PANEL WITH GFR
ALT: 20 U/L (ref 6–29)
AST: 18 U/L (ref 10–35)
Albumin: 4.4 g/dL (ref 3.6–5.1)
Alkaline Phosphatase: 76 U/L (ref 33–130)
BUN: 16 mg/dL (ref 7–25)
CHLORIDE: 102 mmol/L (ref 98–110)
CO2: 26 mmol/L (ref 20–31)
CREATININE: 1.18 mg/dL — AB (ref 0.50–0.99)
Calcium: 9.7 mg/dL (ref 8.6–10.4)
GFR, EST AFRICAN AMERICAN: 56 mL/min — AB (ref >=60)
GFR, EST NON AFRICAN AMERICAN: 48 mL/min — AB (ref >=60)
GLUCOSE: 98 mg/dL (ref 65–99)
Potassium: 4.3 mmol/L (ref 3.5–5.3)
SODIUM: 138 mmol/L (ref 135–146)
Total Bilirubin: 0.4 mg/dL (ref 0.2–1.2)
Total Protein: 7 g/dL (ref 6.1–8.1)

## 2016-07-08 LAB — CBC
HEMATOCRIT: 38.4 % (ref 35.0–45.0)
Hemoglobin: 12.8 g/dL (ref 11.7–15.5)
MCH: 28.3 pg (ref 27.0–33.0)
MCHC: 33.3 g/dL (ref 32.0–36.0)
MCV: 84.8 fL (ref 80.0–100.0)
MPV: 10.1 fL (ref 7.5–12.5)
PLATELETS: 299 10*3/uL (ref 140–400)
RBC: 4.53 MIL/uL (ref 3.80–5.10)
RDW: 15.1 % — AB (ref 11.0–15.0)
WBC: 6 10*3/uL (ref 3.8–10.8)

## 2016-07-08 LAB — LIPID PANEL
Cholesterol: 285 mg/dL — ABNORMAL HIGH (ref <200)
HDL: 41 mg/dL — AB (ref >50)
LDL CALC: 189 mg/dL — AB (ref <100)
Total CHOL/HDL Ratio: 7 Ratio — ABNORMAL HIGH (ref <5.0)
Triglycerides: 276 mg/dL — ABNORMAL HIGH (ref <150)
VLDL: 55 mg/dL — ABNORMAL HIGH (ref <30)

## 2016-07-08 LAB — POCT GLYCOSYLATED HEMOGLOBIN (HGB A1C): HEMOGLOBIN A1C: 5.7

## 2016-07-08 MED ORDER — ROSUVASTATIN CALCIUM 20 MG PO TABS: 20.0000 mg | ORAL_TABLET | Freq: Every day | ORAL | 3 refills | Status: DC

## 2016-07-08 NOTE — Progress Notes (Signed)
    Subjective:  Lindsey Payne is a 65 y.o. female who presents to the [facility] today with a chief complaint of HTN.   HPI:  Hypertension BP Readings from Last 3 Encounters:  07/08/16 124/64  05/06/16 139/74  04/12/16 134/74   Home BP monitoring-Yes Compliant with medications-yes, without side effects ROS-Denies any CP, HA, SOB, blurry vision, LE edema, transient weakness, orthopnea, PND.   Anxiety Currently taking paxil 40mg  nightly. Is doing well with this dose. No side effects noted. Says that she has noticed a big improvement with her anxiety while on this. No racing thoughts. No difficulty sleeping. No appetite changes.   HLD Patient with history of ischemic heart disease. She has declined being put on statins in the past.   ROS: Per HPI  PMH: Smoking history reviewed.    Objective:  Physical Exam: BP 124/64   Pulse 83   Temp 98.3 F (36.8 C) (Oral)   Ht 5\' 9"  (1.753 m)   Wt 203 lb (92.1 kg)   SpO2 99%   BMI 29.98 kg/m   Gen: NAD, resting comfortably CV: RRR with no murmurs appreciated Pulm: NWOB, CTAB with no crackles, wheezes, or rhonchi MSK: no edema, cyanosis, or clubbing noted Skin: warm, dry Neuro: grossly normal, moves all extremities Psych: Normal affect and thought content  Results for orders placed or performed in visit on 07/08/16 (from the past 72 hour(s))  POCT glycosylated hemoglobin (Hb A1C)     Status: None   Collection Time: 07/08/16  8:50 AM  Result Value Ref Range   Hemoglobin A1C 5.7    Assessment/Plan:  HYPERTENSION, BENIGN SYSTEMIC At goal. Continue hydralazine, isordil, lisinopril, metoprolol, and spironolactone.   Hyperlipidemia Start crestor today. Check lipid panel. Continue aspirin.   Anxiety state Doing well on paxil. Will continue.    Algis Greenhouse. Jerline Pain, [place] [facility] Resident PGY-3 07/08/2016 9:21 AM

## 2016-07-08 NOTE — Assessment & Plan Note (Signed)
Start crestor today. Check lipid panel. Continue aspirin.

## 2016-07-08 NOTE — Patient Instructions (Signed)
Your blood pressure looks good today.  We will start crestor. Let us know if you have any side effects.  We will check blood work today.  Come back to see me in 3 months.  Take care,  Dr Jerline Pain

## 2016-07-08 NOTE — Assessment & Plan Note (Signed)
Doing well on paxil. Will continue.

## 2016-07-08 NOTE — Assessment & Plan Note (Signed)
At goal. Continue hydralazine, isordil, lisinopril, metoprolol, and spironolactone.

## 2016-07-09 ENCOUNTER — Encounter: Payer: Self-pay | Admitting: Family Medicine

## 2016-07-19 ENCOUNTER — Other Ambulatory Visit: Payer: Self-pay | Admitting: Family Medicine

## 2016-07-19 NOTE — Telephone Encounter (Signed)
Pt is calling because her online pharmacy has been trying to contact us for a refill on one of her medication. She doesn't know which medication this is since they sent her a number for the medication [id number]. Is there a way for [place] to find out which medication they are having issues with. jw

## 2016-07-20 NOTE — Telephone Encounter (Signed)
Spoke with pharmacy and they stated that the request they had for medication was for hydroxyzine.  Informed pharmacist that patient is not on this medication and that I would call patient to clarify.  Patient states that she needs a refill on her hydroxyzine and also her paroxetine.  Pharmacy verified and message sent to provider.  Yaqueline Gutter,CMA

## 2016-07-21 MED ORDER — HYDROXYZINE HCL 25 MG PO TABS: 25.0000 mg | ORAL_TABLET | Freq: Every evening | ORAL | 0 refills | Status: DC | PRN

## 2016-07-21 NOTE — Telephone Encounter (Signed)
Informed pt of rx refill and informed her that her paxil and hydralazine should have a refill on them. Pt voiced understanding and is to call us if there is a problem filling any of those two medications.

## 2016-07-21 NOTE — Telephone Encounter (Signed)
Patient should have refills for hydrALAZINE and paroxetine at the pharmacy already. Rx for hydrOXYZINE sent in.  Algis Greenhouse. Jerline Pain, [place] Resident PGY-3 07/21/2016 9:39 AM

## 2016-07-23 ENCOUNTER — Other Ambulatory Visit: Payer: Self-pay | Admitting: Family Medicine

## 2016-07-23 ENCOUNTER — Ambulatory Visit
Admission: RE | Admit: 2016-07-23 | Discharge: 2016-07-23 | Disposition: A | Discharge status: Home / Self Care | Payer: Medicare Other | Source: Ambulatory Visit | Attending: Family Medicine | Admitting: Family Medicine

## 2016-07-23 DIAGNOSIS — R921 Mammographic calcification found on diagnostic imaging of breast: Secondary | ICD-10-CM

## 2016-07-26 ENCOUNTER — Ambulatory Visit
Admission: RE | Admit: 2016-07-26 | Discharge: 2016-07-26 | Disposition: A | Discharge status: Home / Self Care | Payer: Medicare Other | Source: Ambulatory Visit | Attending: Family Medicine | Admitting: Family Medicine

## 2016-07-26 DIAGNOSIS — R921 Mammographic calcification found on diagnostic imaging of breast: Secondary | ICD-10-CM

## 2016-07-26 DIAGNOSIS — M67431 Ganglion, right wrist: Secondary | ICD-10-CM | POA: Diagnosis not present

## 2016-08-11 ENCOUNTER — Encounter: Payer: Self-pay | Admitting: Family Medicine

## 2016-08-11 NOTE — Progress Notes (Signed)
Received request from orthopedics offive for surgical clearance for ganglion cyst removal. Per ACS calculator, patient has below average risk for complication. Faxed form back giving OK for surgical clearance. Also gave permission to hold aspirin for procedure.  Algis Greenhouse. Jerline Pain, [place] Resident PGY-3 08/11/2016 10:09 AM

## 2016-08-16 DIAGNOSIS — I119 Hypertensive heart disease without heart failure: Secondary | ICD-10-CM | POA: Diagnosis not present

## 2016-08-16 DIAGNOSIS — I2510 Atherosclerotic heart disease of native coronary artery without angina pectoris: Secondary | ICD-10-CM | POA: Diagnosis not present

## 2016-08-16 DIAGNOSIS — Z01810 Encounter for preprocedural cardiovascular examination: Secondary | ICD-10-CM | POA: Diagnosis not present

## 2016-08-16 DIAGNOSIS — E785 Hyperlipidemia, unspecified: Secondary | ICD-10-CM | POA: Diagnosis not present

## 2016-08-24 ENCOUNTER — Other Ambulatory Visit: Payer: Self-pay | Admitting: *Deleted

## 2016-08-24 MED ORDER — HYDRALAZINE HCL 50 MG PO TABS: 50.0000 mg | ORAL_TABLET | Freq: Two times a day (BID) | ORAL | 3 refills | Status: DC

## 2016-09-21 NOTE — H&P (Signed)
Lindsey Payne is an 66 y.o. female.   Chief Complaint: RIGHT WRIST VOLAR GANGLION CYST  HPI: THE PATIENT IS A RIGHT HAND DOMINANT FEMALE WHO HAS HAD A GANGLION CYST ON THE RIGHT VOLAR WRIST THAT HAS BEEN PRESENT FOR SEVERAL MONTHS.  SHE HAS BEEN FOLLOWED IN THE OFFICE FOR EVALUATION AND TREATMENT.  THE PATIENT HAD AN ASPIRATION ON 06/28/16 BUT THE GANGLION CYST RETURNED.  DISCUSSED SURGICAL INTERVENTION TO REMOVE THE CYST. REVIEWED THE SURGICAL PROCEDURE, INCLUDING RISKS VERSUS BENEFITS, AND THE POST-OPERATIVE RECOVERY PROCESS. THE PATIENT IS HERE TODAY FOR SURGERY.  Past Medical History:  Diagnosis Date  . Adenoma of large intestine   . Arthritis   . Asthma   . Back pain   . CHF (congestive heart failure) ([place])   . Coronary artery disease   . Depression   . Hemorrhoids   . Hyperlipidemia   . Hypertension   . Lung collapse 1984   left lung, no problems since then  . Tubular adenoma     Past Surgical History:  Procedure Laterality Date  . ABDOMINAL HYSTERECTOMY     has one ovary left  . BACK SURGERY     3 disc removed  . CESAREAN SECTION    . COLON SURGERY  1987   benign tubulovillous adenoma  . Heart stints     Dr. Wynonia Lawman  . LUNG SURGERY Left    for collasped lung  . TONSILLECTOMY    . TOTAL HIP ARTHROPLASTY Right 05/13/2015   Procedure: RIGHT TOTAL HIP ARTHROPLASTY ANTERIOR APPROACH;  Surgeon: Mcarthur Rossetti, MD;  Location: [facility];  Service: Orthopedics;  Laterality: Right;    Family History  Problem Relation Age of Onset  . Breast cancer Mother   . Heart disease Mother   . Colon cancer Father 72    died  . Diabetes Father   . Heart disease Father   . Cancer Sister 77    Breast  . Hypertension Son    Social History:  reports that she has been smoking Cigarettes.  She started smoking about 58 years ago. She has a 12.50 pack-year smoking history. She has never used smokeless tobacco. She reports that she does not drink alcohol or use  drugs.  Allergies:  Allergies  Allergen Reactions  . Valium [Diazepam] Anxiety    No prescriptions prior to admission.    No results found for this or any previous visit (from the past 48 hour(s)). No results found.  ROS NO RECENT ILLNESSES OR HOSPITALIZATIONS  There were no vitals taken for this visit. Physical Exam  General Appearance:  Alert, cooperative, no distress, appears stated age  Head:  Normocephalic, without obvious abnormality, atraumatic  Eyes:  Pupils equal, conjunctiva/corneas clear,         Throat: Lips, mucosa, and tongue normal; teeth and gums normal  Neck: No visible masses     Lungs:   respirations unlabored  Chest Wall:  No tenderness or deformity  Heart:  Regular rate and rhythm,  Abdomen:   Soft, non-tender,         Extremities: RIGHT WRIST: SKIN INTACT, FINGERS WARM WELL PERFUSED GOOD WRIST AND DIGITAL MOTION  Pulses: 2+ and symmetric  Skin: Skin color, texture, turgor normal, no rashes or lesions     Neurologic: Normal    Assessment RIGHT WRIST VOLAR GANGLION CYST  Plan RIGHT WRIST VOLAR GANGLION CYST EXCISION R/B/A DISCUSSED WITH PT IN OFFICE.  PT VOICED UNDERSTANDING OF PLAN CONSENT SIGNED DAY OF SURGERY PT  SEEN AND EXAMINED PRIOR TO OPERATIVE PROCEDURE/DAY OF SURGERY SITE MARKED. QUESTIONS ANSWERED WILL GO HOME FOLLOWING SURGERY  WE ARE PLANNING SURGERY FOR YOUR UPPER EXTREMITY. THE RISKS AND BENEFITS OF SURGERY INCLUDE BUT NOT LIMITED TO BLEEDING INFECTION, DAMAGE TO NEARBY NERVES ARTERIES TENDONS, FAILURE OF SURGERY TO ACCOMPLISH ITS INTENDED GOALS, PERSISTENT SYMPTOMS AND NEED FOR FURTHER SURGICAL INTERVENTION. WITH THIS IN MIND WE WILL PROCEED. I HAVE DISCUSSED WITH THE PATIENT THE PRE AND POSTOPERATIVE REGIMEN AND THE DOS AND DON'TS. PT VOICED UNDERSTANDING AND INFORMED CONSENT SIGNED. Brynda Peon 09/21/2016, 8:37 AM

## 2016-09-29 DIAGNOSIS — K08199 Complete loss of teeth due to other specified cause, unspecified class: Secondary | ICD-10-CM

## 2016-09-29 HISTORY — DX: Complete loss of teeth due to other specified cause, unspecified class: K08.199

## 2016-10-01 ENCOUNTER — Encounter (HOSPITAL_BASED_OUTPATIENT_CLINIC_OR_DEPARTMENT_OTHER): Payer: Self-pay | Admitting: *Deleted

## 2016-10-01 NOTE — Progress Notes (Signed)
Pt instructed npo pmn 3/28 x paxil, toprol, hydralazine, isordil, potassium w sip of water. To [facility] 3/29 @ 1030.  Needs istat on arrival. Pt aware to hold ASA x 5 days.

## 2016-10-07 ENCOUNTER — Ambulatory Visit (HOSPITAL_BASED_OUTPATIENT_CLINIC_OR_DEPARTMENT_OTHER): Payer: Medicare Other | Admitting: Anesthesiology

## 2016-10-07 ENCOUNTER — Ambulatory Visit (HOSPITAL_BASED_OUTPATIENT_CLINIC_OR_DEPARTMENT_OTHER)
Admission: RE | Admit: 2016-10-07 | Discharge: 2016-10-07 | Disposition: A | Discharge status: Home / Self Care | Payer: Medicare Other | Source: Ambulatory Visit | Attending: Orthopedic Surgery | Admitting: Orthopedic Surgery

## 2016-10-07 ENCOUNTER — Encounter (HOSPITAL_BASED_OUTPATIENT_CLINIC_OR_DEPARTMENT_OTHER): Payer: Self-pay | Admitting: *Deleted

## 2016-10-07 ENCOUNTER — Encounter (HOSPITAL_BASED_OUTPATIENT_CLINIC_OR_DEPARTMENT_OTHER)
Admission: RE | Disposition: A | Discharge status: Home / Self Care | Payer: Self-pay | Source: Ambulatory Visit | Attending: Orthopedic Surgery

## 2016-10-07 DIAGNOSIS — Z888 Allergy status to other drugs, medicaments and biological substances status: Secondary | ICD-10-CM | POA: Diagnosis not present

## 2016-10-07 DIAGNOSIS — Z8249 Family history of ischemic heart disease and other diseases of the circulatory system: Secondary | ICD-10-CM | POA: Insufficient documentation

## 2016-10-07 DIAGNOSIS — I2510 Atherosclerotic heart disease of native coronary artery without angina pectoris: Secondary | ICD-10-CM | POA: Insufficient documentation

## 2016-10-07 DIAGNOSIS — M67431 Ganglion, right wrist: Secondary | ICD-10-CM | POA: Diagnosis not present

## 2016-10-07 DIAGNOSIS — I110 Hypertensive heart disease with heart failure: Secondary | ICD-10-CM | POA: Insufficient documentation

## 2016-10-07 DIAGNOSIS — Z96641 Presence of right artificial hip joint: Secondary | ICD-10-CM | POA: Diagnosis not present

## 2016-10-07 DIAGNOSIS — Z833 Family history of diabetes mellitus: Secondary | ICD-10-CM | POA: Insufficient documentation

## 2016-10-07 DIAGNOSIS — F329 Major depressive disorder, single episode, unspecified: Secondary | ICD-10-CM | POA: Diagnosis not present

## 2016-10-07 DIAGNOSIS — Z9049 Acquired absence of other specified parts of digestive tract: Secondary | ICD-10-CM | POA: Insufficient documentation

## 2016-10-07 DIAGNOSIS — Z86010 Personal history of colonic polyps: Secondary | ICD-10-CM | POA: Insufficient documentation

## 2016-10-07 DIAGNOSIS — F17210 Nicotine dependence, cigarettes, uncomplicated: Secondary | ICD-10-CM | POA: Insufficient documentation

## 2016-10-07 DIAGNOSIS — Z803 Family history of malignant neoplasm of breast: Secondary | ICD-10-CM | POA: Diagnosis not present

## 2016-10-07 DIAGNOSIS — E785 Hyperlipidemia, unspecified: Secondary | ICD-10-CM | POA: Diagnosis not present

## 2016-10-07 DIAGNOSIS — Z800 Family history of malignant neoplasm of digestive organs: Secondary | ICD-10-CM | POA: Diagnosis not present

## 2016-10-07 DIAGNOSIS — M549 Dorsalgia, unspecified: Secondary | ICD-10-CM | POA: Insufficient documentation

## 2016-10-07 DIAGNOSIS — I509 Heart failure, unspecified: Secondary | ICD-10-CM | POA: Insufficient documentation

## 2016-10-07 DIAGNOSIS — K649 Unspecified hemorrhoids: Secondary | ICD-10-CM | POA: Diagnosis not present

## 2016-10-07 DIAGNOSIS — J45909 Unspecified asthma, uncomplicated: Secondary | ICD-10-CM | POA: Insufficient documentation

## 2016-10-07 DIAGNOSIS — M1990 Unspecified osteoarthritis, unspecified site: Secondary | ICD-10-CM | POA: Insufficient documentation

## 2016-10-07 HISTORY — DX: Ganglion, unspecified site: M67.40

## 2016-10-07 HISTORY — DX: Presence of spectacles and contact lenses: Z97.3

## 2016-10-07 HISTORY — PX: GANGLION CYST EXCISION: SHX1691

## 2016-10-07 HISTORY — DX: Complete loss of teeth due to other specified cause, unspecified class: K08.199

## 2016-10-07 HISTORY — DX: Personal history of other medical treatment: Z92.89

## 2016-10-07 LAB — POCT I-STAT, CHEM 8
BUN: 15 mg/dL (ref 6–20)
CALCIUM ION: 1.19 mmol/L (ref 1.15–1.40)
CHLORIDE: 104 mmol/L (ref 101–111)
Creatinine, Ser: 1.3 mg/dL — ABNORMAL HIGH (ref 0.44–1.00)
Glucose, Bld: 92 mg/dL (ref 65–99)
HCT: 41 % (ref 36.0–46.0)
HEMOGLOBIN: 13.9 g/dL (ref 12.0–15.0)
POTASSIUM: 4 mmol/L (ref 3.5–5.1)
SODIUM: 140 mmol/L (ref 135–145)
TCO2: 25 mmol/L (ref 0–100)

## 2016-10-07 SURGERY — EXCISION, GANGLION CYST, WRIST
Anesthesia: General | Laterality: Right

## 2016-10-07 MED ORDER — LIDOCAINE 2% (20 MG/ML) 5 ML SYRINGE
INTRAMUSCULAR | Status: DC | PRN
  Administered 2016-10-07 (×2): 50 mg via INTRAVENOUS

## 2016-10-07 MED ORDER — FENTANYL CITRATE (PF) 100 MCG/2ML IJ SOLN
INTRAMUSCULAR | Status: AC
  Filled 2016-10-07: qty 2

## 2016-10-07 MED ORDER — BUPIVACAINE HCL (PF) 0.25 % IJ SOLN
INTRAMUSCULAR | Status: AC
  Filled 2016-10-07: qty 30

## 2016-10-07 MED ORDER — DEXAMETHASONE SODIUM PHOSPHATE 10 MG/ML IJ SOLN
INTRAMUSCULAR | Status: DC | PRN
  Administered 2016-10-07: 10 mg via INTRAVENOUS

## 2016-10-07 MED ORDER — CHLORHEXIDINE GLUCONATE 4 % EX LIQD
60.0000 mL | Freq: Once | CUTANEOUS | Status: DC
  Filled 2016-10-07: qty 118

## 2016-10-07 MED ORDER — BUPIVACAINE HCL (PF) 0.25 % IJ SOLN
INTRAMUSCULAR | Status: DC | PRN
  Administered 2016-10-07: 9 mL

## 2016-10-07 MED ORDER — ONDANSETRON HCL 4 MG/2ML IJ SOLN
INTRAMUSCULAR | Status: DC | PRN
  Administered 2016-10-07: 4 mg via INTRAVENOUS

## 2016-10-07 MED ORDER — FENTANYL CITRATE (PF) 100 MCG/2ML IJ SOLN
25.0000 ug | INTRAMUSCULAR | Status: DC | PRN
  Filled 2016-10-07: qty 1

## 2016-10-07 MED ORDER — LIDOCAINE HCL 1 % IJ SOLN
INTRAMUSCULAR | Status: AC
  Filled 2016-10-07: qty 20

## 2016-10-07 MED ORDER — ONDANSETRON HCL 4 MG/2ML IJ SOLN
4.0000 mg | Freq: Once | INTRAMUSCULAR | Status: DC | PRN
  Filled 2016-10-07: qty 2

## 2016-10-07 MED ORDER — CEFAZOLIN SODIUM-DEXTROSE 2-4 GM/100ML-% IV SOLN
INTRAVENOUS | Status: AC
  Filled 2016-10-07: qty 100

## 2016-10-07 MED ORDER — PROPOFOL 10 MG/ML IV BOLUS
INTRAVENOUS | Status: DC | PRN
Start: 2016-10-07 — End: 2016-10-07
  Administered 2016-10-07: 180 mg via INTRAVENOUS

## 2016-10-07 MED ORDER — CEFAZOLIN SODIUM-DEXTROSE 2-4 GM/100ML-% IV SOLN
2.0000 g | INTRAVENOUS | Status: AC
  Administered 2016-10-07: 2 g via INTRAVENOUS
  Filled 2016-10-07: qty 100

## 2016-10-07 MED ORDER — OXYCODONE HCL 5 MG PO TABS
5.0000 mg | ORAL_TABLET | Freq: Once | ORAL | Status: DC | PRN
  Filled 2016-10-07: qty 1

## 2016-10-07 MED ORDER — LACTATED RINGERS IV SOLN
INTRAVENOUS | Status: DC
  Administered 2016-10-07: 10:00:00 via INTRAVENOUS
  Filled 2016-10-07: qty 1000

## 2016-10-07 MED ORDER — LIDOCAINE 2% (20 MG/ML) 5 ML SYRINGE
INTRAMUSCULAR | Status: AC
  Filled 2016-10-07: qty 5

## 2016-10-07 MED ORDER — OXYCODONE HCL 5 MG/5ML PO SOLN
5.0000 mg | Freq: Once | ORAL | Status: DC | PRN
  Filled 2016-10-07: qty 5

## 2016-10-07 MED ORDER — HYDROCODONE-ACETAMINOPHEN 5-300 MG PO TABS: 1.0000 | ORAL_TABLET | Freq: Two times a day (BID) | ORAL | 0 refills | Status: DC | PRN

## 2016-10-07 SURGICAL SUPPLY — 45 items
BANDAGE ACE 3X5.8 VEL STRL LF (GAUZE/BANDAGES/DRESSINGS) ×2 IMPLANT
BANDAGE ELASTIC 3 VELCRO ST LF (GAUZE/BANDAGES/DRESSINGS) ×3 IMPLANT
BLADE SURG 15 STRL LF DISP TIS (BLADE) ×1 IMPLANT
BLADE SURG 15 STRL SS (BLADE) ×3
BNDG CMPR 9X4 STRL LF SNTH (GAUZE/BANDAGES/DRESSINGS) ×1
BNDG CONFORM 2 STRL LF (GAUZE/BANDAGES/DRESSINGS) ×2 IMPLANT
BNDG CONFORM 3 STRL LF (GAUZE/BANDAGES/DRESSINGS) ×3 IMPLANT
BNDG ELASTIC 2X5.8 VLCR STR LF (GAUZE/BANDAGES/DRESSINGS) ×2 IMPLANT
BNDG ESMARK 4X9 LF (GAUZE/BANDAGES/DRESSINGS) ×3 IMPLANT
CORDS BIPOLAR (ELECTRODE) ×3 IMPLANT
COVER BACK TABLE 60X90IN (DRAPES) ×3 IMPLANT
CUFF TOURNIQUET SINGLE 18IN (TOURNIQUET CUFF) ×2 IMPLANT
DRAPE EXTREMITY T 121X128X90 (DRAPE) ×3 IMPLANT
DRAPE LG THREE QUARTER DISP (DRAPES) ×3 IMPLANT
DRAPE SURG 17X23 STRL (DRAPES) ×3 IMPLANT
DRSG EMULSION OIL 3X3 NADH (GAUZE/BANDAGES/DRESSINGS) ×2 IMPLANT
GAUZE XEROFORM 1X8 LF (GAUZE/BANDAGES/DRESSINGS) ×3 IMPLANT
GLOVE BIO SURGEON STRL SZ8 (GLOVE) ×3 IMPLANT
GLOVE BIOGEL PI IND STRL 8.5 (GLOVE) ×1 IMPLANT
GLOVE BIOGEL PI INDICATOR 8.5 (GLOVE) ×2
GOWN STRL REUS W/ TWL LRG LVL3 (GOWN DISPOSABLE) ×2 IMPLANT
GOWN STRL REUS W/TWL LRG LVL3 (GOWN DISPOSABLE) ×6
KIT RM TURNOVER CYSTO AR (KITS) ×3 IMPLANT
NDL HYPO 25X1 1.5 SAFETY (NEEDLE) ×2 IMPLANT
NDL SAFETY ECLIPSE 18X1.5 (NEEDLE) IMPLANT
NEEDLE HYPO 18GX1.5 SHARP (NEEDLE)
NEEDLE HYPO 25X1 1.5 SAFETY (NEEDLE) ×6 IMPLANT
NS IRRIG 500ML POUR BTL (IV SOLUTION) ×3 IMPLANT
PACK BASIN DAY SURGERY FS (CUSTOM PROCEDURE TRAY) ×3 IMPLANT
PAD ALCOHOL SWAB (MISCELLANEOUS) ×12 IMPLANT
PAD CAST 3X4 CTTN HI CHSV (CAST SUPPLIES) IMPLANT
PADDING CAST COTTON 3X4 STRL (CAST SUPPLIES) ×3
SPLINT PLASTER CAST XFAST 3X15 (CAST SUPPLIES) IMPLANT
SPLINT PLASTER XTRA FASTSET 3X (CAST SUPPLIES) ×2
SPONGE GAUZE 4X4 12PLY STER LF (GAUZE/BANDAGES/DRESSINGS) ×3 IMPLANT
STOCKINETTE 4X48 STRL (DRAPES) ×3 IMPLANT
SUT PROLENE 4 0 PS 2 18 (SUTURE) ×3 IMPLANT
SYR BULB 3OZ (MISCELLANEOUS) ×3 IMPLANT
SYR CONTROL 10ML LL (SYRINGE) ×6 IMPLANT
TAPE STRIPS DRAPE STRL (GAUZE/BANDAGES/DRESSINGS) ×2 IMPLANT
TOWEL OR 17X24 6PK STRL BLUE (TOWEL DISPOSABLE) ×1 IMPLANT
TRAY DSU PREP LF (CUSTOM PROCEDURE TRAY) ×3 IMPLANT
TUBE CONNECTING 12'X1/4 (SUCTIONS) ×1
TUBE CONNECTING 12X1/4 (SUCTIONS) ×1 IMPLANT
UNDERPAD 30X30 INCONTINENT (UNDERPADS AND DIAPERS) ×3 IMPLANT

## 2016-10-07 NOTE — Op Note (Signed)
NAMEAVALENE, SEALY NO.:  [id number]  MEDICAL RECORD NO.:  [id number]  LOCATION:                               FACILITY:  [facility]  PHYSICIAN:  Linna Hoff IV, M.D.DATE OF BIRTH:  1950-10-06  DATE OF PROCEDURE:  10/07/2016 DATE OF DISCHARGE:  10/07/2016                              OPERATIVE REPORT   PREOPERATIVE DIAGNOSIS:  Right wrist volar carpal ganglion.  POSTOPERATIVE DIAGNOSIS:  Right wrist volar carpal ganglion.  ATTENDING PHYSICIAN:  Linna Hoff, M.D., was scrubbed and present for the entire procedure.  ANESTHESIA:  General via LMA.  ASSISTANT SURGEON:  Gertie Fey, who scrubbed and necessary for the entire procedure.  Helped aid in exposure of the radial artery and dissection, wound closure, and application of splint in a timely and expedited fashion.  SURGICAL PROCEDURE: 1. Right wrist volar carpal ganglion excision. 2. Right volar wrist are radiocarpal arthrotomy, exploration, and     drainage.  SURGICAL INDICATIONS:  Ms. Lutterman is a right-hand-dominant female with a persistent mass over the volar aspect of the wrist.  The patient elected to undergo the above procedure.  Risks, benefits, and alternatives were discussed in detail with the patient.  Signed informed consent was obtained.  Risks include, but not limited to, bleeding, infection; damage to nearby nerves, arteries, or tendons; loss of motion of wrist and digits; incomplete relief of symptoms; and need for further surgical intervention.  DESCRIPTION OF PROCEDURE:  The patient was properly identified in the preoperative holding area, and marked with a permanent marker on the right wrist to indicate the correct operative site.  The patient was brought back to the operating room, placed supine on anesthesia room table.  General anesthesia was administered.  The patient tolerated this well.  A well-padded tourniquet was placed on the right brachium and sealed with 1000  drape.  The right upper extremity was then prepped and draped in normal sterile fashion.  Time-out was called, correct side was identified, and procedure then begun.  Attention then turned to the right wrist.  A longitudinal incision was made directly over the mass and dissection carried down through the skin and subcutaneous tissue. Deep dissection carried down through the fascia where we identified the cyst.  Careful dissection was then carried out proximally to identify the radial artery.  The radial artery was then carefully identified and then protected throughout.  The dissection was then done as the stalk was wrapped around the radial artery.  The stalk was traced all the way down to the radiocarpal joint where it was arising from the radiocarpal joint and the radiocarpal joint arthrotomy was then carried out.  Wound was explored and the mass removed at the stalk.  After the wound was then thoroughly irrigated, the capsule was then cauterized to the base of the stalk at the arthrotomy site.  Once this was carried out, the mass was then removed in its entirety.  The tourniquet deflated. Hemostasis was obtained.  Good perfusion of the hand.  Subcutaneous tissues were then closed with Monocryl and skin closed with running 4-0 Prolene.  Steri-Strips were applied.  Sterile compressive bandages were applied.  The patient was placed in a  well-padded volar splint, taken to recovery room in good condition.  POSTPROCEDURE PLAN:  The patient will be discharged to home, seen back in the office in approximately 2 weeks for wound check, suture removal, write a prescription for short-arm brace and then gradual use and activity.  SPECIMEN:  Volar mass to pathology.     [place] Nakayama, M.D.     FWO/MEDQ  D:  10/07/2016  T:  10/07/2016  Job:  [id number]

## 2016-10-07 NOTE — Anesthesia Procedure Notes (Signed)
Procedure Name: LMA Insertion Date/Time: 10/07/2016 1:08 PM Performed by: Bethena Roys T Pre-anesthesia Checklist: Patient identified, Emergency Drugs available, Suction available and Patient being monitored Patient Re-evaluated:Patient Re-evaluated prior to inductionOxygen Delivery Method: Circle system utilized Preoxygenation: Pre-oxygenation with 100% oxygen Intubation Type: IV induction Ventilation: Mask ventilation without difficulty LMA: LMA inserted LMA Size: 4.0 Number of attempts: 1 Airway Equipment and Method: Bite block Placement Confirmation: positive ETCO2 Tube secured with: Tape Dental Injury: Teeth and Oropharynx as per pre-operative assessment

## 2016-10-07 NOTE — Anesthesia Postprocedure Evaluation (Signed)
Anesthesia Post Note  Patient: Lindsey Payne  Procedure(s) Performed: Procedure(s) (LRB): Right wrist volar ganglion excision (Right)  Anesthesia Type: General       Last Vitals:  Vitals:   10/07/16 1415 10/07/16 1430  BP: (!) 146/85 131/72  Pulse: 75 71  Resp: 13 14  Temp:      Last Pain:  Vitals:   10/07/16 0917  TempSrc: Oral                 Kharter Brew COKER

## 2016-10-07 NOTE — Brief Op Note (Signed)
RIGHT WRIST VOLAR GANGLION EXCISION HOME TODAY F/U IN OFFICE IN 14 DAYS JOB ID # [id number]

## 2016-10-07 NOTE — Anesthesia Postprocedure Evaluation (Signed)
Anesthesia Post Note  Patient: Lindsey Payne  Procedure(s) Performed: Procedure(s) (LRB): Right wrist volar ganglion excision (Right)  Patient location during evaluation: PACU Anesthesia Type: General Level of consciousness: awake, awake and alert and oriented Pain management: pain level controlled Vital Signs Assessment: post-procedure vital signs reviewed and stable Respiratory status: spontaneous breathing, nonlabored ventilation and respiratory function stable Cardiovascular status: blood pressure returned to baseline Anesthetic complications: no       Last Vitals:  Vitals:   10/07/16 1415 10/07/16 1430  BP: (!) 146/85 131/72  Pulse: 75 71  Resp: 13 14  Temp:      Last Pain:  Vitals:   10/07/16 0917  TempSrc: Oral                 Haaris Metallo COKER

## 2016-10-07 NOTE — Discharge Instructions (Signed)
KEEP BANDAGE CLEAN AND DRY CALL OFFICE FOR F/U APPT [phone] IN 14 DAYS KEEP HAND ELEVATED ABOVE HEART OK TO APPLY ICE TO OPERATIVE AREA CONTACT OFFICE IF ANY WORSENING PAIN OR CONCERNS.       HAND SURGERY    HOME CARE INSTRUCTIONS    The following instructions have been prepared to help you care for yourself upon your return home today.  Wound Care:  Keep your hand elevated above the level of your heart. Do not allow it to dangle by your side. Keep the dressing dry and do not remove it unless your doctor advises you to do so. He will usually change it at the time of you post-op visit. Moving your fingers is advised to stimulate circulation but will depend on the site of your surgery. Of course, if you have a splint applied your doctor will advise you about movement.  Activity:  Do not drive or operate machinery today. Rest today and then you may return to your normal activity and work as indicated by your physician.  Diet: Drink liquids today or eat a light diet. You may resume a regular diet tomorrow.  General expectations: Pain for two or three days. Fingers may become slightly swollen.   Unexpected Observations- Call your doctor if any of these occur: Severe pain not relieved by pain medication. Elevated temperature. Dressing soaked with blood. Inability to move fingers. White or bluish color to fingers.  Post Anesthesia Home Care Instructions  Activity: Get plenty of rest for the remainder of the day. A responsible individual must stay with you for 24 hours following the procedure.  For the next 24 hours, DO NOT: -Drive a car -Paediatric nurse -Drink alcoholic beverages -Take any medication unless instructed by your physician -Make any legal decisions or sign important papers.  Meals: Start with liquid foods such as gelatin or soup. Progress to regular foods as tolerated. Avoid greasy, spicy, heavy foods. If nausea and/or vomiting occur, drink only clear liquids  until the nausea and/or vomiting subsides. Call your physician if vomiting continues.  Special Instructions/Symptoms: Your throat may feel dry or sore from the anesthesia or the breathing tube placed in your throat during surgery. If this causes discomfort, gargle with warm salt water. The discomfort should disappear within 24 hours.  If you had a scopolamine patch placed behind your ear for the management of post- operative nausea and/or vomiting:  1. The medication in the patch is effective for 72 hours, after which it should be removed.  Wrap patch in a tissue and discard in the trash. Wash hands thoroughly with soap and water. 2. You may remove the patch earlier than 72 hours if you experience unpleasant side effects which may include dry mouth, dizziness or visual disturbances. 3. Avoid touching the patch. Wash your hands with soap and water after contact with the patch.

## 2016-10-07 NOTE — Anesthesia Preprocedure Evaluation (Signed)
Anesthesia Evaluation  Patient identified by MRN, date of birth, ID band Patient awake    Reviewed: Allergy & Precautions, NPO status , Patient's Chart, lab work & pertinent test results  Airway Mallampati: II  TM Distance: >3 FB Neck ROM: Full    Dental  (+) Dental Advisory Given,    Pulmonary Current Smoker,    breath sounds clear to auscultation       Cardiovascular hypertension,  Rhythm:Regular Rate:Normal     Neuro/Psych    GI/Hepatic   Endo/Other    Renal/GU      Musculoskeletal   Abdominal   Peds  Hematology   Anesthesia Other Findings   Reproductive/Obstetrics                             Anesthesia Physical Anesthesia Plan  ASA: III  Anesthesia Plan: General   Post-op Pain Management:    Induction: Intravenous  Airway Management Planned: LMA  Additional Equipment:   Intra-op Plan:   Post-operative Plan:   Informed Consent: I have reviewed the patients History and Physical, chart, labs and discussed the procedure including the risks, benefits and alternatives for the proposed anesthesia with the patient or authorized representative who has indicated his/her understanding and acceptance.   Dental advisory given  Plan Discussed with: CRNA and Anesthesiologist  Anesthesia Plan Comments:         Anesthesia Quick Evaluation

## 2016-10-07 NOTE — Op Note (Deleted)
  The note originally documented on this encounter has been moved the the encounter in which it belongs.  

## 2016-10-07 NOTE — Transfer of Care (Signed)
Immediate Anesthesia Transfer of Care Note  Patient: Lindsey Payne  Procedure(s) Performed: Procedure(s): Right wrist volar ganglion excision (Right)  Patient Location: PACU  Anesthesia Type:General  Level of Consciousness: awake, alert  and oriented  Airway & Oxygen Therapy: Patient Spontanous Breathing and Patient connected to nasal cannula oxygen  Post-op Assessment: Report given to RN  Post vital signs: Reviewed and stable  Last Vitals: 157/81, 75, 13, 100% Vitals:   10/07/16 0917  BP: 123/68  Pulse: 78  Resp: 16  Temp: 36.4 C    Last Pain:  Vitals:   10/07/16 0917  TempSrc: Oral      Patients Stated Pain Goal: 8 (80/22/33 6122)  Complications: No apparent anesthesia complications

## 2016-10-11 ENCOUNTER — Encounter (HOSPITAL_BASED_OUTPATIENT_CLINIC_OR_DEPARTMENT_OTHER): Payer: Self-pay | Admitting: Orthopedic Surgery

## 2016-10-22 DIAGNOSIS — M67431 Ganglion, right wrist: Secondary | ICD-10-CM | POA: Diagnosis not present

## 2016-12-13 ENCOUNTER — Other Ambulatory Visit: Payer: Self-pay | Admitting: Family Medicine

## 2017-02-10 NOTE — Progress Notes (Deleted)
    Subjective:  Lindsey Payne is a 66 y.o. female who presents to the [facility] today to recheck labs  HPI:   Hypertension BP Readings from Last 3 Encounters:  10/07/16 137/63  07/08/16 124/64  05/06/16 139/74   Home BP monitoring-*** Compliant with medications-***yes ***without side effects ROS-Denies any CP, HA, SOB, blurry vision, LE edema, transient weakness, orthopnea, PND.   Hyperlipidemia: started on statin 12/17 and has goal LDL <70 given history of ischemic cardiomyopathy.  Last LDL 12/17 was 189.   PMH: reviewed Tobacco use: reviewed Medication: reviewed and updated ROS: see HPI   Objective:  Physical Exam: There were no vitals taken for this visit.  Gen: 66yo F in NAD, resting comfortably CV: RRR with no murmurs appreciated Pulm: NWOB, CTAB with no crackles, wheezes, or rhonchi GI: Normal bowel sounds present. Soft, Nontender, Nondistended. MSK: no edema, cyanosis, or clubbing noted Skin: warm, dry Neuro: grossly normal, moves all extremities Psych: Normal affect and thought content  No results found for this or any previous visit (from the past 72 hour(s)).   Assessment/Plan:  No problem-specific Assessment & Plan notes found for this encounter.

## 2017-02-11 ENCOUNTER — Ambulatory Visit: Payer: Medicare Other | Admitting: Family Medicine

## 2017-02-21 ENCOUNTER — Ambulatory Visit (INDEPENDENT_AMBULATORY_CARE_PROVIDER_SITE_OTHER): Payer: Medicare Other | Admitting: Family Medicine

## 2017-02-21 ENCOUNTER — Encounter: Payer: Self-pay | Admitting: Family Medicine

## 2017-02-21 VITALS — BP 125/75 | HR 73 | Temp 97.7°F | Wt 204.8 lb

## 2017-02-21 DIAGNOSIS — Z1211 Encounter for screening for malignant neoplasm of colon: Secondary | ICD-10-CM | POA: Diagnosis not present

## 2017-02-21 DIAGNOSIS — E785 Hyperlipidemia, unspecified: Secondary | ICD-10-CM | POA: Diagnosis not present

## 2017-02-21 NOTE — Assessment & Plan Note (Addendum)
Recently started crestor 76mo ago and reports compliance and no symptoms.  -check LDL -cont crestor 20mg  daily and will adjust dose as needed

## 2017-02-21 NOTE — Progress Notes (Signed)
    Subjective:  Lindsey Payne is a 66 y.o. female who presents to the [facility] today to follow-up for hyperlipidemia  HPI:  HYPERLIPIDEMIA: History of ischemic heart disease, previously refused initiating statin and recently started Crestor 6 month ago and at that time had LDL 189.   Symptoms Chest pain on exertion:  none   Leg claudication:   none Medications (modifying factor): Compliance- yes Right upper quadrant pain- none  Muscle aches- none Duration - years   Timing - continuous    Component Value Date/Time   CHOL 285 (H) 07/08/2016 0858   TRIG 276 (H) 07/08/2016 0858   HDL 41 (L) 07/08/2016 0858   LDLDIRECT 135 (H) 05/01/2012 0918   VLDL 55 (H) 07/08/2016 0858   CHOLHDL 7.0 (H) 07/08/2016 4401    PMH: ischemic heart disease, HLD Tobacco use: 3 cigarettes per day Medication: reviewed and updated ROS: see HPI   Objective:  Physical Exam: BP 125/75 (BP Location: Left Arm, Patient Position: Sitting, Cuff Size: Normal)   Pulse 73   Temp 97.7 F (36.5 C) (Oral)   Wt 204 lb 12.8 oz (92.9 kg)   SpO2 98%   BMI 30.24 kg/m   Gen: 65yo F in NAD, resting comfortably CV: RRR with no murmurs appreciated Pulm: NWOB, CTAB with no crackles, wheezes, or rhonchi GI: Normal bowel sounds present. Soft, Nontender, Nondistended. MSK: no edema, cyanosis, or clubbing noted Skin: warm, dry Neuro: grossly normal, moves all extremities Psych: Normal affect and thought content  No results found for this or any previous visit (from the past 72 hour(s)).   Assessment/Plan:  Hyperlipidemia Recently started crestor 57mo ago and reports compliance and no symptoms.  -check LDL -cont crestor 20mg  daily and will adjust dose as needed

## 2017-02-21 NOTE — Patient Instructions (Addendum)
Bexlee, you were seen today to follow up on your cholesterol after starting crestor 31mo ago.  I am glad that you're not having any symptoms and that you've been taking the medication daily.  I am checking her cholesterol today and I will follow-up with you and let you know if we need to adjust the dose.   Please follow-up with me in 2 months to discuss quitting smoking.   Very nice meeting you today,  Cherly Anderson. Rosalyn Gess, [facility] Resident PGY-2 02/21/2017 9:01 AM

## 2017-02-22 ENCOUNTER — Encounter: Payer: Self-pay | Admitting: Family Medicine

## 2017-02-22 LAB — LDL CHOLESTEROL, DIRECT: LDL DIRECT: 74 mg/dL (ref 0–99)

## 2017-03-18 ENCOUNTER — Other Ambulatory Visit: Payer: Self-pay | Admitting: Family Medicine

## 2017-03-20 DIAGNOSIS — H2513 Age-related nuclear cataract, bilateral: Secondary | ICD-10-CM | POA: Diagnosis not present

## 2017-04-18 ENCOUNTER — Other Ambulatory Visit: Payer: Self-pay | Admitting: Family Medicine

## 2017-05-06 ENCOUNTER — Ambulatory Visit: Payer: Self-pay | Admitting: Surgery

## 2017-05-06 DIAGNOSIS — Z86010 Personal history of colonic polyps: Secondary | ICD-10-CM | POA: Diagnosis not present

## 2017-05-16 ENCOUNTER — Ambulatory Visit (INDEPENDENT_AMBULATORY_CARE_PROVIDER_SITE_OTHER): Payer: Medicare Other | Admitting: Family Medicine

## 2017-05-16 ENCOUNTER — Encounter: Payer: Self-pay | Admitting: Family Medicine

## 2017-05-16 VITALS — BP 137/80 | HR 95 | Temp 98.2°F | Wt 205.6 lb

## 2017-05-16 DIAGNOSIS — F17200 Nicotine dependence, unspecified, uncomplicated: Secondary | ICD-10-CM

## 2017-05-16 DIAGNOSIS — Z716 Tobacco abuse counseling: Secondary | ICD-10-CM | POA: Diagnosis not present

## 2017-05-16 MED ORDER — NICOTINE POLACRILEX 2 MG MT GUM
2.0000 mg | CHEWING_GUM | OROMUCOSAL | 0 refills | Status: DC | PRN
Start: 2017-05-16 — End: 2018-06-13

## 2017-05-16 NOTE — Progress Notes (Signed)
    Subjective:  Lindsey Payne is a 66 y.o. female who presents to the [facility] today to discuss smoking cessation  HPI:  Lindsey Payne is a longtime smoker and started smoking at age 35.  She has been about 3 cigarettes for the past 7 years.  She wants to quit smoking. She has a history ischemic cardiomyopathy, HTN, HLD and has never had a CVA or CAD history.  Previously tried gum, patch and chantix. Worried about trying the patch and are open to trying the gum.  She does drink a beer every now and then.    She feels that stopping smoking will be most difficult after eating, with drinking coffee or beer.  She is the only one of her friends who smokes. She currently denies any CP, SOB, headaches, changes in vision or lower extremity swelling. Denies any history of hemoptysis.   PMH: see above Tobacco use: 3 cigarettes per day Medication: reviewed and updated ROS: see HPI   Objective:  Physical Exam: BP 137/80   Pulse 95   Temp 98.2 F (36.8 C) (Oral)   Wt 205 lb 9.6 oz (93.3 kg)   SpO2 99%   BMI 30.36 kg/m   Gen: 66 yo NAD, resting comfortably CV: RRR with no murmurs appreciated Pulm: NWOB, CTAB with no crackles, wheezes, or rhonchi GI: Normal bowel sounds present. Soft, Nontender, Nondistended. MSK: no edema, cyanosis, or clubbing noted Skin: warm, dry Neuro: grossly normal, moves all extremities Psych: Normal affect and thought content  No results found for this or any previous visit (from the past 72 hour(s)).   Assessment/Plan:  TOBACCO ABUSE Patient is here today to discuss smoking cessation.  She has previous he tried Chantix and nicotine gum and is hesitant to try nicotine patches because someone told her that this has a likelihood of increasing cancer.  She decided that nicotine gum would be the best option at this time.  I prescribed the 2 mg gum and recommended that she chew 1-2 pieces of gum an hour for the first 6 weeks and then she will taper from there.  I  recommended that she follow-up in 2 weeks and we can see how she is doing and she agrees.  Oluwadara Gorman L. Rosalyn Gess, [facility] Resident PGY-2 05/16/2017 2:11 PM

## 2017-05-16 NOTE — Patient Instructions (Addendum)
Lindsey Payne,  Today you were seen to discuss your smoking.  After some discussion you feel that trying the nicotine gum will be the best option at this time.  I have prescribed you 2 mg gum.  To use this properly you will chew it until the gum has a "peppery" taste and then you should "park to the gum" in your gum.  When the tingling is gone (usually about a minute) start chewing again. One piece will last about 30 min.   Nicotine gum may be used regularly by chewing one piece of gum every 1 to 2 hours for the first 6 weeks, followed by one piece every 2 to 4 hours for 3 weeks, and then one piece every 4 to 8 hours for 3 weeks.  Please follow-up with me in 2 weeks and we can see how everything is going.   Very nice to see you today, Lindsey Payne L. Rosalyn Gess, [facility] Resident PGY-2 05/16/2017 2:09 PM

## 2017-05-16 NOTE — Assessment & Plan Note (Signed)
Patient is here today to discuss smoking cessation.  She has previous he tried Chantix and nicotine gum and is hesitant to try nicotine patches because someone told her that this has a likelihood of increasing cancer.  She decided that nicotine gum would be the best option at this time.  I prescribed the 2 mg gum and recommended that she chew 1-2 pieces of gum an hour for the first 6 weeks and then she will taper from there.  I recommended that she follow-up in 2 weeks and we can see how she is doing and she agrees.

## 2017-06-06 ENCOUNTER — Other Ambulatory Visit: Payer: Self-pay | Admitting: Family Medicine

## 2017-06-10 ENCOUNTER — Ambulatory Visit: Payer: Self-pay | Admitting: Surgery

## 2017-06-10 DIAGNOSIS — Z01810 Encounter for preprocedural cardiovascular examination: Secondary | ICD-10-CM | POA: Diagnosis not present

## 2017-06-10 DIAGNOSIS — I2510 Atherosclerotic heart disease of native coronary artery without angina pectoris: Secondary | ICD-10-CM | POA: Diagnosis not present

## 2017-06-17 ENCOUNTER — Other Ambulatory Visit: Payer: Self-pay | Admitting: Family Medicine

## 2017-06-17 DIAGNOSIS — Z1231 Encounter for screening mammogram for malignant neoplasm of breast: Secondary | ICD-10-CM

## 2017-07-01 ENCOUNTER — Ambulatory Visit (INDEPENDENT_AMBULATORY_CARE_PROVIDER_SITE_OTHER): Payer: Medicare Other | Admitting: Family Medicine

## 2017-07-01 ENCOUNTER — Other Ambulatory Visit: Payer: Self-pay

## 2017-07-01 ENCOUNTER — Encounter: Payer: Self-pay | Admitting: Family Medicine

## 2017-07-01 VITALS — BP 124/86 | HR 68 | Temp 98.4°F | Ht 69.0 in | Wt 210.4 lb

## 2017-07-01 DIAGNOSIS — S46811A Strain of other muscles, fascia and tendons at shoulder and upper arm level, right arm, initial encounter: Secondary | ICD-10-CM | POA: Diagnosis not present

## 2017-07-01 MED ORDER — CYCLOBENZAPRINE HCL 10 MG PO TABS: 10.0000 mg | ORAL_TABLET | Freq: Three times a day (TID) | ORAL | 0 refills | Status: DC | PRN

## 2017-07-01 NOTE — Progress Notes (Signed)
    Subjective:  Lindsey Payne is a 66 y.o. female [facility] with a chief complaint of R shoulder pain  HPI:  R shoulder pain Pain in R upper back that began about 81mo ago when she started buying 24pack of water at the supermarket and having to pick it up on her own.  Denies numbness or tingling going down extremities. Denies spinal tenderness, fevers, chills, incontinence, weakness.  She denies any trauma or history of osteoarthritis.Marland Kitchen  PMH: Tobacco abuse, hypertension, heart failure Tobacco use: Current smoker Medication: reviewed and updated ROS: see HPI   Objective:  Physical Exam: BP 124/86   Pulse 68   Temp 98.4 F (36.9 C) (Oral)   Ht 5\' 9"  (1.753 m)   Wt 210 lb 6.4 oz (95.4 kg)   SpO2 99%   BMI 31.07 kg/m   Gen: 66 year old female in NAD, resting comfortably CV: RRR with no murmurs appreciated Pulm: NWOB, CTAB with no crackles, wheezes, or rhonchi GI: Normal bowel sounds present. Soft, Nontender, Nondistended. MSK: Bilateral shoulders with full active and passive range of motion without tenderness.  Mild tenderness to palpation at the insertion site of right trapezius as well as in the body of the trapezius on the right side.  No spinal or paraspinal tenderness.  No [place] or AC joint tenderness.  Strong rotator cuff bilaterally.  Skin: warm, dry Neuro: grossly normal, moves all extremities Psych: Normal affect and thought content  No results found for this or any previous visit (from the past 72 hour(s)).   Assessment/Plan:  Right trapezius strain Patient reports 1 month of right upper back pain without radiation, numbness or tingling down her extremity, weakness, fevers, chills and has no history of IV drug use.  No other red flag signs or symptoms.  Full range of motion bilateral shoulders and tenderness to palpation at the insertion site of the right trapezius as well as in the body of the trapezius.  Most consistent with strain of the  trapezius.  Recommended moist heat 20-30 minutes every 4 hours until pain is relieved she can also use ibuprofen as needed for pain.  Provided patient with Flexeril 10 mg to use as needed for muscle spasm.  Recommend follow-up in a few weeks if symptoms do not improve.   Kinsie Belford L. Rosalyn Gess, [place] Resident PGY-2 07/01/2017 8:59 AM

## 2017-07-01 NOTE — Patient Instructions (Signed)
You were seen today for pain in your right upper shoulder.  Fortunately you do not have any numbness or tingling in your arms or pain shooting down your arms, which tells me this is not a nerve problem.  Based on the location of your pain you most likely strained your trapezius muscle.  I would recommend applying moist heat to the neck for 20-30 minutes every 3-4 hours until the pain goes away.  You can also use some ibuprofen as needed.  I have also given a prescription for a muscle relaxer that may help for any spasms.  This should go away on its own please follow-up if the pain persists over the next few weeks.   Very nice to see you.  Continue to work on the smoking!  Enjoy the holidays!  Lindsey Payne, [facility] Resident PGY-2 07/01/2017 8:50 AM

## 2017-07-21 NOTE — H&P (Signed)
Bernette Redbird  Location: [facility] Patient #: [id number] DOB: Oct 11, 1950 Single / Language: Cleophus Molt / Race: Black or African American Female   History of Present Illness   The patient is a 67 year old female who presents with a complaint of for evaluation.  The PCP is Dr. Hart Rochester  She comes by herself.  Ms. Arzola is a 67 year old black female who had a right hemicolectomy by Dr. Alphonsa Overall in [phone] for a cecal tubulovillous adenoma. At that time, there was no evidence of malignancy. Her last colonoscopy was 06/15/2004. I have not seen Ms. Morioka in over 10 years. She looks good. She had her right hip replacement 10/15/14 and is doing well from this. She is now for follow-up colonoscopy.  She is followed by [place] for primary medical standpoint but could not remember the name of her primary care physician (Dr. Rosalyn Gess) and Dr. Ezzard Standing from a cardiac standpoint. She's had two remote cardiac stents, but it sounds like she has been stable from a cardiac standpoint. She is having no gastrointestinal symptoms. She is eating well, her bowels are working well, and she has no abdominal pain. She is now over 10 years since her last colonoscopy because of her history she needs a follow-up colonoscopy.  Plan: We'll get cardiac clearance and schedule colonoscopy. She has been given a split bowel prep.  Past Medical History: 1. Right wrist ganglion excision - 10-14-16 - [facility] 2. 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 History of CAD Seen by Dr. Wynonia Lawman Card cath - 08/26/2005 History of stents x 2  Social History: Husband died in 1994-10-15 Has a fiancee - Beecher Mcardle (but not plans to marry)   Allergies Malachy Moan, [place]; 05/06/2017 9:13 AM) Valium *ANTIANXIETY AGENTS*  Allergies Reconciled   Medication History Malachy Moan, RMA; 05/06/2017 9:14 AM) Isosorbide  Dinitrate (20MG  Tablet, Oral) Active. HydrALAZINE HCl (50MG  Tablet, Oral) Active. Potassium Chloride Crys ER (20MEQ Tablet ER, Oral) Active. HydrOXYzine HCl (25MG  Tablet, Oral) Active. Spironolactone (25MG  Tablet, Oral) Active. Rosuvastatin Calcium (20MG  Tablet, Oral) Active. PARoxetine HCl (40MG  Tablet, Oral) Active. Metoprolol Succinate ER (25MG  Tablet ER 24HR, Oral) Active. Lisinopril (20MG  Tablet, Oral) Active. Furosemide (40MG  Tablet, Oral) Active. Medications Reconciled  Vitals Malachy Moan RMA; 05/06/2017 9:13 AM) 05/06/2017 9:12 AM Weight: 209.2 lb Height: 68in Body Surface Area: 2.08 m Body Mass Index: 31.81 kg/m  Temp.: 98.26F  Pulse: 70 (Regular)  BP: 160/90 (Sitting, Left Arm, Standard)   Physical Exam  General: WN older AA F alert and generally healthy appearing. HEENT: Normal. Pupils equal.  Neck: Supple. No mass. No thyroid mass. Lymph Nodes: No supraclavicular or cervical nodes.  Lungs: Clear to auscultation and symmetric breath sounds. Heart: RRR. No murmur or rub.  Abdomen: Soft. No mass. No tenderness. No hernia. Normal bowel sounds. Well healed midline incision.  Extremities: Good strength and ROM in upper and lower extremities.  Neurologic: Grossly intact to motor and sensory function. Psychiatric: Has normal mood and affect. Behavior is normal.   Assessment & Plan  1.  HISTORY OF COLON POLYPS (Z86.010)  Story: Right hemicolectomy by Dr. Alphonsa Overall in [phone] for a cecal tubulovillous adenoma.  Plan:  1) Cardiac clearance by Dr. Wynonia Lawman  2) Colonoscopy scheduled (split dose bowel prep)  2. History of CAD  Seen by Dr. Wynonia Lawman  Card cath - 08/26/2005  History of stents x 2   Addendum Note(Jaevon Paras H. Lucia Gaskins MD; 06/10/2017 2:40 PM)  Cardiac clearance from Dr. Wynonia Lawman on 06/10/2017 3. Right wrist ganglion excision - 10/07/2016 - [facility] 4. 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

## 2017-07-22 ENCOUNTER — Encounter (HOSPITAL_COMMUNITY)
Admission: RE | Disposition: A | Discharge status: Home / Self Care | Payer: Self-pay | Source: Ambulatory Visit | Attending: Surgery

## 2017-07-22 ENCOUNTER — Other Ambulatory Visit: Payer: Self-pay | Admitting: *Deleted

## 2017-07-22 ENCOUNTER — Ambulatory Visit (HOSPITAL_COMMUNITY)
Admission: RE | Admit: 2017-07-22 | Discharge: 2017-07-22 | Disposition: A | Discharge status: Home / Self Care | Payer: Medicare Other | Source: Ambulatory Visit | Attending: Surgery | Admitting: Surgery

## 2017-07-22 ENCOUNTER — Other Ambulatory Visit: Payer: Self-pay

## 2017-07-22 ENCOUNTER — Encounter (HOSPITAL_COMMUNITY): Payer: Self-pay

## 2017-07-22 DIAGNOSIS — Z538 Procedure and treatment not carried out for other reasons: Secondary | ICD-10-CM | POA: Insufficient documentation

## 2017-07-22 DIAGNOSIS — Z96641 Presence of right artificial hip joint: Secondary | ICD-10-CM | POA: Diagnosis not present

## 2017-07-22 DIAGNOSIS — Z1211 Encounter for screening for malignant neoplasm of colon: Secondary | ICD-10-CM | POA: Insufficient documentation

## 2017-07-22 DIAGNOSIS — Z86010 Personal history of colonic polyps: Secondary | ICD-10-CM | POA: Diagnosis not present

## 2017-07-22 DIAGNOSIS — Z888 Allergy status to other drugs, medicaments and biological substances status: Secondary | ICD-10-CM | POA: Insufficient documentation

## 2017-07-22 DIAGNOSIS — Z955 Presence of coronary angioplasty implant and graft: Secondary | ICD-10-CM | POA: Insufficient documentation

## 2017-07-22 DIAGNOSIS — Z9049 Acquired absence of other specified parts of digestive tract: Secondary | ICD-10-CM | POA: Insufficient documentation

## 2017-07-22 DIAGNOSIS — K5730 Diverticulosis of large intestine without perforation or abscess without bleeding: Secondary | ICD-10-CM | POA: Insufficient documentation

## 2017-07-22 DIAGNOSIS — Z79899 Other long term (current) drug therapy: Secondary | ICD-10-CM | POA: Diagnosis not present

## 2017-07-22 DIAGNOSIS — I2510 Atherosclerotic heart disease of native coronary artery without angina pectoris: Secondary | ICD-10-CM | POA: Insufficient documentation

## 2017-07-22 HISTORY — PX: COLONOSCOPY: SHX5424

## 2017-07-22 SURGERY — COLONOSCOPY
Anesthesia: Moderate Sedation

## 2017-07-22 MED ORDER — MIDAZOLAM HCL 5 MG/5ML IJ SOLN
INTRAMUSCULAR | Status: DC | PRN
  Administered 2017-07-22 (×2): 2 mg via INTRAVENOUS

## 2017-07-22 MED ORDER — FENTANYL CITRATE (PF) 100 MCG/2ML IJ SOLN
INTRAMUSCULAR | Status: DC | PRN
  Administered 2017-07-22 (×2): 25 ug via INTRAVENOUS

## 2017-07-22 MED ORDER — SODIUM CHLORIDE 0.9 % IV SOLN
INTRAVENOUS | Status: DC
  Administered 2017-07-22: 500 mL via INTRAVENOUS

## 2017-07-22 MED ORDER — FENTANYL CITRATE (PF) 100 MCG/2ML IJ SOLN
INTRAMUSCULAR | Status: AC
  Filled 2017-07-22: qty 2

## 2017-07-22 MED ORDER — DIPHENHYDRAMINE HCL 50 MG/ML IJ SOLN
INTRAMUSCULAR | Status: AC
  Filled 2017-07-22: qty 1

## 2017-07-22 MED ORDER — MIDAZOLAM HCL 5 MG/ML IJ SOLN
INTRAMUSCULAR | Status: AC
  Filled 2017-07-22: qty 1

## 2017-07-22 NOTE — Interval H&P Note (Signed)
History and Physical Interval Note:  07/22/2017 7:20 AM  Lindsey Payne  has presented today for surgery, with the diagnosis of History of colon polyps and colon resection  The various methods of treatment have been discussed with the patient and family.   Sister, Gene, with patient.  After consideration of risks, benefits and other options for treatment, the patient has consented to  Procedure(s) with comments: COLONOSCOPY (N/A) - NO ANESTHESIA PER DR. Lucia Gaskins as a surgical intervention .  The patient's history has been reviewed, patient examined, no change in status, stable for surgery.  I have reviewed the patient's chart and labs.  Questions were answered to the patient's satisfaction.     Shann Medal

## 2017-07-22 NOTE — Op Note (Signed)
07/22/2017  8:21 AM  PATIENT:  Bernette Redbird, 67 y.o., female, MRN: [id number]  PREOP DIAGNOSIS:  History of colon polyps and colon resection  POSTOP DIAGNOSIS:   Mild sigmoid diverticulosis, normal colon, but poor prep of colon proximally  PROCEDURE:   Procedure(s): COLONOSCOPY [photos in Epic]  SURGEON:   Alphonsa Overall, M.D.  ANESTHESIA:  Versed 4 mg, Fentanyl 50 mcgm  SPECIMEN:   None  INDICATIONS FOR PROCEDURE:  DAJHA URQUILLA is a 67 y.o. (DOB: 06-25-1951) AA female whose primary care physician is Eloise Levels, MD and comes for colonoscopy.     She had a right hemicolectomy by Dr. Alphonsa Overall in [phone] for a cecal tubulovillous adenoma. At that time, there was no evidence of malignancy. Her last colonoscopy was 06/15/2004.  She's had no colonic symptoms.   The indications and risks of colonoscopy were explained to the patient.  The risks include, but are not limited to, perforation of the bowel and bleeding.  OPERATIVE NOTE:  The patient was taken to room # 1 in the [facility] endoscopy suite.  The patient was monitored with pulse oximetry, blood pressure cuff, and cardiac monitor.  The had 2 liters of nasal O2 during the procedure.  A time out was held and the checklist reviewed.   The patient was sedated with 50 mcgm of Fentanyl and 4 mgm of Versed.   A digital rectal exam was done at the beginning of the procedure..  The anus and rectum were unremarkable.     The flexible Pentax colonoscope was passed up the rectum without difficulty.  The scope was advanced to the cecum and the ileocecal valve was identified.  The colonic prep was poor for the proximal half of the colon and good for the distal half of the colon.  I spent about 20 minutes trying to irrigate stool off the wall of the proximal colon.   I advanced the scope to the right transverse colon.  There was a lot of stool in the transverse colon.  The left colon that I could see was unremarkable.  The sigmoid colon  was much better prepped and was unremarkable.   The scope was withdrawn into the rectum and retroflexed.  The rectum was unremarkable.  Photos were taken during the procedure and placed in the chart.   The patient was taken to the recovery area of the [facility] endoscopy in good condition.  Her sister,  Gene, was with the patient and I explained the findings of the procedure.   The patient's next colonoscopy should be in 3 to 5 years.  I will see her back in the office and discuss other options about the prep.  Alphonsa Overall, MD, [facility] Pager: [phone] Office phone:  [phone]

## 2017-07-22 NOTE — Discharge Instructions (Signed)
[facility] - DISCHARGE INSTRUCTIONS TO PATIENT  Activity:  Driving - May drive tomorrow  Diet:  As tolerate  Follow up appointment:  Call Dr. Pollie Friar office [facility]) at [phone] for an appointment in one month  Medications and dosages:  Resume your home medications.  Call Dr. Lucia Gaskins or his office  [phone]) if you have:  Temperature greater than 100.4,  Persistent nausea and vomiting,  Severe uncontrolled pain,  Any other questions or concerns you may have after discharge.  In an emergency, call 911 or go to an Emergency Department at a nearby hospital.

## 2017-07-26 ENCOUNTER — Ambulatory Visit
Admission: RE | Admit: 2017-07-26 | Discharge: 2017-07-26 | Disposition: A | Discharge status: Home / Self Care | Payer: Medicare Other | Source: Ambulatory Visit | Attending: Family Medicine | Admitting: Family Medicine

## 2017-07-26 DIAGNOSIS — Z1231 Encounter for screening mammogram for malignant neoplasm of breast: Secondary | ICD-10-CM | POA: Diagnosis not present

## 2017-08-13 ENCOUNTER — Other Ambulatory Visit: Payer: Self-pay | Admitting: Family Medicine

## 2017-08-16 ENCOUNTER — Other Ambulatory Visit: Payer: Self-pay | Admitting: Family Medicine

## 2017-08-17 ENCOUNTER — Other Ambulatory Visit: Payer: Self-pay | Admitting: *Deleted

## 2017-08-18 ENCOUNTER — Other Ambulatory Visit: Payer: Self-pay

## 2017-08-18 MED ORDER — PAROXETINE HCL 40 MG PO TABS: 40.0000 mg | ORAL_TABLET | Freq: Every morning | ORAL | 3 refills | Status: DC

## 2017-08-19 ENCOUNTER — Telehealth: Payer: Self-pay | Admitting: Family Medicine

## 2017-08-19 DIAGNOSIS — R6889 Other general symptoms and signs: Secondary | ICD-10-CM | POA: Diagnosis not present

## 2017-08-19 DIAGNOSIS — I10 Essential (primary) hypertension: Secondary | ICD-10-CM | POA: Diagnosis not present

## 2017-08-19 MED ORDER — ISOSORBIDE DINITRATE 20 MG PO TABS: ORAL_TABLET | ORAL | 3 refills | Status: DC

## 2017-08-19 NOTE — Telephone Encounter (Signed)
Contacted pt and she said that she had already seen a doctor today and also she wanted to let her PCP know that her insurance/pharmacy would be requesting refills on her medication.  Routing to PCP so he can look out for the request. Katharina Caper, Miyu Fenderson D, CMA

## 2017-08-19 NOTE — Telephone Encounter (Signed)
Pt states she has chest congestion and it is hard to breath.  She is coughing a lot.  She needs some medicine called in.  [facility] [address]. There are no appts today and she didn't want to wait until Monday to see a dr. Please advise

## 2017-08-19 NOTE — Telephone Encounter (Signed)
Without seeing the patient I cannot make any recommendations at this time.  If she still feels unwell over the weekend she can be seen at urgent care or on Monday in clinic for a same day visit.

## 2017-08-25 ENCOUNTER — Other Ambulatory Visit: Payer: Self-pay | Admitting: Family Medicine

## 2017-08-25 NOTE — Telephone Encounter (Signed)
Pt wants refills on: potassium, hydroxline, metoprolol, fursoemide, lisonpril, spirolatone.  She did not want to leave a message on VM for triage.  [facility] is the pharmacy.  She is out of some of them and is getting sick

## 2017-08-29 ENCOUNTER — Other Ambulatory Visit: Payer: Self-pay

## 2017-08-29 ENCOUNTER — Other Ambulatory Visit: Payer: Self-pay | Admitting: Family Medicine

## 2017-08-29 MED ORDER — HYDRALAZINE HCL 50 MG PO TABS: 50.0000 mg | ORAL_TABLET | Freq: Two times a day (BID) | ORAL | 3 refills | Status: DC

## 2017-08-29 MED ORDER — POTASSIUM CHLORIDE CRYS ER 20 MEQ PO TBCR: 20.0000 meq | EXTENDED_RELEASE_TABLET | Freq: Two times a day (BID) | ORAL | 3 refills | Status: DC

## 2017-08-29 MED ORDER — LISINOPRIL 20 MG PO TABS: 20.0000 mg | ORAL_TABLET | Freq: Every day | ORAL | 5 refills | Status: DC

## 2017-08-29 MED ORDER — FUROSEMIDE 40 MG PO TABS: ORAL_TABLET | ORAL | 3 refills | Status: DC

## 2017-08-29 MED ORDER — SPIRONOLACTONE 25 MG PO TABS: 25.0000 mg | ORAL_TABLET | Freq: Every day | ORAL | 3 refills | Status: DC

## 2017-08-29 MED ORDER — HYDROXYZINE HCL 25 MG PO TABS: 25.0000 mg | ORAL_TABLET | Freq: Every evening | ORAL | 0 refills | Status: DC | PRN

## 2017-08-29 NOTE — Telephone Encounter (Signed)
Please resend Isosorbide to [facility]. Danley Danker, RN [facility] RN)

## 2017-08-30 IMAGING — RF DG HIP (WITH PELVIS) OPERATIVE*R*
1 series · 2 of 2 positions shown · non-contrast
Comparison: None.

CLINICAL DATA: Right total hip arthroplasty.

EXAM:
OPERATIVE RIGHT HIP (WITH PELVIS IF PERFORMED) 2 VIEWS
TECHNIQUE: Fluoroscopic spot image(s) were submitted for interpretation
post-operatively.

[Series 1: run · 2 of 2 slices shown]
[im 1/2]
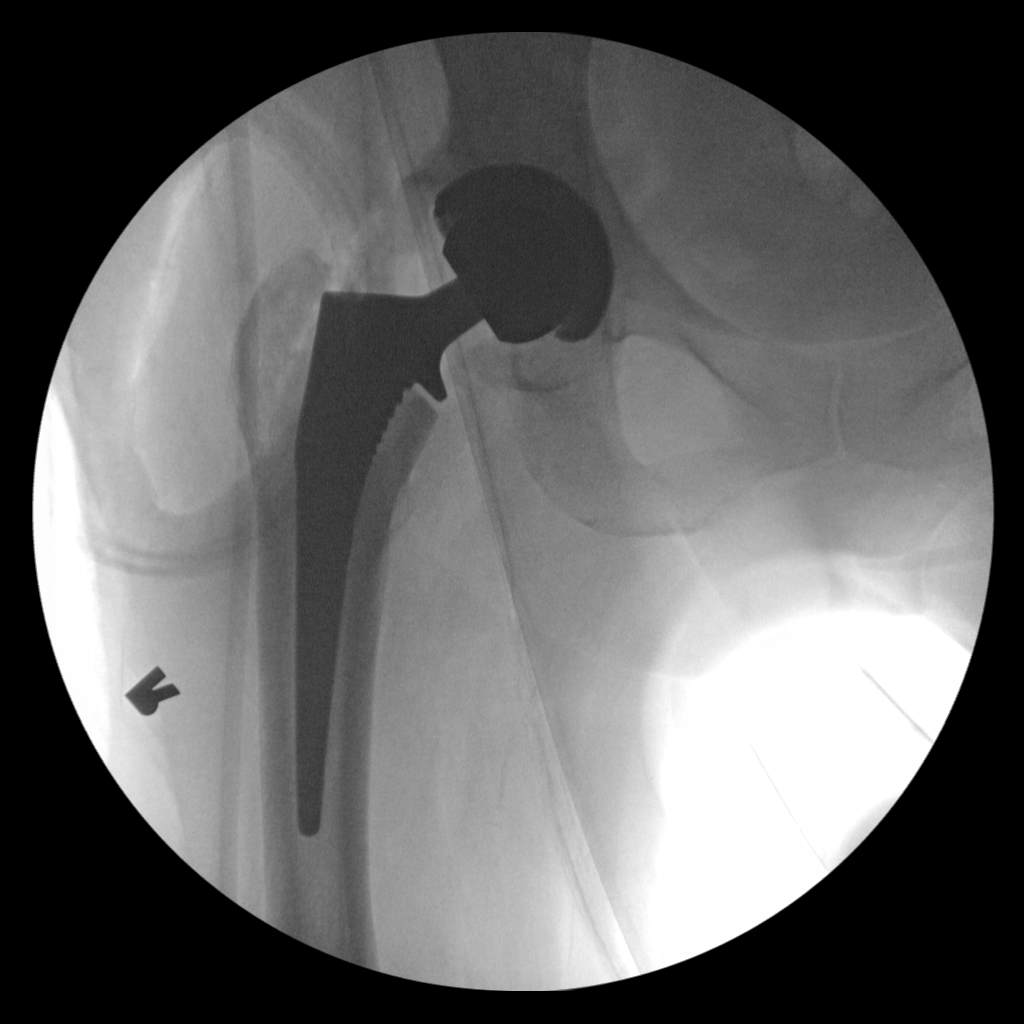
[im 2/2]
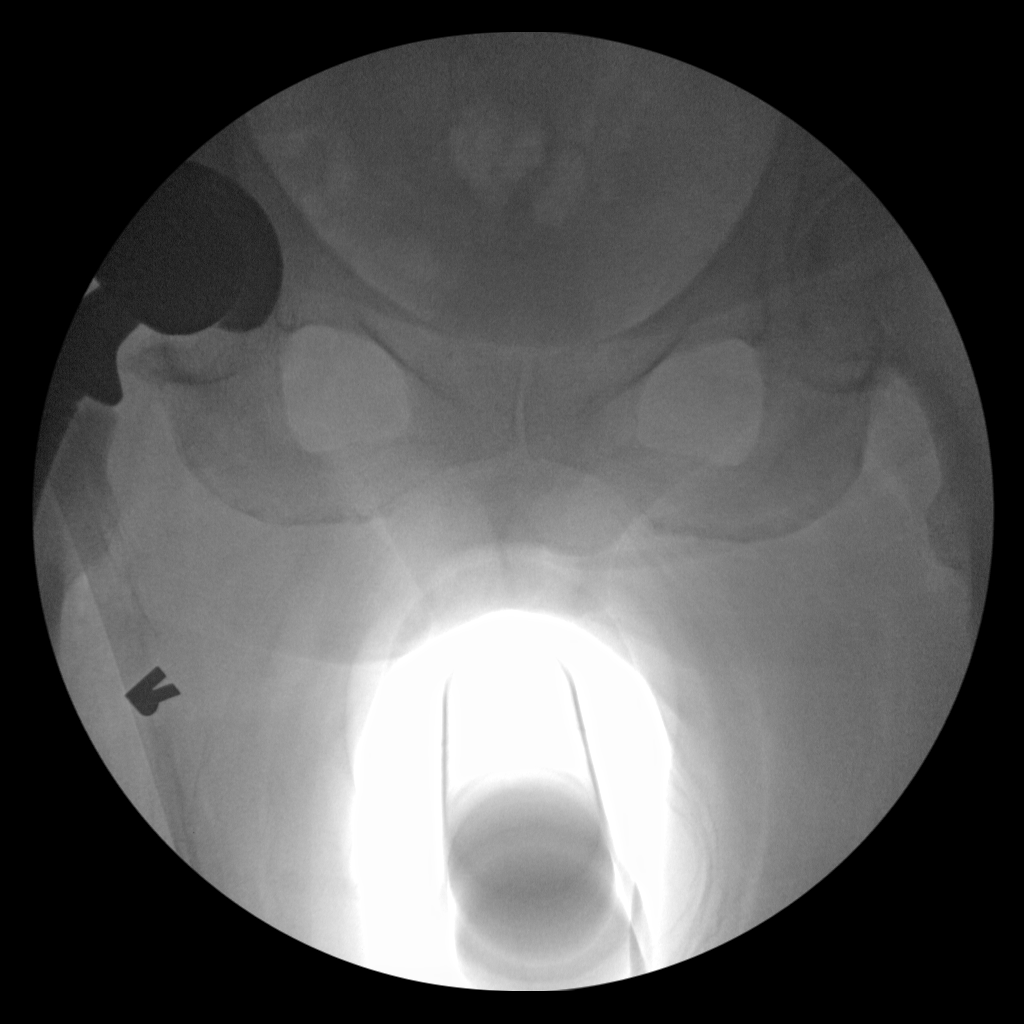

[2 of 2 positions shown; findings below may reference images not displayed]

FINDINGS: Two intraoperative views of the right hip are submitted
postoperatively for interpretation.

Right total hip arthroplasty identified without definite
complicating features.
IMPRESSION: Right total hip arthroplasty without definite complicating features.

## 2017-08-30 IMAGING — CR DG HIP (WITH OR WITHOUT PELVIS) 1V PORT*R*
2 series · 2 of 2 positions shown · non-contrast
Comparison: Intraoperative images obtained earlier in the day

CLINICAL DATA: Status post total hip replacement on the right

EXAM:
DG HIP (WITH OR WITHOUT PELVIS) 2-3V PORT RIGHT

[AP (1 of 2)]
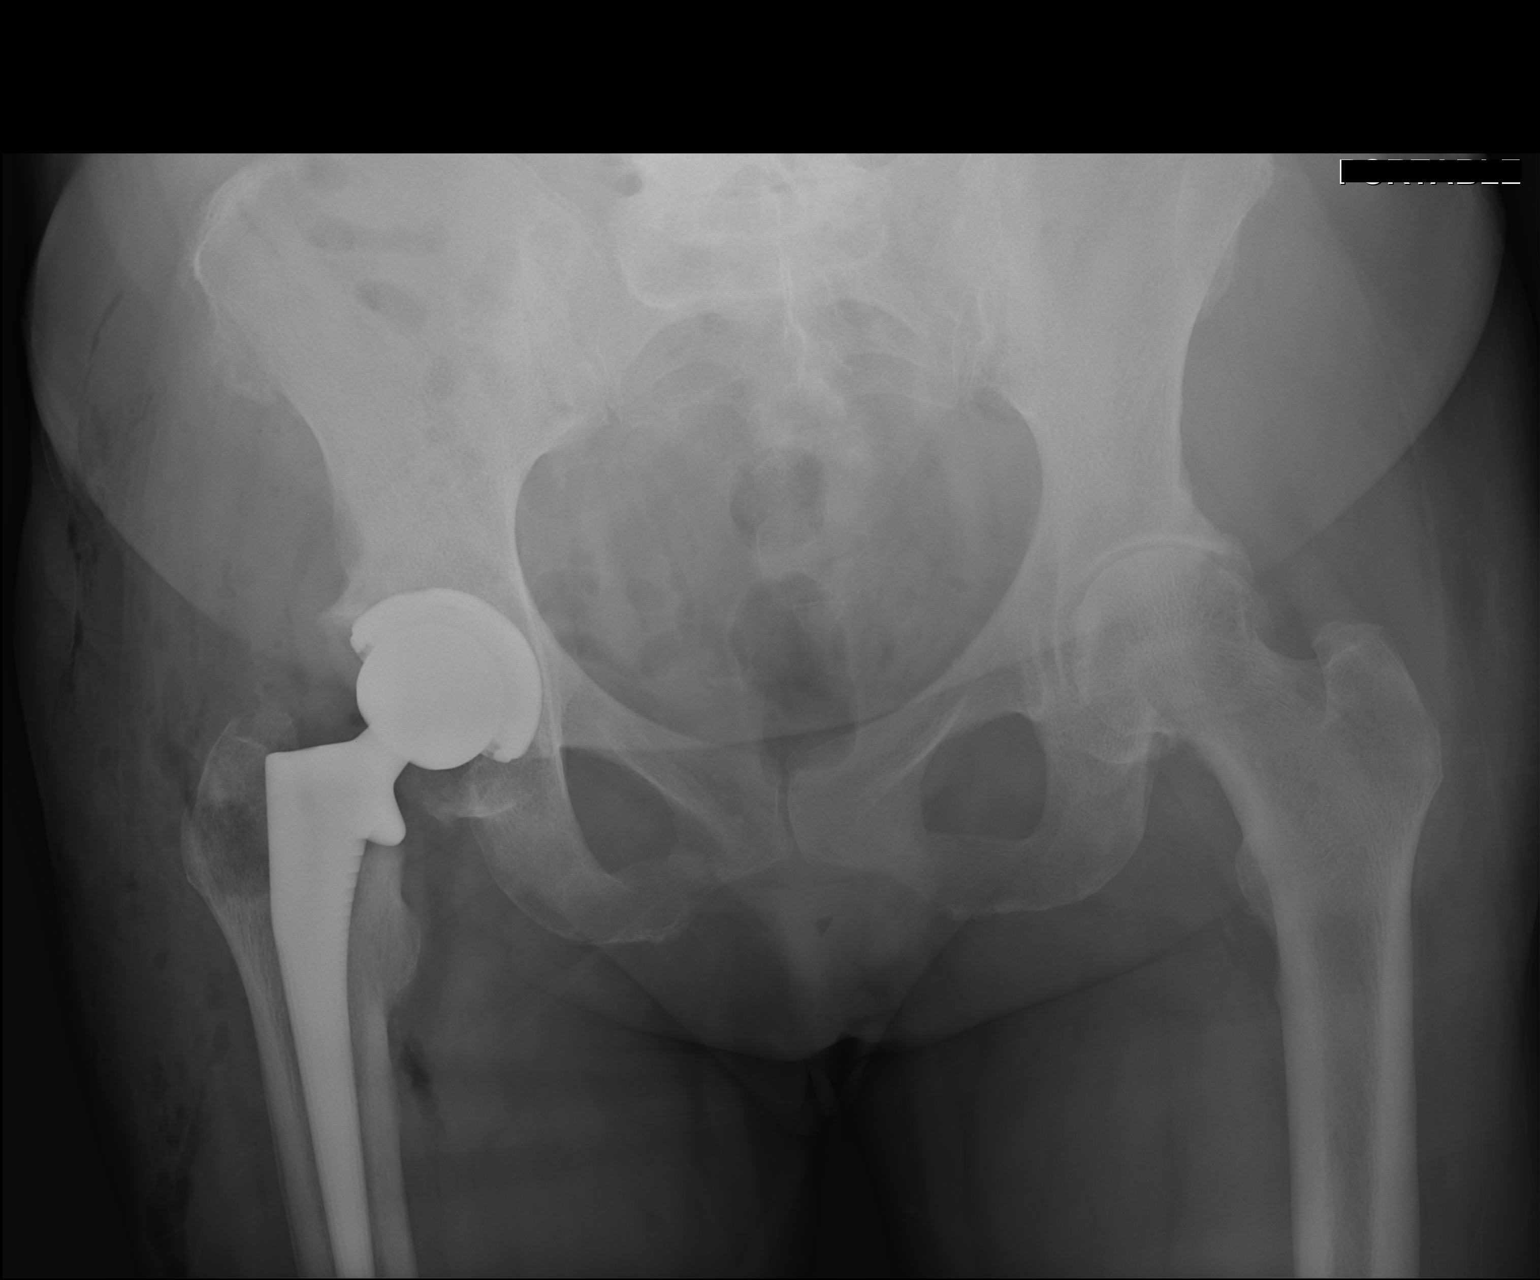

[AP (2 of 2)]
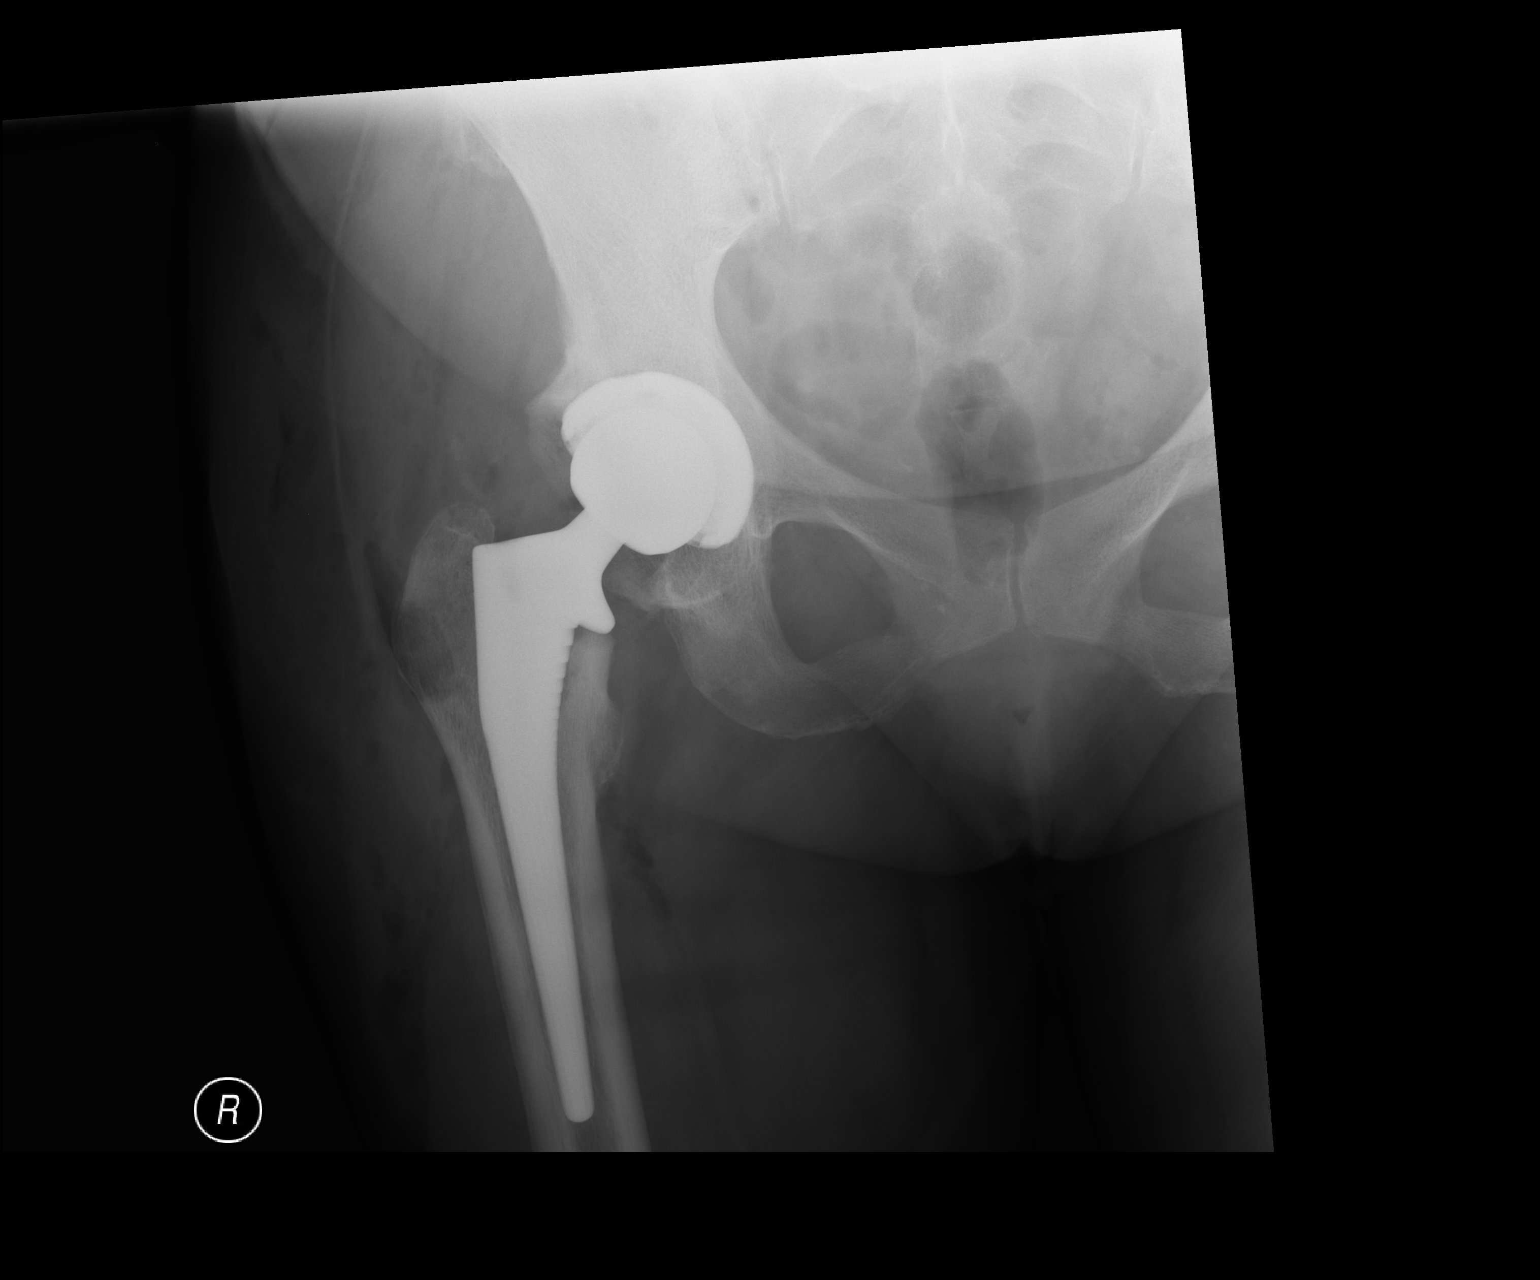

[2 of 2 positions shown; findings below may reference images not displayed]

FINDINGS: Frontal pelvis as well as frontal and lateral right hip images were
obtained. There is a total hip prosthesis on the right with
prosthetic components appearing well-seated. No acute fracture or
dislocation. Air on the right side is an expected postoperative
finding. There is moderate narrowing of the left hip joint. No
erosive change.
IMPRESSION: Postoperative change on the right. Prosthetic components on the
right appear well seated. Narrowing left hip joint. No acute
fracture or dislocation.

## 2017-08-30 MED ORDER — ISOSORBIDE DINITRATE 20 MG PO TABS: ORAL_TABLET | ORAL | 3 refills | Status: DC

## 2017-08-30 NOTE — Telephone Encounter (Signed)
Fax sent from [facility]:  Medical Clarification Request.   Please clarify the form for Hydroxyzine 25mg :  Hydroxyzine HCL Tab 25 mg (Atarax) Hydroxyzine PAM Tab 25 mg (Vistaril)

## 2017-09-02 NOTE — Telephone Encounter (Signed)
Lindsey Payne

## 2017-09-05 NOTE — Telephone Encounter (Signed)
[facility] informed. Lindsey Payne, Lindsey Payne, CMA

## 2017-10-05 ENCOUNTER — Other Ambulatory Visit: Payer: Self-pay | Admitting: Family Medicine

## 2017-10-06 ENCOUNTER — Other Ambulatory Visit: Payer: Self-pay

## 2017-10-06 MED ORDER — HYDROXYZINE HCL 25 MG PO TABS: 25.0000 mg | ORAL_TABLET | Freq: Every evening | ORAL | 0 refills | Status: DC | PRN

## 2017-10-07 ENCOUNTER — Other Ambulatory Visit: Payer: Self-pay

## 2017-10-07 MED ORDER — METOPROLOL SUCCINATE ER 25 MG PO TB24: 25.0000 mg | ORAL_TABLET | Freq: Two times a day (BID) | ORAL | 2 refills | Status: DC

## 2017-10-07 MED ORDER — LISINOPRIL 20 MG PO TABS: 20.0000 mg | ORAL_TABLET | Freq: Every day | ORAL | 5 refills | Status: DC

## 2017-10-10 ENCOUNTER — Other Ambulatory Visit: Payer: Self-pay | Admitting: Family Medicine

## 2017-10-10 MED ORDER — HYDRALAZINE HCL 50 MG PO TABS: 50.0000 mg | ORAL_TABLET | Freq: Two times a day (BID) | ORAL | 3 refills | Status: DC

## 2017-10-10 NOTE — Telephone Encounter (Signed)
Pt is adamant that she has NEVER taken hydroxyzine.  She needs a refill on hydralazine.  She says the dr has been sending in the wrong prescription.  Even though it was explained she had refills remaining and just needed to call [facility] to refill it, she said she had already done everything to get this medicine.  Please advise

## 2017-10-21 ENCOUNTER — Ambulatory Visit (INDEPENDENT_AMBULATORY_CARE_PROVIDER_SITE_OTHER): Payer: Medicare Other | Admitting: Family Medicine

## 2017-10-21 ENCOUNTER — Encounter: Payer: Self-pay | Admitting: Family Medicine

## 2017-10-21 ENCOUNTER — Other Ambulatory Visit: Payer: Self-pay

## 2017-10-21 VITALS — BP 110/78 | HR 88 | Temp 98.3°F | Ht 69.0 in | Wt 211.0 lb

## 2017-10-21 DIAGNOSIS — Z760 Encounter for issue of repeat prescription: Secondary | ICD-10-CM

## 2017-10-21 NOTE — Progress Notes (Signed)
Subjective:    Patient ID: Lindsey Payne, female    DOB: September 01, 1950, 67 y.o.   MRN: [id number]   CC: Meet new PCP  HPI: Patient is a 67 year old female with a past medical history significant for hypertension, CHF, osteoarthritis, hyperlipidemia, tobacco abuse, depression who presents here today to meet new PCP.  Patient has no acute complaints today.  She reports she is here to meet her new doctor she was previously seen by Dr. Rosalyn Gess.  Patient reports that she has been prescribed hydroxyzine but has never taken it.  Patient states she is on hydralazine for high blood pressure and has been on it for many years.  She thinks that there has been a mixup.  Patient is requesting removal of hydroxyzine from medication list.  She currently denies any chest pain, abdominal pain, shortness of breath, nausea, vomiting, headache, dizziness.  Smoking status reviewed   ROS: all other systems were reviewed and are negative other than in the HPI   Past Medical History:  Diagnosis Date  . Adenoma of large intestine   . Arthritis   . Asthma   . Back pain   . CHF (congestive heart failure) ([place])   . Coronary artery disease   . Depression   . Ganglion cyst    rt wrist  . Hemorrhoids   . Hyperlipidemia   . Hypertension   . Loss of teeth due to extraction 09/29/2016  . Lung collapse 1984   left lung, no problems since then  . Transfusion history   . Tubular adenoma   . Wears glasses     Past Surgical History:  Procedure Laterality Date  . ABDOMINAL HYSTERECTOMY     has one ovary left  . BACK SURGERY     3 disc removed  . CESAREAN SECTION    . COLON SURGERY  1987   benign tubulovillous adenoma  . COLONOSCOPY N/A 07/22/2017   Procedure: COLONOSCOPY;  Surgeon: Alphonsa Overall, MD;  Location: [facility];  Service: General;  Laterality: N/A;  NO ANESTHESIA PER DR. Lucia Gaskins  . CORONARY ANGIOPLASTY  2005   w 2 stents  . GANGLION CYST EXCISION Right 10/07/2016   Procedure: Right wrist volar  ganglion excision;  Surgeon: Iran Planas, MD;  Location: [facility];  Service: Orthopedics;  Laterality: Right;  . Heart stints     Dr. Wynonia Lawman  . LUNG SURGERY Left    for collasped lung  . TONSILLECTOMY    . TOTAL HIP ARTHROPLASTY Right 05/13/2015   Procedure: RIGHT TOTAL HIP ARTHROPLASTY ANTERIOR APPROACH;  Surgeon: Mcarthur Rossetti, MD;  Location: [facility];  Service: Orthopedics;  Laterality: Right;    Past medical history, surgical, family, and social history reviewed and updated in the EMR as appropriate.  Objective:  BP 110/78   Pulse 88   Temp 98.3 F (36.8 C) (Oral)   Ht 5\' 9"  (1.753 m)   Wt 211 lb (95.7 kg)   SpO2 98%   BMI 31.16 kg/m   Vitals and nursing note reviewed  General: NAD, pleasant, able to participate in exam Cardiac: RRR, normal heart sounds, no murmurs. 2+ radial and PT pulses bilaterally Respiratory: CTAB, normal effort, No wheezes, rales or rhonchi Abdomen: soft, nontender, nondistended, no hepatic or splenomegaly, +BS Extremities: no edema or cyanosis. WWP. Skin: warm and dry, no rashes noted Neuro: alert and oriented x4, no focal deficits Psych: Normal affect and mood   Assessment & Plan:   #Reestablishing care with new PCP  Patient is stable on my exam.  No acute complaints today.  Seems to be adherent to current medication regimen.  Has not had blood work in a year and a half.  Will schedule physical in the next few weeks.  For the time being will discontinue hydroxyzine, did not see any indications for it.  She will follow-up on a as needed basis.    Marjie Skiff, MD [place] PGY-2

## 2018-02-01 ENCOUNTER — Other Ambulatory Visit: Payer: Self-pay

## 2018-02-01 ENCOUNTER — Encounter: Payer: Self-pay | Admitting: Family Medicine

## 2018-02-01 ENCOUNTER — Ambulatory Visit (INDEPENDENT_AMBULATORY_CARE_PROVIDER_SITE_OTHER): Payer: Medicare Other | Admitting: Family Medicine

## 2018-02-01 VITALS — BP 128/78 | HR 84 | Temp 98.2°F | Ht 69.0 in | Wt 212.0 lb

## 2018-02-01 DIAGNOSIS — I10 Essential (primary) hypertension: Secondary | ICD-10-CM | POA: Diagnosis not present

## 2018-02-01 DIAGNOSIS — F17200 Nicotine dependence, unspecified, uncomplicated: Secondary | ICD-10-CM | POA: Diagnosis not present

## 2018-02-01 DIAGNOSIS — E785 Hyperlipidemia, unspecified: Secondary | ICD-10-CM

## 2018-02-01 LAB — POCT GLYCOSYLATED HEMOGLOBIN (HGB A1C): Hemoglobin A1C: 5.6 % (ref 4.0–5.6)

## 2018-02-01 NOTE — Assessment & Plan Note (Addendum)
BP today is 128/78. Very good control. Continue current regimen with spironolactone, lisinopril. Metoprolol, isosorbide dinitrate, hydralazine.  --Will check, CMP, CBC

## 2018-02-01 NOTE — Assessment & Plan Note (Addendum)
Patient has been taking Crestor 20 mg for over a year. Last lipid panel in 2018. Will recheck lipid panel and A1c

## 2018-02-01 NOTE — Assessment & Plan Note (Signed)
Patient only smoking 2-3 cigarettes a day. She has been cutting down and is working on quitting soon. Continue to use nicorette gums. Will follow up at next office visit.

## 2018-02-01 NOTE — Patient Instructions (Signed)
It was great seeing you today! We have addressed the following issues today  1. I will follow up on the results of today's blood work 2. Keep working on cutting down the number of cigarettes you smoke daily 3. I will see you in 2-3 months  If we did any lab work today, and the results require attention, either me or my nurse will get in touch with you. If everything is normal, you will get a letter in mail and a message via . If you don't hear from [place] in two weeks, please give [place] a call. Otherwise, we look forward to seeing you again at your next visit. If you have any questions or concerns before then, please call the clinic at [phone].  Please bring all your medications to every doctors visit  Sign up for My Chart to have easy access to your labs results, and communication with your Primary care physician. Please ask Front Desk for some assistance.   Please check-out at the front desk before leaving the clinic.    Take Care,   Dr. Andy Gauss

## 2018-02-01 NOTE — Progress Notes (Signed)
Subjective:    Patient ID: Lindsey Payne, female    DOB: [birth year], 67 y.o.   MRN: [id number]   CC: Blood pressure check and review medications  HPI: Patient is 67 yo female with a past medical history significant for hypertension, CHF, osteoarthritis, hyperlipidemia, tobacco abuse, depression who presents today to follow up on blood pressure. Patient reports she has been doing well. No acute complaints today. She has been taking her medications as prescribed. Patient continue to smoke 2-3 cigarettes a day and has been trying to completely stop. She is still using the nicorette gums. Patient was seen by her cardiologist a few months ago with no significant change in her medications.   Smoking status reviewed   ROS: all other systems were reviewed and are negative other than in the HPI   Past Medical History:  Diagnosis Date  . Adenoma of large intestine   . Arthritis   . Asthma   . Back pain   . CHF (congestive heart failure) ([place])   . Coronary artery disease   . Depression   . Ganglion cyst    rt wrist  . Hemorrhoids   . Hyperlipidemia   . Hypertension   . Loss of teeth due to extraction 09/29/2016  . Lung collapse 1984   left lung, no problems since then  . Transfusion history   . Tubular adenoma   . Wears glasses     Past Surgical History:  Procedure Laterality Date  . ABDOMINAL HYSTERECTOMY     has one ovary left  . BACK SURGERY     3 disc removed  . CESAREAN SECTION    . COLON SURGERY  1987   benign tubulovillous adenoma  . COLONOSCOPY N/A 07/22/2017   Procedure: COLONOSCOPY;  Surgeon: Alphonsa Overall, MD;  Location: [facility];  Service: General;  Laterality: N/A;  NO ANESTHESIA PER DR. Lucia Gaskins  . CORONARY ANGIOPLASTY  2005   w 2 stents  . GANGLION CYST EXCISION Right 10/07/2016   Procedure: Right wrist volar ganglion excision;  Surgeon: Iran Planas, MD;  [facility];  Service: Orthopedics;  Laterality: Right;  . Heart stints      Dr. Wynonia Lawman  . LUNG SURGERY Left    for collasped lung  . TONSILLECTOMY    . TOTAL HIP ARTHROPLASTY Right 05/13/2015   Procedure: RIGHT TOTAL HIP ARTHROPLASTY ANTERIOR APPROACH;  Surgeon: Mcarthur Rossetti, MD;  Location: [facility];  Service: Orthopedics;  Laterality: Right;    Past medical history, surgical, family, and social history reviewed and updated in the EMR as appropriate.  Objective:  BP 128/78   Pulse 84   Temp 98.2 F (36.8 C) (Oral)   Ht 5\' 9"  (1.753 m)   Wt 212 lb (96.2 kg)   SpO2 98%   BMI 31.31 kg/m   Vitals and nursing note reviewed  General: NAD, pleasant, able to participate in exam Cardiac: RRR, normal heart sounds, no murmurs. 2+ radial and PT pulses bilaterally Respiratory: CTAB, normal effort, No wheezes, rales or rhonchi Abdomen: soft, nontender, nondistended, no hepatic or splenomegaly, +BS Extremities: no edema or cyanosis. WWP. Skin: warm and dry, no rashes noted Neuro: alert and oriented x4, no focal deficits Psych: Normal affect and mood   Assessment & Plan:   HYPERTENSION, BENIGN SYSTEMIC BP today is 128/78. Very good control. Continue current regimen with spironolactone, lisinopril. Metoprolol, isosorbide dinitrate, hydralazine.  --Will check, CMP, CBC  Hyperlipidemia Patient has been taking Crestor 20 mg for  over a year. Last lipid panel in 2018. Will recheck lipid panel and A1c  TOBACCO ABUSE Patient only smoking 2-3 cigarettes a day. She has been cutting down and is working on quitting soon. Continue to use nicorette gums. Will follow up at next office visit.    Marjie Skiff, MD [place] PGY-3

## 2018-02-02 ENCOUNTER — Telehealth: Payer: Self-pay | Admitting: *Deleted

## 2018-02-02 LAB — CBC WITH DIFFERENTIAL/PLATELET
BASOS: 1 %
Basophils Absolute: 0 10*3/uL (ref 0.0–0.2)
EOS (ABSOLUTE): 0.1 10*3/uL (ref 0.0–0.4)
Eos: 3 %
HEMOGLOBIN: 12.3 g/dL (ref 11.1–15.9)
Hematocrit: 36.1 % (ref 34.0–46.6)
IMMATURE GRANS (ABS): 0 10*3/uL (ref 0.0–0.1)
Immature Granulocytes: 0 %
LYMPHS ABS: 1.9 10*3/uL (ref 0.7–3.1)
LYMPHS: 40 %
MCH: 28.6 pg (ref 26.6–33.0)
MCHC: 34.1 g/dL (ref 31.5–35.7)
MCV: 84 fL (ref 79–97)
MONOCYTES: 11 %
Monocytes Absolute: 0.5 10*3/uL (ref 0.1–0.9)
NEUTROS ABS: 2.2 10*3/uL (ref 1.4–7.0)
Neutrophils: 45 %
Platelets: 245 10*3/uL (ref 150–450)
RBC: 4.3 x10E6/uL (ref 3.77–5.28)
RDW: 16 % — ABNORMAL HIGH (ref 12.3–15.4)
WBC: 4.8 10*3/uL (ref 3.4–10.8)

## 2018-02-02 LAB — LIPID PANEL
Chol/HDL Ratio: 3.4 ratio (ref 0.0–4.4)
Cholesterol, Total: 162 mg/dL (ref 100–199)
HDL: 48 mg/dL (ref >39)
LDL Calculated: 80 mg/dL (ref 0–99)
Triglycerides: 170 mg/dL — ABNORMAL HIGH (ref 0–149)
VLDL CHOLESTEROL CAL: 34 mg/dL (ref 5–40)

## 2018-02-02 LAB — CMP14+EGFR
A/G RATIO: 2.1 (ref 1.2–2.2)
ALBUMIN: 4.6 g/dL (ref 3.6–4.8)
ALK PHOS: 81 IU/L (ref 39–117)
ALT: 23 IU/L (ref 0–32)
AST: 19 IU/L (ref 0–40)
BILIRUBIN TOTAL: 0.2 mg/dL (ref 0.0–1.2)
BUN / CREAT RATIO: 12 (ref 12–28)
BUN: 12 mg/dL (ref 8–27)
CHLORIDE: 102 mmol/L (ref 96–106)
CO2: 23 mmol/L (ref 20–29)
Calcium: 9.7 mg/dL (ref 8.7–10.3)
Creatinine, Ser: 1 mg/dL (ref 0.57–1.00)
GFR calc non Af Amer: 58 mL/min/{1.73_m2} — ABNORMAL LOW (ref >59)
GFR, EST AFRICAN AMERICAN: 67 mL/min/{1.73_m2} (ref >59)
GLOBULIN, TOTAL: 2.2 g/dL (ref 1.5–4.5)
Glucose: 83 mg/dL (ref 65–99)
POTASSIUM: 4.6 mmol/L (ref 3.5–5.2)
SODIUM: 140 mmol/L (ref 134–144)
TOTAL PROTEIN: 6.8 g/dL (ref 6.0–8.5)

## 2018-02-02 NOTE — Telephone Encounter (Signed)
-----   Message from Marjie Skiff, MD sent at 02/02/2018  9:49 AM EDT ----- Katherina Mires,   Please let patient know that all her results were normal. No changes needed to her medication regimen.  Thanks  Marjie Skiff, MD [place], PGY-3

## 2018-02-02 NOTE — Telephone Encounter (Signed)
Patient aware of normal results.  Lindsey Payne,CMA

## 2018-03-25 DIAGNOSIS — H2513 Age-related nuclear cataract, bilateral: Secondary | ICD-10-CM | POA: Diagnosis not present

## 2018-05-16 ENCOUNTER — Telehealth: Payer: Self-pay | Admitting: Cardiology

## 2018-05-16 NOTE — Telephone Encounter (Signed)
LAM for patient to call back to schedule

## 2018-05-20 ENCOUNTER — Other Ambulatory Visit: Payer: Self-pay | Admitting: Family Medicine

## 2018-06-13 ENCOUNTER — Ambulatory Visit (INDEPENDENT_AMBULATORY_CARE_PROVIDER_SITE_OTHER): Payer: Medicare Other | Admitting: Cardiology

## 2018-06-13 ENCOUNTER — Encounter: Payer: Self-pay | Admitting: Cardiology

## 2018-06-13 VITALS — BP 128/72 | HR 61 | Ht 69.0 in | Wt 213.0 lb

## 2018-06-13 DIAGNOSIS — I2510 Atherosclerotic heart disease of native coronary artery without angina pectoris: Secondary | ICD-10-CM

## 2018-06-13 DIAGNOSIS — I10 Essential (primary) hypertension: Secondary | ICD-10-CM | POA: Diagnosis not present

## 2018-06-13 DIAGNOSIS — E782 Mixed hyperlipidemia: Secondary | ICD-10-CM

## 2018-06-13 DIAGNOSIS — F17210 Nicotine dependence, cigarettes, uncomplicated: Secondary | ICD-10-CM

## 2018-06-13 MED ORDER — NITROGLYCERIN 0.4 MG SL SUBL: 0.4000 mg | SUBLINGUAL_TABLET | SUBLINGUAL | 11 refills | Status: AC | PRN

## 2018-06-13 NOTE — Patient Instructions (Signed)
Medication Instructions:  Your physician has recommended you make the following change in your medication:   START Nitroglycerin 0.4 mg sublingual (under your tongue) as needed for chest pain. If experiencing chest pain, stop what you are doing and sit down. Take 1 nitroglycerin and wait 5 minutes. If chest pain continues, take another nitroglycerin and wait 5 minutes. If chest pain does not subside, take 1 more nitroglycerin and dial 911. You make take a total of 3 nitroglycerin in a 15 minute time frame.  If you need a refill on your cardiac medications before your next appointment, please call your pharmacy.   Lab work: Your physician recommends that you have the following labs drawn: CBC, BMP, TSH, liver and lipid panel to be done today.  If you have labs (blood work) drawn today and your tests are completely normal, you will receive your results only by: Marland Kitchen MyChart Message (if you have MyChart) OR . A paper copy in the mail If you have any lab test that is abnormal or we need to change your treatment, we will call you to review the results.  Testing/Procedures: None  Follow-Up: At [facility], you and your health needs are our priority.  As part of our continuing mission to provide you with exceptional heart care, we have created designated Provider Care Teams.  These Care Teams include your primary Cardiologist (physician) and Advanced Practice Providers (APPs -  Physician Assistants and Nurse Practitioners) who all work together to provide you with the care you need, when you need it.  You will need a follow up appointment in 6 months.  Please call our office 2 months in advance to schedule this appointment.  You may see another member of our [employer] Provider Team in [place]: Jenne Campus, MD . Shirlee More, MD  Any Other Special Instructions Will Be Listed Below (If Applicable).  Nitroglycerin sublingual tablets What is this medicine? NITROGLYCERIN (nye troe GLI  ser in) is a type of vasodilator. It relaxes blood vessels, increasing the blood and oxygen supply to your heart. This medicine is used to relieve chest pain caused by angina. It is also used to prevent chest pain before activities like climbing stairs, going outdoors in cold weather, or sexual activity. This medicine may be used for other purposes; ask your health care provider or pharmacist if you have questions. COMMON BRAND NAME(S): Nitroquick, Nitrostat, Nitrotab What should I tell my health care provider before I take this medicine? They need to know if you have any of these conditions: -anemia -head injury, recent stroke, or bleeding in the brain -liver disease -previous heart attack -an unusual or allergic reaction to nitroglycerin, other medicines, foods, dyes, or preservatives -pregnant or trying to get pregnant -breast-feeding How should I use this medicine? Take this medicine by mouth as needed. At the first sign of an angina attack (chest pain or tightness) place one tablet under your tongue. You can also take this medicine 5 to 10 minutes before an event likely to produce chest pain. Follow the directions on the prescription label. Let the tablet dissolve under the tongue. Do not swallow whole. Replace the dose if you accidentally swallow it. It will help if your mouth is not dry. Saliva around the tablet will help it to dissolve more quickly. Do not eat or drink, smoke or chew tobacco while a tablet is dissolving. If you are not better within 5 minutes after taking ONE dose of nitroglycerin, call 9-1-1 immediately to seek emergency  medical care. Do not take more than 3 nitroglycerin tablets over 15 minutes. If you take this medicine often to relieve symptoms of angina, your doctor or health care professional may provide you with different instructions to manage your symptoms. If symptoms do not go away after following these instructions, it is important to call 9-1-1 immediately. Do not  take more than 3 nitroglycerin tablets over 15 minutes. Talk to your pediatrician regarding the use of this medicine in children. Special care may be needed. Overdosage: If you think you have taken too much of this medicine contact a poison control center or emergency room at once. NOTE: This medicine is only for you. Do not share this medicine with others. What if I miss a dose? This does not apply. This medicine is only used as needed. What may interact with this medicine? Do not take this medicine with any of the following medications: -certain migraine medicines like ergotamine and dihydroergotamine (DHE) -medicines used to treat erectile dysfunction like sildenafil, tadalafil, and vardenafil -riociguat This medicine may also interact with the following medications: -alteplase -aspirin -heparin -medicines for high blood pressure -medicines for mental depression -other medicines used to treat angina -phenothiazines like chlorpromazine, mesoridazine, prochlorperazine, thioridazine This list may not describe all possible interactions. Give your health care provider a list of all the medicines, herbs, non-prescription drugs, or dietary supplements you use. Also tell them if you smoke, drink alcohol, or use illegal drugs. Some items may interact with your medicine. What should I watch for while using this medicine? Tell your doctor or health care professional if you feel your medicine is no longer working. Keep this medicine with you at all times. Sit or lie down when you take your medicine to prevent falling if you feel dizzy or faint after using it. Try to remain calm. This will help you to feel better faster. If you feel dizzy, take several deep breaths and lie down with your feet propped up, or bend forward with your head resting between your knees. You may get drowsy or dizzy. Do not drive, use machinery, or do anything that needs mental alertness until you know how this drug affects you.  Do not stand or sit up quickly, especially if you are an older patient. This reduces the risk of dizzy or fainting spells. Alcohol can make you more drowsy and dizzy. Avoid alcoholic drinks. Do not treat yourself for coughs, colds, or pain while you are taking this medicine without asking your doctor or health care professional for advice. Some ingredients may increase your blood pressure. What side effects may I notice from receiving this medicine? Side effects that you should report to your doctor or health care professional as soon as possible: -blurred vision -dry mouth -skin rash -sweating -the feeling of extreme pressure in the head -unusually weak or tired Side effects that usually do not require medical attention (report to your doctor or health care professional if they continue or are bothersome): -flushing of the face or neck -headache -irregular heartbeat, palpitations -nausea, vomiting This list may not describe all possible side effects. Call your doctor for medical advice about side effects. You may report side effects to FDA at 1-800-FDA-1088. Where should I keep my medicine? Keep out of the reach of children. Store at room temperature between 20 and 25 degrees C (68 and 77 degrees F). Store in [facility] [employer]. Protect from light and moisture. Keep tightly closed. Throw away any unused medicine after the expiration date. NOTE: This sheet  is a summary. It may not cover all possible information. If you have questions about this medicine, talk to your doctor, pharmacist, or health care provider.  2018 Elsevier/Gold Standard (2013-04-26 17:57:36)

## 2018-06-13 NOTE — Progress Notes (Signed)
Cardiology Office Note:    Date:  06/13/2018   ID:  Lindsey Payne, DOB 1950-09-25, MRN [id number]  PCP:  Marjie Skiff, MD  Cardiologist:  Jenean Lindau, MD   Referring MD: Marjie Skiff, MD    ASSESSMENT:    1. Coronary artery disease involving native coronary artery of native heart without angina pectoris   2. Mixed hyperlipidemia   3. Essential hypertension   4. Cigarette smoker    PLAN:    In order of problems listed above:  1. Secondary prevention stressed with the patient.  Importance of compliance with diet and medication stressed and she vocalized understanding. 2. Her blood pressure is stable.  Diet was discussed for dyslipidemia.  She is fasting and will have blood work today. 3. I spent 5 minutes with the patient discussing solely about smoking. Smoking cessation was counseled. I suggested to the patient also different medications and pharmacological interventions. Patient is keen to try stopping on its own at this time. He will get back to me if he needs any further assistance in this matter. 4. Importance of exercise and weight loss stressed and she is agreeable to pursue this aggressively. 5. Patient will be seen in follow-up appointment in 6 months or earlier if the patient has any concerns    Medication Adjustments/Labs and Tests Ordered: Current medicines are reviewed at length with the patient today.  Concerns regarding medicines are outlined above.  No orders of the defined types were placed in this encounter.  No orders of the defined types were placed in this encounter.    No chief complaint on file.    History of Present Illness:    Lindsey Payne is a 67 y.o. female.  Patient is a pleasant 67 year old female.  She has past medical history of coronary artery disease with no clear details.  She is undergone stenting twice in the past, essential hypertension, unfortunately active smoking.  She is accompanied by her niece.  She  mentions to me that she leads a sedentary lifestyle.  She takes her dog for a walk and such.  No chest pain orthopnea or PND.  She sees Dr. Wynonia Lawman in the past but is now transferring her care because he may not be available at least for the near future.  She has been very happy with his care in the past.  At the time of my evaluation, the patient is alert awake oriented and in no distress.   Past Medical History:  Diagnosis Date  . Adenoma of large intestine   . Arthritis   . Asthma   . Back pain   . CHF (congestive heart failure) ([place])   . Coronary artery disease   . Depression   . Ganglion cyst    rt wrist  . Hemorrhoids   . Hyperlipidemia   . Hypertension   . Loss of teeth due to extraction 09/29/2016  . Lung collapse 1984   left lung, no problems since then  . Transfusion history   . Tubular adenoma   . Wears glasses     Past Surgical History:  Procedure Laterality Date  . ABDOMINAL HYSTERECTOMY     has one ovary left  . BACK SURGERY     3 disc removed  . CESAREAN SECTION    . COLON SURGERY  1987   benign tubulovillous adenoma  . COLONOSCOPY N/A 07/22/2017   Procedure: COLONOSCOPY;  Surgeon: Alphonsa Overall, MD;  Location: [facility];  Service: General;  Laterality: N/A;  NO ANESTHESIA PER DR. Lucia Gaskins  . CORONARY ANGIOPLASTY  2005   w 2 stents  . GANGLION CYST EXCISION Right 10/07/2016   Procedure: Right wrist volar ganglion excision;  Surgeon: Iran Planas, MD;  Location: [facility];  Service: Orthopedics;  Laterality: Right;  . Heart stints     Dr. Wynonia Lawman  . LUNG SURGERY Left    for collasped lung  . TONSILLECTOMY    . TOTAL HIP ARTHROPLASTY Right 05/13/2015   Procedure: RIGHT TOTAL HIP ARTHROPLASTY ANTERIOR APPROACH;  Surgeon: Mcarthur Rossetti, MD;  Location: [facility];  Service: Orthopedics;  Laterality: Right;    Current Medications: Current Meds  Medication Sig  . aspirin 81 MG EC tablet Take 4 tablets (325 mg total) by mouth daily. (Patient  taking differently: Take 81 mg by mouth daily. )  . furosemide (LASIX) 40 MG tablet TAKE ONE-HALF TABLET BY  MOUTH TWO TIMES DAILY  . hydrALAZINE (APRESOLINE) 50 MG tablet Take 1 tablet (50 mg total) by mouth 2 (two) times daily.  . isosorbide dinitrate (ISORDIL) 20 MG tablet TAKE 1 TABLET BY MOUTH TWICE A DAY  . lisinopril (PRINIVIL,ZESTRIL) 20 MG tablet Take 1 tablet (20 mg total) by mouth daily.  . metoprolol succinate (TOPROL-XL) 25 MG 24 hr tablet TAKE 1 TABLET BY MOUTH TWO  TIMES DAILY  . oxymetazoline (AFRIN) 0.05 % nasal spray Place 1 spray into both nostrils 2 (two) times daily as needed for congestion.  Marland Kitchen PARoxetine (PAXIL) 40 MG tablet Take 1 tablet (40 mg total) by mouth every morning.  . potassium chloride SA (KLOR-CON M20) 20 MEQ tablet Take 1 tablet (20 mEq total) by mouth 2 (two) times daily.  . rosuvastatin (CRESTOR) 20 MG tablet TAKE 1 TABLET BY MOUTH  DAILY (Patient taking differently: TAKE 20 MG BY MOUTH  DAILY)  . spironolactone (ALDACTONE) 25 MG tablet Take 1 tablet (25 mg total) by mouth daily.     Allergies:   Valium [diazepam]   Social History   Socioeconomic History  . Marital status: Widowed    Spouse name: Not on file  . Number of children: 1  . Years of education: Not on file  . Highest education level: Not on file  Occupational History  . Occupation: disabled  Social Needs  . Financial resource strain: Not on file  . Food insecurity:    Worry: Not on file    Inability: Not on file  . Transportation needs:    Medical: Not on file    Non-medical: Not on file  Tobacco Use  . Smoking status: Current Every Day Smoker    Packs/day: 0.25    Years: 50.00    Pack years: 12.50    Types: Cigarettes    Start date: 07/12/1958  . Smokeless tobacco: Never Used  . Tobacco comment: smoking 0-3 cigs per day  Substance and Sexual Activity  . Alcohol use: No  . Drug use: No  . Sexual activity: Never  Lifestyle  . Physical activity:    Days per week: Not on file     Minutes per session: Not on file  . Stress: Not on file  Relationships  . Social connections:    Talks on phone: Not on file    Gets together: Not on file    Attends religious service: Not on file    Active member of club or organization: Not on file    Attends meetings of clubs or organizations: Not on file    Relationship  status: Not on file  Other Topics Concern  . Not on file  Social History Narrative  . Not on file     Family History: The patient's family history includes Breast cancer in her mother; Cancer (age of onset: 45) in her sister; Colon cancer (age of onset: 35) in her father; Diabetes in her father; Heart disease in her father and mother; Hypertension in her son.  ROS:   Please see the history of present illness.    All other systems reviewed and are negative.  EKGs/Labs/Other Studies Reviewed:    The following studies were reviewed today: EKG reveals sinus rhythm and nonspecific ST-T changes.  I reviewed Dr. Marguerita Beards records that were available to me extensively.   Recent Labs: 02/01/2018: ALT 23; BUN 12; Creatinine, Ser 1.00; Hemoglobin 12.3; Platelets 245; Potassium 4.6; Sodium 140  Recent Lipid Panel    Component Value Date/Time   CHOL 162 02/01/2018 0914   TRIG 170 (H) 02/01/2018 0914   HDL 48 02/01/2018 0914   CHOLHDL 3.4 02/01/2018 0914   CHOLHDL 7.0 (H) 07/08/2016 0858   VLDL 55 (H) 07/08/2016 0858   LDLCALC 80 02/01/2018 0914   LDLDIRECT 74 02/21/2017 0915   LDLDIRECT 135 (H) 05/01/2012 0918    Physical Exam:    VS:  BP 128/72 (BP Location: Right Arm, Patient Position: Sitting, Cuff Size: Normal)   Pulse 61   Ht 5\' 9"  (1.753 m)   Wt 213 lb (96.6 kg)   SpO2 99%   BMI 31.45 kg/m     Wt Readings from Last 3 Encounters:  06/13/18 213 lb (96.6 kg)  02/01/18 212 lb (96.2 kg)  10/21/17 211 lb (95.7 kg)     GEN: Patient is in no acute distress HEENT: Normal NECK: No JVD; No carotid bruits LYMPHATICS: No lymphadenopathy CARDIAC: Hear  sounds regular, 2/6 systolic murmur at the apex. RESPIRATORY:  Clear to auscultation without rales, wheezing or rhonchi  ABDOMEN: Soft, non-tender, non-distended MUSCULOSKELETAL:  No edema; No deformity  SKIN: Warm and dry NEUROLOGIC:  Alert and oriented x 3 PSYCHIATRIC:  Normal affect   Signed, Jenean Lindau, MD  06/13/2018 10:45 AM    [place]

## 2018-06-14 ENCOUNTER — Telehealth: Payer: Self-pay

## 2018-06-14 DIAGNOSIS — F17210 Nicotine dependence, cigarettes, uncomplicated: Secondary | ICD-10-CM

## 2018-06-14 DIAGNOSIS — I10 Essential (primary) hypertension: Secondary | ICD-10-CM

## 2018-06-14 DIAGNOSIS — E782 Mixed hyperlipidemia: Secondary | ICD-10-CM

## 2018-06-14 DIAGNOSIS — I2510 Atherosclerotic heart disease of native coronary artery without angina pectoris: Secondary | ICD-10-CM

## 2018-06-14 LAB — CBC WITH DIFFERENTIAL/PLATELET
BASOS ABS: 0.1 10*3/uL (ref 0.0–0.2)
Basos: 1 %
EOS (ABSOLUTE): 0.2 10*3/uL (ref 0.0–0.4)
Eos: 3 %
HEMOGLOBIN: 12.7 g/dL (ref 11.1–15.9)
Hematocrit: 38.2 % (ref 34.0–46.6)
Immature Grans (Abs): 0 10*3/uL (ref 0.0–0.1)
Immature Granulocytes: 0 %
LYMPHS ABS: 2.4 10*3/uL (ref 0.7–3.1)
Lymphs: 41 %
MCH: 27.5 pg (ref 26.6–33.0)
MCHC: 33.2 g/dL (ref 31.5–35.7)
MCV: 83 fL (ref 79–97)
MONOCYTES: 9 %
MONOS ABS: 0.5 10*3/uL (ref 0.1–0.9)
Neutrophils Absolute: 2.8 10*3/uL (ref 1.4–7.0)
Neutrophils: 46 %
PLATELETS: 251 10*3/uL (ref 150–450)
RBC: 4.62 x10E6/uL (ref 3.77–5.28)
RDW: 14.3 % (ref 12.3–15.4)
WBC: 6 10*3/uL (ref 3.4–10.8)

## 2018-06-14 LAB — LIPID PANEL
CHOLESTEROL TOTAL: 155 mg/dL (ref 100–199)
Chol/HDL Ratio: 3 ratio (ref 0.0–4.4)
HDL: 52 mg/dL (ref >39)
LDL CALC: 68 mg/dL (ref 0–99)
Triglycerides: 174 mg/dL — ABNORMAL HIGH (ref 0–149)
VLDL CHOLESTEROL CAL: 35 mg/dL (ref 5–40)

## 2018-06-14 LAB — HEPATIC FUNCTION PANEL
ALT: 27 IU/L (ref 0–32)
AST: 24 IU/L (ref 0–40)
Albumin: 4.5 g/dL (ref 3.6–4.8)
Alkaline Phosphatase: 99 IU/L (ref 39–117)
Bilirubin Total: 0.3 mg/dL (ref 0.0–1.2)
Bilirubin, Direct: 0.12 mg/dL (ref 0.00–0.40)
TOTAL PROTEIN: 6.6 g/dL (ref 6.0–8.5)

## 2018-06-14 LAB — BASIC METABOLIC PANEL
BUN / CREAT RATIO: 9 — AB (ref 12–28)
BUN: 10 mg/dL (ref 8–27)
CHLORIDE: 102 mmol/L (ref 96–106)
CO2: 24 mmol/L (ref 20–29)
Calcium: 10 mg/dL (ref 8.7–10.3)
Creatinine, Ser: 1.14 mg/dL — ABNORMAL HIGH (ref 0.57–1.00)
GFR calc Af Amer: 57 mL/min/{1.73_m2} — ABNORMAL LOW (ref >59)
GFR calc non Af Amer: 50 mL/min/{1.73_m2} — ABNORMAL LOW (ref >59)
GLUCOSE: 111 mg/dL — AB (ref 65–99)
POTASSIUM: 4.4 mmol/L (ref 3.5–5.2)
Sodium: 140 mmol/L (ref 134–144)

## 2018-06-14 LAB — TSH: TSH: 1.51 u[IU]/mL (ref 0.450–4.500)

## 2018-06-14 NOTE — Telephone Encounter (Signed)
Patient called and notified of lab results and will come back in 1 month for chem-7 recheck

## 2018-06-14 NOTE — Addendum Note (Signed)
Addended by: Tarri Glenn on: 06/14/2018 01:28 PM   Modules accepted: Orders

## 2018-06-14 NOTE — Addendum Note (Signed)
Addended by: Tarri Glenn on: 06/14/2018 04:56 PM   Modules accepted: Orders

## 2018-06-19 ENCOUNTER — Other Ambulatory Visit: Payer: Self-pay | Admitting: Family Medicine

## 2018-06-19 DIAGNOSIS — Z1231 Encounter for screening mammogram for malignant neoplasm of breast: Secondary | ICD-10-CM

## 2018-07-09 ENCOUNTER — Encounter (HOSPITAL_COMMUNITY): Payer: Self-pay | Admitting: Emergency Medicine

## 2018-07-09 ENCOUNTER — Ambulatory Visit (HOSPITAL_COMMUNITY)
Admission: EM | Admit: 2018-07-09 | Discharge: 2018-07-09 | Disposition: A | Discharge status: Home / Self Care | Payer: Medicare Other | Attending: Family Medicine | Admitting: Family Medicine

## 2018-07-09 DIAGNOSIS — B349 Viral infection, unspecified: Secondary | ICD-10-CM

## 2018-07-09 DIAGNOSIS — T148XXA Other injury of unspecified body region, initial encounter: Secondary | ICD-10-CM | POA: Diagnosis not present

## 2018-07-09 MED ORDER — OSELTAMIVIR PHOSPHATE 75 MG PO CAPS: 75.0000 mg | ORAL_CAPSULE | Freq: Two times a day (BID) | ORAL | 0 refills | Status: DC

## 2018-07-09 MED ORDER — PREDNISONE 50 MG PO TABS: ORAL_TABLET | ORAL | 0 refills | Status: DC

## 2018-07-09 MED ORDER — HYDROCODONE-HOMATROPINE 5-1.5 MG/5ML PO SYRP: 5.0000 mL | ORAL_SOLUTION | Freq: Four times a day (QID) | ORAL | 0 refills | Status: DC | PRN

## 2018-07-09 NOTE — ED Provider Notes (Signed)
[place]    CSN: [id number] Arrival date & time: 07/09/18  1023     History   Chief Complaint Chief Complaint  Patient presents with  . URI    HPI Lindsey Payne is a 67 y.o. female.   This is the initial visit for this 67 year old woman with cough.  She is here for evaluation of URI sx with cough and pain with cough and body aches   Patient developed sharp right sided pain that is present only when she moves.  She has had no urinary symptoms.  She has had no fever.  She has had a cough for the last day.  She received a flu shot earlier this fall.  Patient is having no shortness of breath, nausea, vomiting, abdominal pain, headache or dizziness.     Past Medical History:  Diagnosis Date  . Adenoma of large intestine   . Arthritis   . Asthma   . Back pain   . CHF (congestive heart failure) ([place])   . Coronary artery disease   . Depression   . Ganglion cyst    rt wrist  . Hemorrhoids   . Hyperlipidemia   . Hypertension   . Loss of teeth due to extraction 09/29/2016  . Lung collapse 1984   left lung, no problems since then  . Transfusion history   . Tubular adenoma   . Wears glasses     Patient Active Problem List   Diagnosis Date Noted  . Essential hypertension 06/13/2018  . CAD (coronary artery disease) 06/13/2018  . Cigarette smoker 06/13/2018  . Subcutaneous cyst 01/27/2016  . Elevated serum creatinine 06/23/2015  . Osteoarthritis of right hip 05/13/2015  . Status post total replacement of right hip 05/13/2015  . Lumbar pain with radiation down right leg 10/23/2014  . Ganglion cyst of wrist 10/23/2014  . Breast mass seen on mammogram 03/19/2013  . Rectal bleeding 01/30/2013  . Hemorrhoids 07/10/2012  . Health care maintenance 05/30/2012  . Back pain with R radiculopathy. 12/15/2011  . HERNIATED DISC 12/04/2009  . TOBACCO ABUSE 04/04/2007  . TUBULOVILLOUS ADENOMA, COLON, HX OF 11/17/2006  . Hyperlipidemia 09/08/2006  . Anxiety  state 09/08/2006  . DEPRESSIVE DISORDER, NOS 09/08/2006  . HYPERTENSION, BENIGN SYSTEMIC 09/08/2006  . CORONARY, ARTERIOSCLEROSIS 09/08/2006  . CARDIOMYOPATHY, ISCHEMIC 09/08/2006  . Chronic systolic heart failure ([place]) 09/08/2006    Past Surgical History:  Procedure Laterality Date  . ABDOMINAL HYSTERECTOMY     has one ovary left  . BACK SURGERY     3 disc removed  . CESAREAN SECTION    . COLON SURGERY  1987   benign tubulovillous adenoma  . COLONOSCOPY N/A 07/22/2017   Procedure: COLONOSCOPY;  Surgeon: Alphonsa Overall, MD;  Location: [facility];  Service: General;  Laterality: N/A;  NO ANESTHESIA PER DR. Lucia Gaskins  . CORONARY ANGIOPLASTY  2005   w 2 stents  . GANGLION CYST EXCISION Right 10/07/2016   Procedure: Right wrist volar ganglion excision;  Surgeon: Iran Planas, MD;  Location: [facility];  Service: Orthopedics;  Laterality: Right;  . Heart stints     Dr. Wynonia Lawman  . LUNG SURGERY Left    for collasped lung  . TONSILLECTOMY    . TOTAL HIP ARTHROPLASTY Right 05/13/2015   Procedure: RIGHT TOTAL HIP ARTHROPLASTY ANTERIOR APPROACH;  Surgeon: Mcarthur Rossetti, MD;  Location: [facility];  Service: Orthopedics;  Laterality: Right;    OB History   No obstetric history on file.  Home Medications    Prior to Admission medications   Medication Sig Start Date End Date Taking? Authorizing Provider  aspirin 81 MG EC tablet Take 4 tablets (325 mg total) by mouth daily. Patient taking differently: Take 81 mg by mouth daily.  12/12/15   Vivi Barrack, MD  furosemide (LASIX) 40 MG tablet TAKE ONE-HALF TABLET BY&nbsp;&nbsp;MOUTH TWO TIMES DAILY 08/29/17   Eloise Levels, MD  hydrALAZINE (APRESOLINE) 50 MG tablet Take 1 tablet (50 mg total) by mouth 2 (two) times daily. 10/10/17   Diallo, Earna Coder, MD  isosorbide dinitrate (ISORDIL) 20 MG tablet TAKE 1 TABLET BY MOUTH TWICE A DAY 08/30/17   Eloise Levels, MD  lisinopril (PRINIVIL,ZESTRIL) 20 MG tablet Take 1 tablet  (20 mg total) by mouth daily. 10/07/17   Diallo, Earna Coder, MD  metoprolol succinate (TOPROL-XL) 25 MG 24 hr tablet TAKE 1 TABLET BY MOUTH TWO  TIMES DAILY 05/22/18   Diallo, Earna Coder, MD  nitroGLYCERIN (NITROSTAT) 0.4 MG SL tablet Place 1 tablet (0.4 mg total) under the tongue every 5 (five) minutes as needed. 06/13/18 09/11/18  Revankar, Reita Cliche, MD  oxymetazoline (AFRIN) 0.05 % nasal spray Place 1 spray into both nostrils 2 (two) times daily as needed for congestion.    [provider]  PARoxetine (PAXIL) 40 MG tablet Take 1 tablet (40 mg total) by mouth every morning. 08/18/17   Eloise Levels, MD  potassium chloride SA (KLOR-CON M20) 20 MEQ tablet Take 1 tablet (20 mEq total) by mouth 2 (two) times daily. 08/29/17   Eloise Levels, MD  rosuvastatin (CRESTOR) 20 MG tablet TAKE 1 TABLET BY MOUTH  DAILY Patient taking differently: TAKE 20 MG BY MOUTH  DAILY 06/06/17   Eloise Levels, MD  spironolactone (ALDACTONE) 25 MG tablet Take 1 tablet (25 mg total) by mouth daily. 08/29/17   Eloise Levels, MD    Family History Family History  Problem Relation Age of Onset  . Breast cancer Mother   . Heart disease Mother   . Colon cancer Father 53       died  . Diabetes Father   . Heart disease Father   . Cancer Sister 50       Breast  . Hypertension Son     Social History Social History   Tobacco Use  . Smoking status: Current Every Day Smoker    Packs/day: 0.25    Years: 50.00    Pack years: 12.50    Types: Cigarettes    Start date: 07/12/1958  . Smokeless tobacco: Never Used  . Tobacco comment: smoking 0-3 cigs per day  Substance Use Topics  . Alcohol use: No  . Drug use: No     Allergies   Valium [diazepam]   Review of Systems Review of Systems   Physical Exam Triage Vital Signs ED Triage Vitals [07/09/18 1119]  Enc Vitals Group     BP 103/62     Pulse Rate 69     Resp 18     Temp 98.2 F (36.8 C)     Temp Source Oral     SpO2 99 %     Weight       Height      Head Circumference      Peak Flow      Pain Score      Pain Loc      Pain Edu?      Excl. in [place]?    No data found.  Updated Vital Signs BP 103/62 (BP Location: Right Arm)   Pulse 69   Temp 98.2 F (36.8 C) (Oral)   Resp 18   SpO2 99%   Physical Exam Vitals signs and nursing note reviewed.  Constitutional:      Appearance: Normal appearance. She is obese.  HENT:     Head: Normocephalic.     Right Ear: Tympanic membrane and external ear normal.     Left Ear: Tympanic membrane and external ear normal.     Nose: Congestion present.     Mouth/Throat:     Mouth: Mucous membranes are moist.     Pharynx: Oropharynx is clear.  Eyes:     Conjunctiva/sclera: Conjunctivae normal.     Pupils: Pupils are equal, round, and reactive to light.  Neck:     Musculoskeletal: Normal range of motion.  Cardiovascular:     Rate and Rhythm: Normal rate.     Heart sounds: Normal heart sounds.  Pulmonary:     Effort: Pulmonary effort is normal.     Breath sounds: Normal breath sounds.  Abdominal:     Palpations: Abdomen is soft.     Tenderness: There is no abdominal tenderness.  Musculoskeletal: Normal range of motion.     Comments: Patient has right lower rib pain when she twists or tries to get up out of her chair.  Otherwise there is no pain in that area.  Skin:    General: Skin is warm and dry.  Neurological:     General: No focal deficit present.     Mental Status: She is alert. She is disoriented.      UC Treatments / Results  Labs (all labs ordered are listed, but only abnormal results are displayed) Labs Reviewed - No data to display  EKG None  Radiology No results found.  Procedures Procedures (including critical care time)  Medications Ordered in UC Medications - No data to display  Initial Impression / Assessment and Plan / UC Course  I have reviewed the triage vital signs and the nursing notes.  Pertinent labs & imaging results that were  available during my care of the patient were reviewed by me and considered in my medical decision making (see chart for details).    Final Clinical Impressions(s) / UC Diagnoses   Final diagnoses:  None   Discharge Instructions   None    ED Prescriptions    None     Controlled Substance Prescriptions [place] Controlled Substance Registry consulted? Not Applicable   Robyn Haber, MD 07/09/18 1200

## 2018-07-09 NOTE — ED Triage Notes (Signed)
Pt here for URI sx with cough and pain with cough and body aches

## 2018-07-20 DIAGNOSIS — I10 Essential (primary) hypertension: Secondary | ICD-10-CM | POA: Diagnosis not present

## 2018-07-20 DIAGNOSIS — I2510 Atherosclerotic heart disease of native coronary artery without angina pectoris: Secondary | ICD-10-CM | POA: Diagnosis not present

## 2018-07-20 DIAGNOSIS — E782 Mixed hyperlipidemia: Secondary | ICD-10-CM | POA: Diagnosis not present

## 2018-07-21 ENCOUNTER — Telehealth: Payer: Self-pay

## 2018-07-21 ENCOUNTER — Other Ambulatory Visit: Payer: Self-pay | Admitting: Family Medicine

## 2018-07-21 LAB — BASIC METABOLIC PANEL
BUN / CREAT RATIO: 6 — AB (ref 12–28)
BUN: 7 mg/dL — ABNORMAL LOW (ref 8–27)
CO2: 23 mmol/L (ref 20–29)
Calcium: 9.4 mg/dL (ref 8.7–10.3)
Chloride: 103 mmol/L (ref 96–106)
Creatinine, Ser: 1.11 mg/dL — ABNORMAL HIGH (ref 0.57–1.00)
GFR calc Af Amer: 59 mL/min/{1.73_m2} — ABNORMAL LOW (ref >59)
GFR calc non Af Amer: 51 mL/min/{1.73_m2} — ABNORMAL LOW (ref >59)
GLUCOSE: 97 mg/dL (ref 65–99)
POTASSIUM: 4.7 mmol/L (ref 3.5–5.2)
SODIUM: 141 mmol/L (ref 134–144)

## 2018-07-21 MED ORDER — ROSUVASTATIN CALCIUM 20 MG PO TABS: 20.0000 mg | ORAL_TABLET | Freq: Every day | ORAL | 3 refills | Status: DC

## 2018-07-21 MED ORDER — SPIRONOLACTONE 25 MG PO TABS: 25.0000 mg | ORAL_TABLET | Freq: Every day | ORAL | 3 refills | Status: DC

## 2018-07-21 MED ORDER — PAROXETINE HCL 40 MG PO TABS: 40.0000 mg | ORAL_TABLET | Freq: Every morning | ORAL | 3 refills | Status: DC

## 2018-07-21 MED ORDER — POTASSIUM CHLORIDE CRYS ER 20 MEQ PO TBCR: 20.0000 meq | EXTENDED_RELEASE_TABLET | Freq: Two times a day (BID) | ORAL | 3 refills | Status: DC

## 2018-07-21 NOTE — Telephone Encounter (Signed)
Called patient and left a detailed voice message on patients phone regarding lab results.

## 2018-07-21 NOTE — Telephone Encounter (Signed)
-----   Message from Jenean Lindau, MD sent at 07/21/2018  8:16 AM EST ----- Labs are stab;e . [facility] pcp  Jenean Lindau, MD 07/21/2018 8:16 AM

## 2018-07-28 ENCOUNTER — Ambulatory Visit
Admission: RE | Admit: 2018-07-28 | Discharge: 2018-07-28 | Disposition: A | Discharge status: Home / Self Care | Payer: Medicare Other | Source: Ambulatory Visit | Attending: Family Medicine | Admitting: Family Medicine

## 2018-07-28 DIAGNOSIS — Z1231 Encounter for screening mammogram for malignant neoplasm of breast: Secondary | ICD-10-CM | POA: Diagnosis not present

## 2018-09-22 ENCOUNTER — Other Ambulatory Visit: Payer: Self-pay

## 2018-09-22 ENCOUNTER — Ambulatory Visit (INDEPENDENT_AMBULATORY_CARE_PROVIDER_SITE_OTHER): Payer: Medicare Other | Admitting: Family Medicine

## 2018-09-22 ENCOUNTER — Encounter: Payer: Self-pay | Admitting: Family Medicine

## 2018-09-22 DIAGNOSIS — F17210 Nicotine dependence, cigarettes, uncomplicated: Secondary | ICD-10-CM

## 2018-09-22 DIAGNOSIS — I10 Essential (primary) hypertension: Secondary | ICD-10-CM | POA: Diagnosis not present

## 2018-09-22 NOTE — Assessment & Plan Note (Signed)
Blood pressure today 119/63 patient is at goal.  Will continue current blood pressure regimen.  Blood work done in January did not show any electrolyte abnormalities. We will see patient in 3 months.

## 2018-09-22 NOTE — Assessment & Plan Note (Signed)
Patient is currently cutting down.  She is smoking 2 and half cigarettes a day down from 5.  Patient does not have a quit date.  Still in the pre-contemplative stage will assist with ready.

## 2018-09-22 NOTE — Progress Notes (Signed)
   Subjective:    Patient ID: Lindsey Payne, female    DOB: 1951/04/25, 68 y.o.   MRN: [id number]   CC:  HPI:   Smoking status reviewed   ROS: all other systems were reviewed and are negative other than in the HPI   Past Medical History:  Diagnosis Date  . Adenoma of large intestine   . Arthritis   . Asthma   . Back pain   . CHF (congestive heart failure) ([place])   . Coronary artery disease   . Depression   . Ganglion cyst    rt wrist  . Hemorrhoids   . Hyperlipidemia   . Hypertension   . Loss of teeth due to extraction 09/29/2016  . Lung collapse 1984   left lung, no problems since then  . Transfusion history   . Tubular adenoma   . Wears glasses     Past Surgical History:  Procedure Laterality Date  . ABDOMINAL HYSTERECTOMY     has one ovary left  . BACK SURGERY     3 disc removed  . CESAREAN SECTION    . COLON SURGERY  1987   benign tubulovillous adenoma  . COLONOSCOPY N/A 07/22/2017   Procedure: COLONOSCOPY;  Surgeon: Alphonsa Overall, MD;  Location: [facility];  Service: General;  Laterality: N/A;  NO ANESTHESIA PER DR. Lucia Gaskins  . CORONARY ANGIOPLASTY  2005   w 2 stents  . GANGLION CYST EXCISION Right 10/07/2016   Procedure: Right wrist volar ganglion excision;  Surgeon: Iran Planas, MD;  Location: [facility];  Service: Orthopedics;  Laterality: Right;  . Heart stints     Dr. Wynonia Lawman  . LUNG SURGERY Left    for collasped lung  . TONSILLECTOMY    . TOTAL HIP ARTHROPLASTY Right 05/13/2015   Procedure: RIGHT TOTAL HIP ARTHROPLASTY ANTERIOR APPROACH;  Surgeon: Mcarthur Rossetti, MD;  Location: [facility];  Service: Orthopedics;  Laterality: Right;    Past medical history, surgical, family, and social history reviewed and updated in the EMR as appropriate.  Objective:  BP 119/63   Pulse 68   Temp 97.7 F (36.5 C) (Oral)   Ht 5\' 8"  (1.727 m)   Wt 211 lb (95.7 kg)   SpO2 98%   BMI 32.08 kg/m   Vitals and nursing note  reviewed  General: NAD, pleasant, able to participate in exam Cardiac: RRR, normal heart sounds, no murmurs. 2+ radial and PT pulses bilaterally Respiratory: CTAB, normal effort, No wheezes, rales or rhonchi Abdomen: soft, nontender, nondistended, no hepatic or splenomegaly, +BS Extremities: no edema or cyanosis. WWP. Skin: warm and dry, no rashes noted Neuro: alert and oriented x4, no focal deficits Psych: Normal affect and mood   Assessment & Plan:    No problem-specific Assessment & Plan notes found for this encounter.    Marjie Skiff, MD [place] PGY-3

## 2018-09-22 NOTE — Progress Notes (Signed)
Subjective:    Patient ID: Lindsey Payne, female    DOB: 1951/03/08, 68 y.o.   MRN: [id number]   CC: Follow-up for hypertension  HPI: Patient is a 68 year old female with past medical history significant for hypertension, CHF, osteoarthritis, hyperlipidemia, tobacco use, depression who presents today to follow-up on hypertension.  Patient was recently seen by cardiology in December reports that everything was going well.  She is currently taking all her medication as instructed.  She denies any recent illness.  Patient is still smoking 2 and half cigarettes a day but has cut down from 5 cigarettes a day.  Patient is also trying to lose weight.  She is thinking about joining [place] on under diet and exercise plan.  She has silver sneakers but does not like to be around people when she works out.  She still does not have a quit date but is slowly working towards quitting completely.  She has tried nicotine gums but did not like them.  She currently denies any chest pain, shortness of breath, throat pain, nausea, vomiting, fever, chills, dizziness.  Smoking status reviewed   ROS: all other systems were reviewed and are negative other than in the HPI   Past Medical History:  Diagnosis Date   Adenoma of large intestine    Arthritis    Asthma    Back pain    CHF (congestive heart failure) ([facility])    Coronary artery disease    Depression    Ganglion cyst    rt wrist   Hemorrhoids    Hyperlipidemia    Hypertension    Loss of teeth due to extraction 09/29/2016   Lung collapse 1984   left lung, no problems since then   Transfusion history    Tubular adenoma    Wears glasses     Past Surgical History:  Procedure Laterality Date   ABDOMINAL HYSTERECTOMY     has one ovary left   BACK SURGERY     3 disc removed   CESAREAN SECTION     COLON SURGERY  1987   benign tubulovillous adenoma   COLONOSCOPY N/A 07/22/2017   Procedure: COLONOSCOPY;  Surgeon:  Alphonsa Overall, MD;  Location: [facility];  Service: General;  Laterality: N/A;  NO ANESTHESIA PER DR. Lucia Gaskins   CORONARY ANGIOPLASTY  2005   w 2 stents   GANGLION CYST EXCISION Right 10/07/2016   Procedure: Right wrist volar ganglion excision;  Surgeon: Iran Planas, MD;  Location: [facility];  Service: Orthopedics;  Laterality: Right;   Heart stints     Dr. Wynonia Lawman   LUNG SURGERY Left    for collasped lung   TONSILLECTOMY     TOTAL HIP ARTHROPLASTY Right 05/13/2015   Procedure: RIGHT TOTAL HIP ARTHROPLASTY ANTERIOR APPROACH;  Surgeon: Mcarthur Rossetti, MD;  Location: [facility];  Service: Orthopedics;  Laterality: Right;    Past medical history, surgical, family, and social history reviewed and updated in the EMR as appropriate.  Objective:  BP 119/63    Pulse 68    Temp 97.7 F (36.5 C) (Oral)    Ht 5\' 8"  (1.727 m)    Wt 211 lb (95.7 kg)    SpO2 98%    BMI 32.08 kg/m   Vitals and nursing note reviewed  General: NAD, pleasant, able to participate in exam Cardiac: RRR, normal heart sounds, no murmurs. 2+ radial and PT pulses bilaterally Respiratory: CTAB, normal effort, No wheezes, rales or rhonchi Abdomen: soft, nontender,  nondistended, no hepatic or splenomegaly, +BS Extremities: no edema or cyanosis. WWP. Skin: warm and dry, no rashes noted Neuro: alert and oriented x4, no focal deficits Psych: Normal affect and mood   Assessment & Plan:   HYPERTENSION, BENIGN SYSTEMIC Blood pressure today 119/63 patient is at goal.  Will continue current blood pressure regimen.  Blood work done in January did not show any electrolyte abnormalities. We will see patient in 3 months.  Cigarette smoker Patient is currently cutting down.  She is smoking 2 and half cigarettes a day down from 5.  Patient does not have a quit date.  Still in the pre-contemplative stage will assist with ready.    Marjie Skiff, MD [place] PGY-3

## 2018-10-12 ENCOUNTER — Other Ambulatory Visit: Payer: Self-pay

## 2018-10-12 MED ORDER — ISOSORBIDE DINITRATE 20 MG PO TABS: ORAL_TABLET | ORAL | 3 refills | Status: DC

## 2018-10-12 MED ORDER — FUROSEMIDE 40 MG PO TABS: ORAL_TABLET | ORAL | 3 refills | Status: DC

## 2018-10-12 MED ORDER — HYDRALAZINE HCL 50 MG PO TABS: 50.0000 mg | ORAL_TABLET | Freq: Two times a day (BID) | ORAL | 3 refills | Status: DC

## 2018-11-17 ENCOUNTER — Other Ambulatory Visit: Payer: Self-pay

## 2018-11-17 MED ORDER — LISINOPRIL 20 MG PO TABS: 20.0000 mg | ORAL_TABLET | Freq: Every day | ORAL | 5 refills | Status: DC

## 2019-01-09 ENCOUNTER — Ambulatory Visit (INDEPENDENT_AMBULATORY_CARE_PROVIDER_SITE_OTHER): Payer: Medicare Other

## 2019-01-09 ENCOUNTER — Other Ambulatory Visit: Payer: Self-pay

## 2019-01-09 VITALS — BP 136/78 | HR 59 | Temp 97.9°F | Ht 67.9 in | Wt 207.0 lb

## 2019-01-09 DIAGNOSIS — Z0000 Encounter for general adult medical examination without abnormal findings: Secondary | ICD-10-CM

## 2019-01-09 NOTE — Patient Instructions (Addendum)
You spoke to Lindsey Payne, [place] for your annual wellness visit.  We discussed goals: Goals    . Blood Pressure < 140/90    . LDL CALC < 100    . Quit smoking / using tobacco     Patient is smoking ~3 cigarettes a day. Patient does want to quit all together.      . Weight (lb) < 192 lb (87.1 kg)     7% weight loss       We also discussed recommended health maintenance. Looks like you are up to date with everything! You will be due back in August for your annual Flu vaccine! Your new PCP is Dr. Matilde Haymaker. You will establish care with him in August.  Health Maintenance  Topic Date Due  . INFLUENZA VACCINE  02/10/2019  . MAMMOGRAM  07/28/2020  . TETANUS/TDAP  05/01/2022  . COLONOSCOPY  07/22/2022  . DEXA SCAN  Completed  . Hepatitis C Screening  Completed  . PNA vac Low Risk Adult  Completed    We also discussed quitting smoking. Please read over the materials I have provided for you. Take care!    Coping with Quitting Smoking  Quitting smoking is a physical and mental challenge. You will face cravings, withdrawal symptoms, and temptation. Before quitting, work with your health care provider to make a plan that can help you cope. Preparation can help you quit and keep you from giving in. How can I cope with cravings? Cravings usually last for 5-10 minutes. If you get through it, the craving will pass. Consider taking the following actions to help you cope with cravings:  Keep your mouth busy: ? Chew sugar-free gum. ? Suck on hard candies or a straw. ? Brush your teeth.  Keep your hands and body busy: ? Immediately change to a different activity when you feel a craving. ? Squeeze or play with a ball. ? Do an activity or a hobby, like making bead jewelry, practicing needlepoint, or working with wood. ? Mix up your normal routine. ? Take a short exercise break. Go for a quick walk or run up and down stairs. ? Spend time in public places where smoking is not allowed.  Focus  on doing something kind or helpful for someone else.  Call a friend or family member to talk during a craving.  Join a support group.  Call a quit line, such as 1-800-QUIT-NOW.  Talk with your health care provider about medicines that might help you cope with cravings and make quitting easier for you. How can I deal with withdrawal symptoms? Your body may experience negative effects as it tries to get used to not having nicotine in the system. These effects are called withdrawal symptoms. They may include:  Feeling hungrier than normal.  Trouble concentrating.  Irritability.  Trouble sleeping.  Feeling depressed.  Restlessness and agitation.  Craving a cigarette. To manage withdrawal symptoms:  Avoid places, people, and activities that trigger your cravings.  Remember why you want to quit.  Get plenty of sleep.  Avoid coffee and other caffeinated drinks. These may worsen some of your symptoms. How can I handle social situations? Social situations can be difficult when you are quitting smoking, especially in the first few weeks. To manage this, you can:  Avoid parties, bars, and other social situations where people might be smoking.  Avoid alcohol.  Leave right away if you have the urge to smoke.  Explain to your family and friends that  you are quitting smoking. Ask for understanding and support.  Plan activities with friends or family where smoking is not an option. What are some ways I can cope with stress? Wanting to smoke may cause stress, and stress can make you want to smoke. Find ways to manage your stress. Relaxation techniques can help. For example:  Breathe slowly and deeply, in through your nose and out through your mouth.  Listen to soothing, relaxing music.  Talk with a family member or friend about your stress.  Light a candle.  Soak in a bath or take a shower.  Think about a peaceful place. What are some ways I can prevent weight gain? Be  aware that many people gain weight after they quit smoking. However, not everyone does. To keep from gaining weight, have a plan in place before you quit and stick to the plan after you quit. Your plan should include:  Having healthy snacks. When you have a craving, it may help to: ? Eat plain popcorn, crunchy carrots, celery, or other cut vegetables. ? Chew sugar-free gum.  Changing how you eat: ? Eat small portion sizes at meals. ? Eat 4-6 small meals throughout the day instead of 1-2 large meals a day. ? Be mindful when you eat. Do not watch television or do other things that might distract you as you eat.  Exercising regularly: ? Make time to exercise each day. If you do not have time for a long workout, do short bouts of exercise for 5-10 minutes several times a day. ? Do some form of strengthening exercise, like weight lifting, and some form of aerobic exercise, like running or swimming.  Drinking plenty of water or other low-calorie or no-calorie drinks. Drink 6-8 glasses of water daily, or as much as instructed by your health care provider. Summary  Quitting smoking is a physical and mental challenge. You will face cravings, withdrawal symptoms, and temptation to smoke again. Preparation can help you as you go through these challenges.  You can cope with cravings by keeping your mouth busy (such as by chewing gum), keeping your body and hands busy, and making calls to family, friends, or a helpline for people who want to quit smoking.  You can cope with withdrawal symptoms by avoiding places where people smoke, avoiding drinks with caffeine, and getting plenty of rest.  Ask your health care provider about the different ways to prevent weight gain, avoid stress, and handle social situations. This information is not intended to replace advice given to you by your health care provider. Make sure you discuss any questions you have with your health care provider. Document Released:  06/25/2016 Document Revised: 06/10/2017 Document Reviewed: 06/25/2016 Elsevier Patient Education  2020 [place] 65 Years and Older, Female Preventive care refers to lifestyle choices and visits with your health care provider that can promote health and wellness. This includes:  A yearly physical exam. This is also called an annual well check.  Regular dental and eye exams.  Immunizations.  Screening for certain conditions.  Healthy lifestyle choices, such as diet and exercise. What can I expect for my preventive care visit? Physical exam Your health care provider will check:  Height and weight. These may be used to calculate body mass index (BMI), which is a measurement that tells if you are at a healthy weight.  Heart rate and blood pressure.  Your skin for abnormal spots. Counseling Your health care provider may ask you questions about:  Alcohol, tobacco, and drug use.  Emotional well-being.  Home and relationship well-being.  Sexual activity.  Eating habits.  History of falls.  Memory and ability to understand (cognition).  Work and work Statistician.  Pregnancy and menstrual history. What immunizations do I need?  Influenza (flu) vaccine  This is recommended every year. Tetanus, diphtheria, and pertussis (Tdap) vaccine  You may need a Td booster every 10 years. Varicella (chickenpox) vaccine  You may need this vaccine if you have not already been vaccinated. Zoster (shingles) vaccine  You may need this after age 36. Pneumococcal conjugate (PCV13) vaccine  One dose is recommended after age 58. Pneumococcal polysaccharide (PPSV23) vaccine  One dose is recommended after age 38. Measles, mumps, and rubella (MMR) vaccine  You may need at least one dose of MMR if you were born in 1957 or later. You may also need a second dose. Meningococcal conjugate (MenACWY) vaccine  You may need this if you have certain  conditions. Hepatitis A vaccine  You may need this if you have certain conditions or if you travel or work in places where you may be exposed to hepatitis A. Hepatitis B vaccine  You may need this if you have certain conditions or if you travel or work in places where you may be exposed to hepatitis B. Haemophilus influenzae type b (Hib) vaccine  You may need this if you have certain conditions. You may receive vaccines as individual doses or as more than one vaccine together in one shot (combination vaccines). Talk with your health care provider about the risks and benefits of combination vaccines. What tests do I need? Blood tests  Lipid and cholesterol levels. These may be checked every 5 years, or more frequently depending on your overall health.  Hepatitis C test.  Hepatitis B test. Screening  Lung cancer screening. You may have this screening every year starting at age 53 if you have a 30-pack-year history of smoking and currently smoke or have quit within the past 15 years.  Colorectal cancer screening. All adults should have this screening starting at age 68 and continuing until age 15. Your health care provider may recommend screening at age 29 if you are at increased risk. You will have tests every 1-10 years, depending on your results and the type of screening test.  Diabetes screening. This is done by checking your blood sugar (glucose) after you have not eaten for a while (fasting). You may have this done every 1-3 years.  Mammogram. This may be done every 1-2 years. Talk with your health care provider about how often you should have regular mammograms.  BRCA-related cancer screening. This may be done if you have a family history of breast, ovarian, tubal, or peritoneal cancers. Other tests  Sexually transmitted disease (STD) testing.  Bone density scan. This is done to screen for osteoporosis. You may have this done starting at age 74. Follow these instructions at  home: Eating and drinking  Eat a diet that includes fresh fruits and vegetables, whole grains, lean protein, and low-fat dairy products. Limit your intake of foods with high amounts of sugar, saturated fats, and salt.  Take vitamin and mineral supplements as recommended by your health care provider.  Do not drink alcohol if your health care provider tells you not to drink.  If you drink alcohol: ? Limit how much you have to 0-1 drink a day. ? Be aware of how much alcohol is in your drink. In the [place] one drink equals  one 12 oz bottle of beer (355 mL), one 5 oz glass of wine (148 mL), or one 1 oz glass of hard liquor (44 mL). Lifestyle  Take daily care of your teeth and gums.  Stay active. Exercise for at least 30 minutes on 5 or more days each week.  Do not use any products that contain nicotine or tobacco, such as cigarettes, e-cigarettes, and chewing tobacco. If you need help quitting, ask your health care provider.  If you are sexually active, practice safe sex. Use a condom or other form of protection in order to prevent STIs (sexually transmitted infections).  Talk with your health care provider about taking a low-dose aspirin or statin. What's next?  Go to your health care provider once a year for a well check visit.  Ask your health care provider how often you should have your eyes and teeth checked.  Stay up to date on all vaccines. This information is not intended to replace advice given to you by your health care provider. Make sure you discuss any questions you have with your health care provider. Document Released: 07/25/2015 Document Revised: 06/22/2018 Document Reviewed: 06/22/2018 Elsevier Patient Education  2020 [place] clinic's number is [phone]. Please call with questions or concerns about what we discussed today.

## 2019-01-09 NOTE — Progress Notes (Signed)
Subjective:   Lindsey Payne is a 68 y.o. female who presents for Medicare Annual (Subsequent) preventive examination.  Review of Systems: Defer to PCP   Cardiac Risk Factors include: advanced age (>78men, >60 women)     Objective:     Vitals: BP 136/78   Pulse (!) 59   Temp 97.9 F (36.6 C) (Oral)   Ht 5' 7.9" (1.725 m)   Wt 207 lb (93.9 kg)   SpO2 98%   BMI 31.57 kg/m   Body mass index is 31.57 kg/m.  Advanced Directives 01/09/2019 09/22/2018 02/01/2018 10/21/2017 07/22/2017 10/07/2016 10/01/2016  Does Patient Have a Medical Advance Directive? Yes No No No No No No  Type of Advance Directive [place]  Does patient want to make changes to medical advance directive? No - Patient declined - - - - - -  Copy of [place] in Chart? No - copy requested - - - - - -  Would patient like information on creating a medical advance directive? - No - Patient declined No - Patient declined No - Patient declined No - Patient declined No - Patient declined No - Patient declined    Tobacco Social History   Tobacco Use  Smoking Status Current Every Day Smoker  . Packs/day: 0.25  . Years: 50.00  . Pack years: 12.50  . Types: Cigarettes  . Start date: 07/12/1958  Smokeless Tobacco Never Used  Tobacco Comment   smoking 0-3 cigs per day     Ready to quit: Yes Counseling given: Yes Comment: smoking 0-3 cigs per day   Clinical Intake:  Pre-visit preparation completed: Yes  Pain Score: 0-No pain  How often do you need to have someone help you when you read instructions, pamphlets, or other written materials from your doctor or pharmacy?: 2 - Rarely What is the last grade level you completed in school?: high school  Interpreter Needed?: No   Past Medical History:  Diagnosis Date  . Adenoma of large intestine   . Anxiety   . Arthritis    right hand   . Asthma   . Back pain   . CHF (congestive heart failure) ([facility])    . Coronary artery disease   . Depression   . Ganglion cyst    rt wrist  . Hemorrhoids   . Hyperlipidemia   . Hypertension   . Loss of teeth due to extraction 09/29/2016  . Lung collapse 1984   left lung, no problems since then  . Transfusion history   . Tubular adenoma   . Wears glasses    Past Surgical History:  Procedure Laterality Date  . ABDOMINAL HYSTERECTOMY     has one ovary left  . BACK SURGERY     3 disc removed  . CESAREAN SECTION     1983  . COLON SURGERY  1987   benign tubulovillous adenoma  . COLONOSCOPY N/A 07/22/2017   Procedure: COLONOSCOPY;  Surgeon: Alphonsa Overall, MD;  Location: [facility];  Service: General;  Laterality: N/A;  NO ANESTHESIA PER DR. Lucia Gaskins  . CORONARY ANGIOPLASTY  2005   w 2 stents  . GANGLION CYST EXCISION Right 10/07/2016   Procedure: Right wrist volar ganglion excision;  Surgeon: Iran Planas, MD;  Location: [facility];  Service: Orthopedics;  Laterality: Right;  . Heart stints     Dr. Wynonia Lawman  . LUNG SURGERY Left    for collasped lung  .  TONSILLECTOMY    . TOTAL HIP ARTHROPLASTY Right 05/13/2015   Procedure: RIGHT TOTAL HIP ARTHROPLASTY ANTERIOR APPROACH;  Surgeon: Mcarthur Rossetti, MD;  Location: [facility];  Service: Orthopedics;  Laterality: Right;   Family History  Problem Relation Age of Onset  . Breast cancer Mother   . Heart disease Mother   . Colon cancer Father 1       died  . Diabetes Father   . Heart disease Father   . Cancer Sister 65       Breast  . Hypertension Son   . Stroke Maternal Grandmother   . Heart disease Maternal Grandfather    Social History   Socioeconomic History  . Marital status: Significant Other    Spouse name: Laverna Peace  . Number of children: 1  . Years of education: 59  . Highest education level: High school graduate  Occupational History  . 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 Financial resource strain: Not hard at all  .  Food insecurity    Worry: Never true    Inability: Never true  . Transportation needs    Medical: No    Non-medical: No  Tobacco Use  . Smoking status: Current Every Day Smoker    Packs/day: 0.25    Years: 50.00    Pack years: 12.50    Types: Cigarettes    Start date: 07/12/1958  . Smokeless tobacco: Never Used  . Tobacco comment: smoking 0-3 cigs per day  Substance and Sexual Activity  . Alcohol use: Yes    Alcohol/week: 1.0 standard drinks    Types: 1 Cans of beer per week  . Drug use: No  . Sexual activity: Not Currently    Birth control/protection: Surgical  Lifestyle  . Physical activity    Days per week: 7 days    Minutes per session: 20 min  . Stress: Not at all  Relationships  . Social connections    Talks on phone: More than three times a week    Gets together: More than three times a week    Attends religious service: 1 to 4 times per year    Active member of club or organization: No    Attends meetings of clubs or organizations: Never    Relationship status: Living with partner  Other Topics Concern  . Not on file  Social History Narrative   Patient lives with her significant other Laverna Peace. Patient is very close with her Son Moncure. Patients son takes care of her finances for her, and has recently paid off her home and bought her a car. The patient reports she enjoys spending time with her son and her dog Chance.     Outpatient Encounter Medications as of 01/09/2019  Medication Sig  . aspirin 81 MG EC tablet Take 4 tablets (325 mg total) by mouth daily. (Patient taking differently: Take 81 mg by mouth daily. )  . furosemide (LASIX) 40 MG tablet TAKE ONE-HALF TABLET BY  MOUTH TWO TIMES DAILY  . hydrALAZINE (APRESOLINE) 50 MG tablet Take 1 tablet (50 mg total) by mouth 2 (two) times daily.  . isosorbide dinitrate (ISORDIL) 20 MG tablet TAKE 1 TABLET BY MOUTH TWICE A DAY  . lisinopril (ZESTRIL) 20 MG tablet Take 1 tablet (20 mg total) by mouth daily.  . metoprolol  succinate (TOPROL-XL) 25 MG 24 hr tablet TAKE 1 TABLET BY MOUTH TWO  TIMES DAILY  . oxymetazoline (AFRIN) 0.05 %  nasal spray Place 1 spray into both nostrils 2 (two) times daily as needed for congestion.  Marland Kitchen PARoxetine (PAXIL) 40 MG tablet Take 1 tablet (40 mg total) by mouth every morning.  . potassium chloride SA (KLOR-CON M20) 20 MEQ tablet Take 1 tablet (20 mEq total) by mouth 2 (two) times daily.  . rosuvastatin (CRESTOR) 20 MG tablet Take 1 tablet (20 mg total) by mouth daily.  Marland Kitchen spironolactone (ALDACTONE) 25 MG tablet Take 1 tablet (25 mg total) by mouth daily.  . nitroGLYCERIN (NITROSTAT) 0.4 MG SL tablet Place 1 tablet (0.4 mg total) under the tongue every 5 (five) minutes as needed.  . [DISCONTINUED] HYDROcodone-homatropine (HYDROMET) 5-1.5 MG/5ML syrup Take 5 mLs by mouth every 6 (six) hours as needed for cough.  . [DISCONTINUED] oseltamivir (TAMIFLU) 75 MG capsule Take 1 capsule (75 mg total) by mouth every 12 (twelve) hours.  . [DISCONTINUED] predniSONE (DELTASONE) 50 MG tablet One daily with food   No facility-administered encounter medications on file as of 01/09/2019.     Activities of Daily Living In your present state of health, do you have any difficulty performing the following activities: 01/09/2019 01/09/2019  Hearing? - N  Vision? - Y  Comment - wears glasses  Difficulty concentrating or making decisions? - N  Walking or climbing stairs? - N  Dressing or bathing? - N  Doing errands, shopping? - N  Conservation officer, nature and eating ? - N  Using the Toilet? - N  In the past six months, have you accidently leaked urine? - N  Do you have problems with loss of bowel control? - N  Managing your Medications? - N  Managing your Finances? Tempie Donning  Comment Son manages finances Son keeps up with fiances  Housekeeping or managing your Housekeeping? - N  Some recent data might be hidden    Patient Care Team: Marjie Skiff, MD as PCP - General (Family Medicine)    Assessment:    This is a routine wellness examination for [place].  Exercise Activities and Dietary recommendations Current Exercise Habits: The patient does not participate in regular exercise at present, Exercise limited by: orthopedic condition(s)  The patient does report she walks to her mailbox daily.   Goals    . LDL CALC < 100    . Quit smoking / using tobacco     Patient is smoking ~3 cigarettes a day. Patient does want to quit all together.      . Weight (lb) < 192 lb (87.1 kg)       Fall Risk Fall Risk  01/09/2019 09/22/2018 10/21/2017 07/01/2017 07/08/2016  Falls in the past year? 0 0 No No No  Risk for fall due to : - - - - -  Risk for fall due to: Comment - - - - -   Is the patient's home free of loose throw rugs in walkways, pet beds, electrical cords, etc?   yes      Grab bars in the bathroom? no      Handrails on the stairs?   yes      Adequate lighting?   yes  Patient rating of health (0-10) scale: 8   Depression Screen PHQ 2/9 Scores 01/09/2019 09/22/2018 02/01/2018 10/21/2017  PHQ - 2 Score 0 0 0 0  PHQ- 9 Score - - - -     Cognitive Function   6CIT Screen 01/09/2019  What Year? 0 points  What month? 0 points  What time? 0 points  Count  back from 20 0 points  Months in reverse 4 points  Repeat phrase 0 points  Total Score 4    Immunization History  Administered Date(s) Administered  . Influenza Split 03/07/2012  . Influenza Whole 05/03/2007, 04/17/2008, 05/21/2008, 03/25/2009  . Influenza,inj,Quad PF,6+ Mos 03/09/2016, 04/11/2017  . Influenza-Unspecified 03/17/2011, 04/06/2013, 04/11/2014, 05/11/2015  . Pneumococcal Conjugate-13 07/08/2016  . Pneumococcal Polysaccharide-23 07/27/2007, 05/15/2015  . Td 07/12/2001  . Tdap 05/01/2012    Screening Tests Health Maintenance  Topic Date Due  . INFLUENZA VACCINE  02/10/2019  . MAMMOGRAM  07/28/2020  . TETANUS/TDAP  05/01/2022  . COLONOSCOPY  07/22/2022  . DEXA SCAN  Completed  . Hepatitis C Screening  Completed   . PNA vac Low Risk Adult  Completed    Cancer Screenings: Lung: Low Dose CT Chest recommended if Age 88-80 years, 30 pack-year currently smoking OR have quit w/in 15years. Patient does qualify. Breast:  Up to date on Mammogram? No   Up to date of Bone Density/Dexa? No Colorectal: Completed  Additional Screenings: Hepatitis C Screening: Completed      Plan:   Keep up the good work walking daily! Continue to try to quit smoking.   I have personally reviewed and noted the following in the patient's chart:   . Medical and social history . Use of alcohol, tobacco or illicit drugs  . Current medications and supplements . Functional ability and status . Nutritional status . Physical activity . Advanced directives . List of other physicians . Hospitalizations, surgeries, and ER visits in previous 12 months . Vitals . Screenings to include cognitive, depression, and falls . Referrals and appointments  In addition, I have reviewed and discussed with patient certain preventive protocols, quality metrics, and best practice recommendations. A written personalized care plan for preventive services as well as general preventive health recommendations were provided to patient.  Dorna Bloom, [place]  01/09/2019

## 2019-01-09 NOTE — Progress Notes (Signed)
I have reviewed this visit and agree with the documentation  Marjie Skiff, MD [place], PGY-3

## 2019-01-10 ENCOUNTER — Telehealth: Payer: Self-pay | Admitting: Cardiology

## 2019-01-10 NOTE — Telephone Encounter (Signed)
° ° °1. 3. Confirm consent - "In the setting of the current Covid19 crisis, you are scheduled for a (phone or video) visit with your provider on (date) at (time).  Just as we do with many in-office visits, in order for you to participate in this visit, we must obtain consent.  If you'd like, I can send this to your mychart (if signed up) or email for you to review.  Otherwise, I can obtain your verbal consent now.  All virtual visits are billed to your insurance company just like a normal visit would be.  By agreeing to a virtual visit, we'd like you to understand that the technology does not allow for your provider to perform an examination, and thus may limit your provider's ability to fully assess your condition. If your provider identifies any concerns that need to be evaluated in person, we will make arrangements to do so.  Finally, though the technology is pretty good, we cannot assure that it will always work on either your or our end, and in the setting of a video visit, we may have to convert it to a phone-only visit.  In either situation, we cannot ensure that we have a secure connection.  Are you willing to proceed?" STAFF: Did the patient verbally acknowledge consent to telehealth visit? Document YES/NO here:  Yes ° ° ° ° ° ° °FULL LENGTH CONSENT FOR TELE-HEALTH VISIT  ° °I hereby voluntarily request, consent and authorize [facility] and its employed or contracted physicians, physician assistants, nurse practitioners or other licensed health care professionals (the Practitioner), to provide me with telemedicine health care services (the “Services") as deemed necessary by the treating Practitioner. I acknowledge and consent to receive the Services by the Practitioner via telemedicine. I understand that the telemedicine visit will involve communicating with the Practitioner through live audiovisual communication technology and the disclosure of certain medical information by electronic transmission. I  acknowledge that I have been given the opportunity to request an in-person assessment or other available alternative prior to the telemedicine visit and am voluntarily participating in the telemedicine visit. ° °I understand that I have the right to withhold or withdraw my consent to the use of telemedicine in the course of my care at any time, without affecting my right to future care or treatment, and that the Practitioner or I may terminate the telemedicine visit at any time. I understand that I have the right to inspect all information obtained and/or recorded in the course of the telemedicine visit and may receive copies of available information for a reasonable fee.  I understand that some of the potential risks of receiving the Services via telemedicine include:  °• Delay or interruption in medical evaluation due to technological equipment failure or disruption; °• Information transmitted may not be sufficient (e.g. poor resolution of images) to allow for appropriate medical decision making by the Practitioner; and/or  °• In rare instances, security protocols could fail, causing a breach of personal health information. ° °Furthermore, I acknowledge that it is my responsibility to provide information about my medical history, conditions and care that is complete and accurate to the best of my ability. I acknowledge that Practitioner's advice, recommendations, and/or decision may be based on factors not within their control, such as incomplete or inaccurate data provided by me or distortions of diagnostic images or specimens that may result from electronic transmissions. I understand that the practice of medicine is not an exact science and that Practitioner makes no warranties   or guarantees regarding treatment outcomes. I acknowledge that I will receive a copy of this consent concurrently upon execution via email to the email address I last provided but may also request a printed copy by calling the office of  [facility].   ° °I understand that my insurance will be billed for this visit.  ° °I have read or had this consent read to me. °• I understand the contents of this consent, which adequately explains the benefits and risks of the Services being provided via telemedicine.  °• I have been provided ample opportunity to ask questions regarding this consent and the Services and have had my questions answered to my satisfaction. °• I give my informed consent for the services to be provided through the use of telemedicine in my medical care ° °By participating in this telemedicine visit I agree to the above. ° °

## 2019-01-11 ENCOUNTER — Telehealth (INDEPENDENT_AMBULATORY_CARE_PROVIDER_SITE_OTHER): Payer: Medicare Other | Admitting: Cardiology

## 2019-01-11 ENCOUNTER — Encounter: Payer: Self-pay | Admitting: Cardiology

## 2019-01-11 VITALS — BP 135/60 | Wt 209.0 lb

## 2019-01-11 DIAGNOSIS — I10 Essential (primary) hypertension: Secondary | ICD-10-CM | POA: Diagnosis not present

## 2019-01-11 DIAGNOSIS — I5022 Chronic systolic (congestive) heart failure: Secondary | ICD-10-CM

## 2019-01-11 DIAGNOSIS — F17200 Nicotine dependence, unspecified, uncomplicated: Secondary | ICD-10-CM

## 2019-01-11 DIAGNOSIS — F17210 Nicotine dependence, cigarettes, uncomplicated: Secondary | ICD-10-CM | POA: Diagnosis not present

## 2019-01-11 DIAGNOSIS — E782 Mixed hyperlipidemia: Secondary | ICD-10-CM

## 2019-01-11 DIAGNOSIS — I2510 Atherosclerotic heart disease of native coronary artery without angina pectoris: Secondary | ICD-10-CM

## 2019-01-11 DIAGNOSIS — Z716 Tobacco abuse counseling: Secondary | ICD-10-CM

## 2019-01-11 DIAGNOSIS — Z7189 Other specified counseling: Secondary | ICD-10-CM

## 2019-01-11 MED ORDER — ASPIRIN 81 MG PO TBEC: 81.0000 mg | DELAYED_RELEASE_TABLET | Freq: Every day | ORAL | Status: DC

## 2019-01-11 NOTE — Patient Instructions (Signed)

## 2019-01-11 NOTE — Progress Notes (Signed)
Virtual Visit via Video Note   This visit type was conducted due to national recommendations for restrictions regarding the COVID-19 Pandemic (e.g. social distancing) in an effort to limit this patient's exposure and mitigate transmission in our community.  Due to her co-morbid illnesses, this patient is at least at moderate risk for complications without adequate follow up.  This format is felt to be most appropriate for this patient at this time.  All issues noted in this document were discussed and addressed.  A limited physical exam was performed with this format.  Please refer to the patient's chart for her consent to telehealth for [facility].   Date:  01/11/2019   ID:  Lindsey Payne, DOB 1950-09-07, MRN [id number]  Patient Location: Home Provider Location: Home  PCP:  Matilde Haymaker, MD  Cardiologist:  Buford Dresser, MD  Electrophysiologist:  None   Evaluation Performed:  Follow-Up Visit  Chief Complaint:  Follow up  History of Present Illness:    Lindsey Payne is a 68 y.o. female with PMH CAD, mixed hyperlipidemia, hypertension, tobacco abuse. She is a new patient to me, initially followed with Dr. Wynonia Lawman and then Dr. Geraldo Pitter.  The patient does not have symptoms concerning for COVID-19 infection (fever, chills, cough, or new shortness of breath).   We had two attempts at video conferencing and were unable to get it to work, so it was transitioned to a phone visit.  HPI: [name] is a very pleasant former patient of Dr. Thurman Coyer. Doing very well overall. No complaints or concerns. Denies chest pain, shortness of breath at rest or with normal exertion. No PND, orthopnea, LE edema or unexpected weight gain. No syncope or palpitations.  Reviewed her records and medical history with her. She reports 2 stents placed with Dr. Wynonia Lawman in 2007, though I cannot see this in his notes (he notes nonobstructive CAD). No cath since 2007.  Her BP is  always in the 130-135/60 range, no high pressures.   Still smoking, chewing nicotine gum but doesn't seem to help. Tried chantix a few years ago, didn't work. Down to 3 cigarettes/day. Discussed nicotrol, will look into it with her insurance (listed as several hundred dollars online).   Staying socially distanced. Watching diet and staying active as best she can, but has gained a few pounds with staying at home. Reviewed secondary prevention lifestyle recommendations.  Past Medical History:  Diagnosis Date  . Adenoma of large intestine   . Anxiety   . Arthritis    right hand   . Asthma   . Back pain   . CHF (congestive heart failure) ([place])   . Coronary artery disease   . Depression   . Ganglion cyst    rt wrist  . Hemorrhoids   . Hyperlipidemia   . Hypertension   . Loss of teeth due to extraction 09/29/2016  . Lung collapse 1984   left lung, no problems since then  . Transfusion history   . Tubular adenoma   . Wears glasses    Past Surgical History:  Procedure Laterality Date  . ABDOMINAL HYSTERECTOMY     has one ovary left  . BACK SURGERY     3 disc removed  . CESAREAN SECTION     1983  . COLON SURGERY  1987   benign tubulovillous adenoma  . COLONOSCOPY N/A 07/22/2017   Procedure: COLONOSCOPY;  Surgeon: Alphonsa Overall, MD;  Location: [facility];  Service: General;  Laterality: N/A;  NO ANESTHESIA  PER DR. Lucia Gaskins  . CORONARY ANGIOPLASTY  2005   w 2 stents  . GANGLION CYST EXCISION Right 10/07/2016   Procedure: Right wrist volar ganglion excision;  Surgeon: Iran Planas, MD;  Location: [facility];  Service: Orthopedics;  Laterality: Right;  . Heart stints     Dr. Wynonia Lawman  . LUNG SURGERY Left    for collasped lung  . TONSILLECTOMY    . TOTAL HIP ARTHROPLASTY Right 05/13/2015   Procedure: RIGHT TOTAL HIP ARTHROPLASTY ANTERIOR APPROACH;  Surgeon: Mcarthur Rossetti, MD;  Location: [place];  Service: Orthopedics;  Laterality: Right;     Current Meds   Medication Sig  . aspirin 81 MG EC tablet Take 1 tablet (81 mg total) by mouth daily.  . hydrALAZINE (APRESOLINE) 50 MG tablet Take 1 tablet (50 mg total) by mouth 2 (two) times daily.  . isosorbide dinitrate (ISORDIL) 20 MG tablet TAKE 1 TABLET BY MOUTH TWICE A DAY  . lisinopril (ZESTRIL) 20 MG tablet Take 1 tablet (20 mg total) by mouth daily.  . metoprolol succinate (TOPROL-XL) 25 MG 24 hr tablet TAKE 1 TABLET BY MOUTH TWO  TIMES DAILY  . nitroGLYCERIN (NITROSTAT) 0.4 MG SL tablet Place 1 tablet (0.4 mg total) under the tongue every 5 (five) minutes as needed.  Marland Kitchen oxymetazoline (AFRIN) 0.05 % nasal spray Place 1 spray into both nostrils 2 (two) times daily as needed for congestion.  Marland Kitchen PARoxetine (PAXIL) 40 MG tablet Take 1 tablet (40 mg total) by mouth every morning.  . potassium chloride SA (KLOR-CON M20) 20 MEQ tablet Take 1 tablet (20 mEq total) by mouth 2 (two) times daily.  . rosuvastatin (CRESTOR) 20 MG tablet Take 1 tablet (20 mg total) by mouth daily.  Marland Kitchen spironolactone (ALDACTONE) 25 MG tablet Take 1 tablet (25 mg total) by mouth daily.  . [DISCONTINUED] aspirin 81 MG EC tablet Take 4 tablets (325 mg total) by mouth daily. (Patient taking differently: Take 81 mg by mouth daily. )     Allergies:   Valium [diazepam]   Social History   Tobacco Use  . Smoking status: Current Every Day Smoker    Packs/day: 0.25    Years: 50.00    Pack years: 12.50    Types: Cigarettes    Start date: 07/12/1958  . Smokeless tobacco: Never Used  . Tobacco comment: smoking 0-3 cigs per day  Substance Use Topics  . Alcohol use: Yes    Alcohol/week: 1.0 standard drinks    Types: 1 Cans of beer per week  . Drug use: No     Family Hx: The patient's family history includes Breast cancer in her mother; Cancer (age of onset: 21) in her sister; Colon cancer (age of onset: 43) in her father; Diabetes in her father; Heart disease in her father, maternal grandfather, and mother; Hypertension in her son;  Stroke in her maternal grandmother.  ROS:   Please see the history of present illness.    Constitutional: Negative for chills, fever, night sweats, unintentional weight loss  HENT: Negative for ear pain and hearing loss.   Eyes: Negative for loss of vision and eye pain.  Respiratory: Negative for cough, sputum, wheezing.   Cardiovascular: See HPI. Gastrointestinal: Negative for abdominal pain, melena, and hematochezia.  Genitourinary: Negative for dysuria and hematuria.  Musculoskeletal: Negative for falls and myalgias.  Skin: Negative for itching and rash.  Neurological: Negative for focal weakness, focal sensory changes and loss of consciousness.  Endo/Heme/Allergies: Does not bruise/bleed  easily.  All other systems reviewed and are negative.   Prior CV studies:   The following studies were reviewed today: Echo 2005 - doppler evidence of diastolic dysfunction The left ventricle was     mildly dilated. Overall left ventricular systolic function     was mildly to moderately decreased. Left ventricular ejection     fraction was estimated , range being 35 % to 45 %.. There     were no left ventricular regional wall motion abnormalities.     There was moderate diffuse left ventricular hypokinesis. Left     ventricular wall thickness was moderately increased.  - There was mild to moderate mitral valvular regurgitation.  - The left atrium was moderately dilated.   Per Dr. Thurman Coyer notes: Cath 2007: LM normal, 30% ostial LAD, 40% ostial RCA Echo 2011: EF 45-50%  Labs/Other Tests and Data Reviewed:    EKG:  No ECG available in the system  Recent Labs: 06/13/2018: ALT 27; Hemoglobin 12.7; Platelets 251; TSH 1.510 07/20/2018: BUN 7; Creatinine, Ser 1.11; Potassium 4.7; Sodium 141   Recent Lipid Panel Lab Results  Component Value Date/Time   CHOL 155 06/13/2018 10:54 AM   TRIG 174 (H) 06/13/2018 10:54 AM   HDL 52 06/13/2018 10:54 AM   CHOLHDL 3.0  06/13/2018 10:54 AM   CHOLHDL 7.0 (H) 07/08/2016 08:58 AM   LDLCALC 68 06/13/2018 10:54 AM   LDLDIRECT 74 02/21/2017 09:15 AM   LDLDIRECT 135 (H) 05/01/2012 09:18 AM    Wt Readings from Last 3 Encounters:  01/11/19 209 lb (94.8 kg)  01/09/19 207 lb (93.9 kg)  09/22/18 211 lb (95.7 kg)     Objective:    Vital Signs:  BP 135/60   Wt 209 lb (94.8 kg)   BMI 31.87 kg/m    Vitals reviewed Speaking easily on the phone No distress Alert and oriented Normal affect  ASSESSMENT & PLAN:    CAD: she reports prior stents, though in Dr. Thurman Coyer notes it states nonobstructive CAD. Asymptomatic. -continue aspirin 81 mg daily -continue rosuvastatin 20 mg daily -has SL NG, not requiring  Chronic systolic heart failure: asymptomatic -continue furosemide 20 mg BID -continue hydralazine-isordil 50-20 BID -continue lisinopril 20 mg daily -continue metoprolol succinate 25 mg BID (can consolidate if needed) -continue spironolactone 25 mg daily -last echo 2011 per notes -no symptoms, no indication to repeat at this time. -continue to review heart failure education  Hypertension: reports fair control at home, goal <130/80.  -continue meds as above  Hyperlipidemia: lipids 06/2018 with Tchol 155, TG 174 (unknown if fasting), HDL 52, LDL 68  Tobacco abuse with cessation counseling: The patient was counseled on tobacco cessation today for 9 minutes.  Counseling included reviewing the risks of smoking tobacco products, how it impacts the patient's current medical diagnoses and different strategies for quitting.  Pharmacotherapy to aid in tobacco cessation was not prescribed today. We discusssed the nicotrol inhaler as a good option for her given her low use and habit. It does not look as though this is reimbursed through her insurance, but she will reach out to them. I would be happy to write a prescription if needed.  Secondary prevention: -recommend heart healthy/Mediterranean diet, with whole  grains, fruits, vegetable, fish, lean meats, nuts, and olive oil. Limit salt. -recommend moderate walking, 3-5 times/week for 30-50 minutes each session. Aim for at least 150 minutes.week. Goal should be pace of 3 miles/hours, or walking 1.5 miles in 30 minutes -recommend avoidance of tobacco products.  Avoid excess alcohol. -Additional risk factor control:  -Diabetes: A1c is 5.6, denies history  -Lipids: as above  -Blood pressure control: as above  -Weight: BMI 31, has gained with COVID  COVID-19 Education: The signs and symptoms of COVID-19 were discussed with the patient and how to seek care for testing (follow up with PCP or arrange E-visit).  The importance of social distancing was discussed today.  Time:   Today, I have spent 16 minutes with the patient with telehealth technology discussing the above problems.    Patient Instructions  Medication Instructions:  Your Physician recommend you continue on your current medication as directed.    If you need a refill on your cardiac medications before your next appointment, please call your pharmacy.   Lab work: None  Testing/Procedures: None  Follow-Up: At [facility], you and your health needs are our priority.  As part of our continuing mission to provide you with exceptional heart care, we have created designated Provider Care Teams.  These Care Teams include your primary Cardiologist (physician) and Advanced Practice Providers (APPs -  Physician Assistants and Nurse Practitioners) who all work together to provide you with the care you need, when you need it. You will need a follow up appointment in 6 months.  Please call our office 2 months in advance to schedule this appointment.  You may see Buford Dresser, MD or one of the following Advanced Practice Providers on your designated Care Team:   Rosaria Ferries, PA-C . Jory Sims, DNP, ANP      Medication Adjustments/Labs and Tests Ordered: Current medicines are  reviewed at length with the patient today.  Concerns regarding medicines are outlined above.   Tests Ordered: No orders of the defined types were placed in this encounter.   Medication Changes: Meds ordered this encounter  Medications  . aspirin 81 MG EC tablet    Sig: Take 1 tablet (81 mg total) by mouth daily.    Follow Up:  6 mos  Signed, Buford Dresser, MD  01/11/2019 8:42 PM    [facility]

## 2019-03-16 ENCOUNTER — Other Ambulatory Visit: Payer: Self-pay

## 2019-03-16 ENCOUNTER — Encounter: Payer: Self-pay | Admitting: Family Medicine

## 2019-03-16 ENCOUNTER — Ambulatory Visit (INDEPENDENT_AMBULATORY_CARE_PROVIDER_SITE_OTHER): Payer: Medicare Other | Admitting: Family Medicine

## 2019-03-16 DIAGNOSIS — I951 Orthostatic hypotension: Secondary | ICD-10-CM | POA: Diagnosis not present

## 2019-03-16 HISTORY — DX: Orthostatic hypotension: I95.1

## 2019-03-16 MED ORDER — HYDRALAZINE HCL 50 MG PO TABS: 25.0000 mg | ORAL_TABLET | Freq: Two times a day (BID) | ORAL | 3 refills | Status: DC

## 2019-03-16 NOTE — Progress Notes (Signed)
    Subjective:  Lindsey Payne is a 68 y.o. female who presents to the [facility] today with a chief complaint of dizziness/lightheadedness.   HPI:  Orthostatic hypotension Lindsey Payne reports that for roughly the past 3 weeks, she has been having lightheadedness with standing.  She notes that this occurs exclusively with moving from a seated to a standing position.  This gives her the sensation of feeling lightheaded or needing to catch her balance.  She does not feel that the room is spinning.  Does not appear to be associated with chest pain or exertion.  There is not appear to be any specific stimulus associated with this.  She does not notice palpitations during these episodes.  She has strict liquid intake of 5 L of liquid per day.  Chief Complaint noted Review of Symptoms - see HPI PMH - current smoker  Objective:  Physical Exam: BP 124/78   Pulse 71   Ht 5\' 7"  (1.702 m)   Wt 211 lb (95.7 kg)   SpO2 98%   BMI 33.05 kg/m    Gen: NAD, resting comfortably HEENT: No appreciable JVD.  Moist mucous membranes CV: RRR with no murmurs appreciated Pulm: NWOB, CTAB with no crackles, wheezes, or rhonchi Abdomen: Soft, nontender Extremities: Warm, dry, strong radial pulse.  No lower extremity edema.  Orthostatic VS for the past 24 hrs:  BP- Lying Pulse- Lying BP- Sitting Pulse- Sitting BP- Standing at 0 minutes Pulse- Standing at 0 minutes  03/16/19 1508 130/78 66 110/68 70 110/64 72    No results found for this or any previous visit (from the past 72 hour(s)).   Assessment/Plan:  Orthostatic hypotension Orthostatic vitals in clinic showed drop of 20 mmHg systolic and 14 mmHg diastolic when moving from seated to standing position.  She appears euvolemic on exam.  Likely related to her current medication. -Decrease hydralazine from 50 mg twice daily to 25 mg twice daily -Follow-up with cardiology for appropriate medication management

## 2019-03-16 NOTE — Assessment & Plan Note (Signed)
Orthostatic vitals in clinic showed drop of 20 mmHg systolic and 14 mmHg diastolic when moving from seated to standing position.  She appears euvolemic on exam.  Likely related to her current medication. -Decrease hydralazine from 50 mg twice daily to 25 mg twice daily -Follow-up with cardiology for appropriate medication management

## 2019-03-16 NOTE — Patient Instructions (Addendum)
It sounds like you are likely experiencing something called orthostatic hypotension.  This has to do with experiencing low blood pressure while standing up.  I would like to adjust you medications today to make you feel more comfortable and I would like you to see your cardiologist within the next month because you cardiologist may prefer to change your medication regimen.    Starting today, let's halve your hydralazine.  I want you to take 25 mg twice daily instead of 50 mg.  If your cardiologist gives you different instructions, follow those new instructions.

## 2019-04-01 DIAGNOSIS — H2513 Age-related nuclear cataract, bilateral: Secondary | ICD-10-CM | POA: Diagnosis not present

## 2019-05-02 ENCOUNTER — Other Ambulatory Visit: Payer: Self-pay | Admitting: *Deleted

## 2019-05-02 MED ORDER — ROSUVASTATIN CALCIUM 20 MG PO TABS: 20.0000 mg | ORAL_TABLET | Freq: Every day | ORAL | 3 refills | Status: DC

## 2019-05-02 MED ORDER — PAROXETINE HCL 40 MG PO TABS: 40.0000 mg | ORAL_TABLET | Freq: Every morning | ORAL | 3 refills | Status: DC

## 2019-05-02 MED ORDER — SPIRONOLACTONE 25 MG PO TABS: 25.0000 mg | ORAL_TABLET | Freq: Every day | ORAL | 3 refills | Status: DC

## 2019-05-14 ENCOUNTER — Other Ambulatory Visit: Payer: Self-pay | Admitting: *Deleted

## 2019-05-15 MED ORDER — METOPROLOL SUCCINATE ER 25 MG PO TB24: 25.0000 mg | ORAL_TABLET | Freq: Two times a day (BID) | ORAL | 2 refills | Status: DC

## 2019-05-15 NOTE — Telephone Encounter (Signed)
2nd request from [facility] on the behalf of the patient on Toprol XL 25MG  tab. Salvatore Marvel, CMA

## 2019-05-15 NOTE — Addendum Note (Signed)
Addended by: Salvatore Marvel on: 05/15/2019 11:06 AM   Modules accepted: Orders

## 2019-05-28 ENCOUNTER — Other Ambulatory Visit: Payer: Self-pay | Admitting: *Deleted

## 2019-05-28 MED ORDER — POTASSIUM CHLORIDE CRYS ER 20 MEQ PO TBCR: 20.0000 meq | EXTENDED_RELEASE_TABLET | Freq: Two times a day (BID) | ORAL | 3 refills | Status: DC

## 2019-06-19 ENCOUNTER — Other Ambulatory Visit: Payer: Self-pay | Admitting: Family Medicine

## 2019-06-19 DIAGNOSIS — Z1231 Encounter for screening mammogram for malignant neoplasm of breast: Secondary | ICD-10-CM

## 2019-07-03 ENCOUNTER — Other Ambulatory Visit: Payer: Self-pay

## 2019-07-03 ENCOUNTER — Ambulatory Visit (INDEPENDENT_AMBULATORY_CARE_PROVIDER_SITE_OTHER): Payer: Medicare Other | Admitting: Cardiology

## 2019-07-03 ENCOUNTER — Encounter: Payer: Self-pay | Admitting: Cardiology

## 2019-07-03 VITALS — BP 117/74 | HR 80 | Temp 96.4°F | Ht 69.0 in | Wt 211.0 lb

## 2019-07-03 DIAGNOSIS — Z716 Tobacco abuse counseling: Secondary | ICD-10-CM

## 2019-07-03 DIAGNOSIS — Z7189 Other specified counseling: Secondary | ICD-10-CM | POA: Diagnosis not present

## 2019-07-03 DIAGNOSIS — I2510 Atherosclerotic heart disease of native coronary artery without angina pectoris: Secondary | ICD-10-CM

## 2019-07-03 DIAGNOSIS — F17210 Nicotine dependence, cigarettes, uncomplicated: Secondary | ICD-10-CM

## 2019-07-03 DIAGNOSIS — I5022 Chronic systolic (congestive) heart failure: Secondary | ICD-10-CM | POA: Diagnosis not present

## 2019-07-03 DIAGNOSIS — I10 Essential (primary) hypertension: Secondary | ICD-10-CM

## 2019-07-03 DIAGNOSIS — F17200 Nicotine dependence, unspecified, uncomplicated: Secondary | ICD-10-CM

## 2019-07-03 NOTE — Patient Instructions (Signed)

## 2019-07-03 NOTE — Progress Notes (Signed)
Cardiology Office Note:    Date:  07/03/2019   ID:  Lindsey Payne, DOB Nov 23, 1950, MRN [id number]  PCP:  Matilde Haymaker, MD  Cardiologist:  Buford Dresser, MD  Referring MD: Matilde Haymaker, MD   CC: follow up  History of Present Illness:    Lindsey Payne is a 68 y.o. female with a hx of CAD, chronic systolic heart failure, mixed hyperlipidemia, hypertension, tobacco abuse who is seen for follow up today. I initially met her via telemedicine 01/11/19 (prior Dr. Jennye Moccasin. Wynonia Lawman).  Cardiac history: She reports 2 stents placed with Dr. Wynonia Lawman in 2007, though I cannot see this in his notes (he notes nonobstructive CAD). No cath since 2007.Prior EF noted at 35-45% per notes, though Dr. Thurman Coyer note from 2015 noted EF 45-50% on echo 07/16/2009. Cath 08/2005: normal LM, ostial LAD 30%, ostial RCA 40%.  Today: Hypertension: well controlled today, reports good control at home. Tolerating medications without issue Hyperlipidemia: plans to follow up with Dr. Pilar Plate, will get all labs with him per her preference. Last lipids were excellent. Tobacco use: still smoking. Has tried gum, tabs, medications. Cut back to 3 cigarettes/day.   Overall stable. Has had a difficult year. Brother died from Covid this year, another brother had Covid and has had a long recovery. She has remained vigilant to be safe. She and her son are very close (her husband died when her son was 77). She speaks with him daily.  Reviewed medications, symptoms today. Denies chest pain, shortness of breath at rest or with normal exertion. No PND, orthopnea, LE edema or unexpected weight gain. No syncope or palpitations.  Past Medical History:  Diagnosis Date  . Adenoma of large intestine   . Anxiety   . Arthritis    right hand   . Asthma   . Back pain   . CHF (congestive heart failure) ([place])   . Coronary artery disease   . Depression   . Ganglion cyst    rt wrist  . Hemorrhoids   .  Hyperlipidemia   . Hypertension   . Loss of teeth due to extraction 09/29/2016  . Lung collapse 1984   left lung, no problems since then  . Transfusion history   . Tubular adenoma   . Wears glasses     Past Surgical History:  Procedure Laterality Date  . ABDOMINAL HYSTERECTOMY     has one ovary left  . BACK SURGERY     3 disc removed  . CESAREAN SECTION     1983  . COLON SURGERY  1987   benign tubulovillous adenoma  . COLONOSCOPY N/A 07/22/2017   Procedure: COLONOSCOPY;  Surgeon: Alphonsa Overall, MD;  Location: [facility];  Service: General;  Laterality: N/A;  NO ANESTHESIA PER DR. Lucia Gaskins  . CORONARY ANGIOPLASTY  2005   w 2 stents  . GANGLION CYST EXCISION Right 10/07/2016   Procedure: Right wrist volar ganglion excision;  Surgeon: Iran Planas, MD;  Location: [facility];  Service: Orthopedics;  Laterality: Right;  . Heart stints     Dr. Wynonia Lawman  . LUNG SURGERY Left    for collasped lung  . TONSILLECTOMY    . TOTAL HIP ARTHROPLASTY Right 05/13/2015   Procedure: RIGHT TOTAL HIP ARTHROPLASTY ANTERIOR APPROACH;  Surgeon: Mcarthur Rossetti, MD;  Location: [facility];  Service: Orthopedics;  Laterality: Right;    Current Medications: Current Outpatient Medications on File Prior to Visit  Medication Sig  . aspirin 81 MG EC tablet  Take 1 tablet (81 mg total) by mouth daily.  . furosemide (LASIX) 40 MG tablet TAKE ONE-HALF TABLET BY&nbsp;&nbsp;MOUTH TWO TIMES DAILY (Patient taking differently: 20 mg. TAKE ONE-HALF TABLET BY  MOUTH TWO TIMES DAILY)  . hydrALAZINE (APRESOLINE) 50 MG tablet Take 0.5 tablets (25 mg total) by mouth 2 (two) times daily.  . isosorbide dinitrate (ISORDIL) 20 MG tablet TAKE 1 TABLET BY MOUTH TWICE A DAY  . lisinopril (ZESTRIL) 20 MG tablet Take 1 tablet (20 mg total) by mouth daily.  . metoprolol succinate (TOPROL-XL) 25 MG 24 hr tablet Take 1 tablet (25 mg total) by mouth 2 (two) times daily.  . nitroGLYCERIN (NITROSTAT) 0.4 MG SL tablet Place  1 tablet (0.4 mg total) under the tongue every 5 (five) minutes as needed.  Marland Kitchen oxymetazoline (AFRIN) 0.05 % nasal spray Place 1 spray into both nostrils 2 (two) times daily as needed for congestion.  Marland Kitchen PARoxetine (PAXIL) 40 MG tablet Take 1 tablet (40 mg total) by mouth every morning.  . potassium chloride SA (KLOR-CON M20) 20 MEQ tablet Take 1 tablet (20 mEq total) by mouth 2 (two) times daily.  . rosuvastatin (CRESTOR) 20 MG tablet Take 1 tablet (20 mg total) by mouth daily.  Marland Kitchen spironolactone (ALDACTONE) 25 MG tablet Take 1 tablet (25 mg total) by mouth daily.   No current facility-administered medications on file prior to visit.     Allergies:   Valium [diazepam]   Social History   Tobacco Use  . Smoking status: Current Every Day Smoker    Packs/day: 0.25    Years: 50.00    Pack years: 12.50    Types: Cigarettes    Start date: 07/12/1958  . Smokeless tobacco: Never Used  . Tobacco comment: smoking 0-3 cigs per day  Substance Use Topics  . Alcohol use: Yes    Alcohol/week: 1.0 standard drinks    Types: 1 Cans of beer per week  . Drug use: No    Family History: family history includes Breast cancer in her mother; Cancer (age of onset: 74) in her sister; Colon cancer (age of onset: 19) in her father; Diabetes in her father; Heart disease in her father, maternal grandfather, and mother; Hypertension in her son; Stroke in her maternal grandmother.  ROS:   Please see the history of present illness.  Additional pertinent ROS: Constitutional: Negative for chills, fever, night sweats, unintentional weight loss  HENT: Negative for ear pain and hearing loss.   Eyes: Negative for loss of vision and eye pain.  Respiratory: Negative for cough, sputum, wheezing.   Cardiovascular: See HPI. Gastrointestinal: Negative for abdominal pain, melena, and hematochezia.  Genitourinary: Negative for dysuria and hematuria.  Musculoskeletal: Negative for falls and myalgias.  Skin: Negative for itching  and rash.  Neurological: Negative for focal weakness, focal sensory changes and loss of consciousness.  Endo/Heme/Allergies: Does not bruise/bleed easily.   EKGs/Labs/Other Studies Reviewed:    The following studies were reviewed today: No recent  EKG:  EKG is personally reviewed.  The ekg ordered today demonstrates NSR, abnormal R wave progression, small Q in III, aVF. No prior for comparison.  Recent Labs: 07/20/2018: BUN 7; Creatinine, Ser 1.11; Potassium 4.7; Sodium 141  Recent Lipid Panel    Component Value Date/Time   CHOL 155 06/13/2018 1054   TRIG 174 (H) 06/13/2018 1054   HDL 52 06/13/2018 1054   CHOLHDL 3.0 06/13/2018 1054   CHOLHDL 7.0 (H) 07/08/2016 0858   VLDL 55 (H) 07/08/2016 7741  LDLCALC 68 06/13/2018 1054   LDLDIRECT 74 02/21/2017 0915   LDLDIRECT 135 (H) 05/01/2012 0918    Physical Exam:    VS:  BP 117/74   Pulse 80   Temp (!) 96.4 F (35.8 C)   Ht _0  (1.753 m)   Wt 211 lb (95.7 kg)   SpO2 100%   BMI 31.16 kg/m     Wt Readings from Last 3 Encounters:  07/03/19 211 lb (95.7 kg)  03/16/19 211 lb (95.7 kg)  01/11/19 209 lb (94.8 kg)    GEN: Well nourished, well developed in no acute distress HEENT: Normal, moist mucous membranes NECK: No JVD CARDIAC: regular rhythm, normal S1 and S2, no rubs or gallops. No murmur. VASCULAR: Radial and DP pulses 2+ bilaterally. No carotid bruits RESPIRATORY:  Clear to auscultation without rales, wheezing or rhonchi  ABDOMEN: Soft, non-tender, non-distended MUSCULOSKELETAL:  Ambulates independently SKIN: Warm and dry, no edema NEUROLOGIC:  Alert and oriented x 3. No focal neuro deficits noted. PSYCHIATRIC:  Normal affect   ASSESSMENT:    1. Coronary artery disease involving native coronary artery of native heart without angina pectoris   2. Essential hypertension   3. Chronic systolic heart failure ([place])   4. Educated about COVID-19 virus infection   5. Tobacco use disorder   6. Tobacco abuse counseling     PLAN:    CAD: she reports prior stents, though in Dr. Thurman Coyer notes it states nonobstructive CAD. Asymptomatic. -continue aspirin 81 mg daily -continue rosuvastatin 20 mg daily -has SL NG, not requiring  Chronic systolic heart failure: asymptomatic -continue furosemide 20 mg BID -continue hydralazine-isordil 50-20 BID -continue lisinopril 20 mg daily -continue metoprolol succinate 25 mg BID (can consolidate if needed) -continue spironolactone 25 mg daily -last echo 2011 per notes -no symptoms, no indication to repeat at this time. -continue to review heart failure education  Hypertension: well controlled today, goal <130/80.  -continue meds as above  Hyperlipidemia: lipids 06/2018 with Tchol 155, TG 174 (unknown if fasting), HDL 52, LDL 68. Plans to repeat with Dr. Pilar Plate at next visit. Goal LDL <70.  Tobacco abuse with cessation counseling: The patient was counseled on tobacco cessation today for 3 minutes.  Counseling included reviewing the risks of smoking tobacco products, how it impacts the patient's current medical diagnoses and different strategies for quitting.  Pharmacotherapy to aid in tobacco cessation was not prescribed today.  Secondary prevention: -recommend heart healthy/Mediterranean diet, with whole grains, fruits, vegetable, fish, lean meats, nuts, and olive oil. Limit salt. -recommend moderate walking, 3-5 times/week for 30-50 minutes each session. Aim for at least 150 minutes.week. Goal should be pace of 3 miles/hours, or walking 1.5 miles in 30 minutes -recommend avoidance of tobacco products. Avoid excess alcohol.  Plan for follow up: 1 year or sooner PRN  Medication Adjustments/Labs and Tests Ordered: Current medicines are reviewed at length with the patient today.  Concerns regarding medicines are outlined above.  Orders Placed This Encounter  Procedures  . EKG 12-Lead   No orders of the defined types were placed in this encounter.   Patient  Instructions  Medication Instructions:  Your Physician recommend you continue on your current medication as directed.    *If you need a refill on your cardiac medications before your next appointment, please call your pharmacy*  Lab Work: None  Testing/Procedures: None  Follow-Up: At [facility], you and your health needs are our priority.  As part of our continuing mission to provide you with  exceptional heart care, we have created designated Provider Care Teams.  These Care Teams include your primary Cardiologist (physician) and Advanced Practice Providers (APPs -  Physician Assistants and Nurse Practitioners) who all work together to provide you with the care you need, when you need it.  Your next appointment:   1 year(s)  The format for your next appointment:   In Person  Provider:   Buford Dresser, MD     Signed, Buford Dresser, MD PhD 07/03/2019  [facility]

## 2019-07-05 ENCOUNTER — Encounter: Payer: Self-pay | Admitting: Cardiology

## 2019-08-08 ENCOUNTER — Other Ambulatory Visit: Payer: Self-pay

## 2019-08-08 ENCOUNTER — Ambulatory Visit
Admission: RE | Admit: 2019-08-08 | Discharge: 2019-08-08 | Disposition: A | Discharge status: Home / Self Care | Payer: Medicare Other | Source: Ambulatory Visit | Attending: Family Medicine | Admitting: Family Medicine

## 2019-08-08 DIAGNOSIS — Z1231 Encounter for screening mammogram for malignant neoplasm of breast: Secondary | ICD-10-CM | POA: Diagnosis not present

## 2019-08-14 NOTE — Progress Notes (Signed)
   CHIEF COMPLAINT / HPI:  HFrEF Recently seen by cardiology with titration of medication.  She feels well today.  She reports that she is not having any chest pain at this time is able to walk comfortably without chest pain or significant shortness of breath.  HTN Assessed recently by cardiology.  No lightheadedness or dizziness upon standing.  HLD Due for lipid panel.  Cardiology deferred to family medicine for lipid panel.  Current medications include: Rosuvastatin 20.  Nicotine use She reports that she has been a smoker for the past 60 years.  She is only smoked 2 to 3 cigarettes/day for most of the time and had a peak nicotine use of 1/3 pack/day.  She is not interested in quitting and has previously tried to quit with Chantix without success.  She currently smokes 3 cigarettes a day and chews Nicorette throughout the day.  Anxiety Currently takes Paxil 40 mg daily.  Reports that her anxiety symptoms are very well controlled today.  GAD-7 score of 0.  PERTINENT  PMH / PSH: Current smoker.   OBJECTIVE: BP (!) 142/86   Pulse 70   Ht 5\' 9"  (1.753 m)   Wt 96.6 kg   SpO2 97%   BMI 31.45 kg/m    General: Alert and cooperative and appears to be in no acute distress HEENT: Neck non-tender without lymphadenopathy, masses or thyromegaly Cardio: Normal S1 and S2, no S3 or S4. Rhythm is regular. No murmurs or rubs.   Pulm: Clear to auscultation bilaterally, no crackles, wheezing, or diminished breath sounds. Normal respiratory effort   ASSESSMENT / PLAN:  Hyperlipidemia -Follow-up lipid panel  Anxiety state Stable. -Continue Paxil 40 mg daily  Cigarette smoker Insufficient smoking history to require low-dose CT.  Interested in quitting.  Techniques and habits discussed today to help reduce her smoking.  Plan to follow-up in 1 month to discuss progress and other resources.  Heart failure with reduced ejection fraction [facility]) Recently assessed by cardiology.  No medication  changes at this time.  Asymptomatic.  No signs of fluid overload on exam. -Follow-up BMP  Essential hypertension Elevated blood pressure today.  Mildly reduced upon recheck to 142/86.  This seems inconsistent with her previously documented blood pressures should typically below 120/80.  She reports taking all of her medications this morning.  We will not make any medication changes today.  Plan to recheck blood pressure on return visit in 1 month.  Will consider medication changes at that time.     Matilde Haymaker, MD [place]

## 2019-08-15 ENCOUNTER — Encounter: Payer: Self-pay | Admitting: Family Medicine

## 2019-08-15 ENCOUNTER — Other Ambulatory Visit: Payer: Self-pay

## 2019-08-15 ENCOUNTER — Ambulatory Visit (INDEPENDENT_AMBULATORY_CARE_PROVIDER_SITE_OTHER): Payer: Medicare Other | Admitting: Family Medicine

## 2019-08-15 VITALS — BP 142/86 | HR 70 | Ht 69.0 in | Wt 213.0 lb

## 2019-08-15 DIAGNOSIS — E782 Mixed hyperlipidemia: Secondary | ICD-10-CM

## 2019-08-15 DIAGNOSIS — F17210 Nicotine dependence, cigarettes, uncomplicated: Secondary | ICD-10-CM

## 2019-08-15 DIAGNOSIS — I5020 Unspecified systolic (congestive) heart failure: Secondary | ICD-10-CM

## 2019-08-15 DIAGNOSIS — I10 Essential (primary) hypertension: Secondary | ICD-10-CM | POA: Diagnosis not present

## 2019-08-15 DIAGNOSIS — I2510 Atherosclerotic heart disease of native coronary artery without angina pectoris: Secondary | ICD-10-CM

## 2019-08-15 DIAGNOSIS — F411 Generalized anxiety disorder: Secondary | ICD-10-CM

## 2019-08-15 HISTORY — DX: Unspecified systolic (congestive) heart failure: I50.20

## 2019-08-15 NOTE — Assessment & Plan Note (Signed)
Insufficient smoking history to require low-dose CT.  Interested in quitting.  Techniques and habits discussed today to help reduce her smoking.  Plan to follow-up in 1 month to discuss progress and other resources.

## 2019-08-15 NOTE — Patient Instructions (Signed)
It was so nice to see you today!  It looks like you are pretty healthy!  Work on trying to find new habits to replace your smoking habits.  I don't think we need to start more medication today.  Please come back in one month and we'll look at your blood pressure and talk about how the smoking cessation is going.

## 2019-08-15 NOTE — Assessment & Plan Note (Signed)
Elevated blood pressure today.  Mildly reduced upon recheck to 142/86.  This seems inconsistent with her previously documented blood pressures should typically below 120/80.  She reports taking all of her medications this morning.  We will not make any medication changes today.  Plan to recheck blood pressure on return visit in 1 month.  Will consider medication changes at that time.

## 2019-08-15 NOTE — Assessment & Plan Note (Signed)
Follow-up lipid panel 

## 2019-08-15 NOTE — Assessment & Plan Note (Signed)
Stable. -Continue Paxil 40 mg daily

## 2019-08-15 NOTE — Assessment & Plan Note (Signed)
Recently assessed by cardiology.  No medication changes at this time.  Asymptomatic.  No signs of fluid overload on exam. -Follow-up BMP

## 2019-08-16 LAB — LIPID PANEL
Chol/HDL Ratio: 3 ratio (ref 0.0–4.4)
Cholesterol, Total: 131 mg/dL (ref 100–199)
HDL: 43 mg/dL (ref >39)
LDL Chol Calc (NIH): 64 mg/dL (ref 0–99)
Triglycerides: 137 mg/dL (ref 0–149)
VLDL Cholesterol Cal: 24 mg/dL (ref 5–40)

## 2019-08-16 LAB — BASIC METABOLIC PANEL
BUN/Creatinine Ratio: 9 — ABNORMAL LOW (ref 12–28)
BUN: 10 mg/dL (ref 8–27)
CO2: 24 mmol/L (ref 20–29)
Calcium: 9.6 mg/dL (ref 8.7–10.3)
Chloride: 104 mmol/L (ref 96–106)
Creatinine, Ser: 1.16 mg/dL — ABNORMAL HIGH (ref 0.57–1.00)
GFR calc Af Amer: 56 mL/min/{1.73_m2} — ABNORMAL LOW (ref >59)
GFR calc non Af Amer: 48 mL/min/{1.73_m2} — ABNORMAL LOW (ref >59)
Glucose: 95 mg/dL (ref 65–99)
Potassium: 4.1 mmol/L (ref 3.5–5.2)
Sodium: 139 mmol/L (ref 134–144)

## 2019-08-17 ENCOUNTER — Other Ambulatory Visit: Payer: Self-pay | Admitting: *Deleted

## 2019-08-17 MED ORDER — ISOSORBIDE DINITRATE 20 MG PO TABS: ORAL_TABLET | ORAL | 3 refills | Status: DC

## 2019-10-17 ENCOUNTER — Other Ambulatory Visit: Payer: Self-pay | Admitting: *Deleted

## 2019-10-17 MED ORDER — FUROSEMIDE 40 MG PO TABS: ORAL_TABLET | ORAL | 3 refills | Status: DC

## 2019-11-29 ENCOUNTER — Ambulatory Visit (INDEPENDENT_AMBULATORY_CARE_PROVIDER_SITE_OTHER): Payer: Medicare Other | Admitting: Family Medicine

## 2019-11-29 ENCOUNTER — Encounter: Payer: Self-pay | Admitting: Family Medicine

## 2019-11-29 ENCOUNTER — Other Ambulatory Visit: Payer: Self-pay

## 2019-11-29 DIAGNOSIS — I10 Essential (primary) hypertension: Secondary | ICD-10-CM

## 2019-11-29 DIAGNOSIS — F17210 Nicotine dependence, cigarettes, uncomplicated: Secondary | ICD-10-CM | POA: Diagnosis not present

## 2019-11-29 MED ORDER — NICOTINE 10 MG IN INHA: 1.0000 | RESPIRATORY_TRACT | 0 refills | Status: DC | PRN

## 2019-11-29 NOTE — Progress Notes (Signed)
    SUBJECTIVE:   CHIEF COMPLAINT / HPI:   Hypertension Her blood pressure is mildly elevated the last visit although it seemed that she had not taken her daily blood pressure medication.  She has taken all of her medication today as prescribed.  Her current medication that affects her blood pressure includes: -Hydralazine -Lisinopril -Spironolactone -Lasix -Isosorbide dinitrate She does not experience any significant lightheadedness or dizziness when moving from a seated to standing position.  Smoking cessation She continues to smoke 3 cigarettes a day and is very interested in smoking cessation.  Since our last visit, she has been using a straw to take slow calming breaths through with her morning coffee to provide a means of distraction for her hands and lips.  She finds this mildly helpful although she continues to require 3 cigarettes/day.  She is previously been prescribed Chantix without success.  She is not interested in using a nicotine patch.  She already uses Nicorette gum on occasion.  Health maintenance She is already is received her second Covid vaccine  PERTINENT  PMH / PSH: CHF, hypertension, CAD, heart failure, asthma  OBJECTIVE:   BP 130/80 Comment: PROVIDER INFORMED  Pulse 78   Ht 5\' 9"  (1.753 m)   Wt 213 lb 8 oz (96.8 kg)   SpO2 98%   BMI 31.53 kg/m    General: Alert and cooperative and appears to be in no acute distress Cardio: Normal S1 and S2, no S3 or S4. Rhythm is regular. No murmurs or rubs.   Pulm: Clear to auscultation bilaterally, no crackles, wheezing, or diminished breath sounds. Normal respiratory effort Extremities: No peripheral edema. Warm/ well perfused.  Strong radial pulse.  ASSESSMENT/PLAN:   Essential hypertension Blood pressure in the appropriate range today.  No evidence of hypotension. -Continue current blood pressure medication  Cigarette smoker Interested in quitting.  Not interested in trying Chantix again.  Not interested in  Wellbutrin.  Is interested in an alternative form of nicotine replacement. -Nicotrol Inhaler demonstrated today and prescribed through medication mail service -Quit line information provided -Follow-up in 1 month     Matilde Haymaker, MD [place]

## 2019-11-29 NOTE — Assessment & Plan Note (Signed)
Blood pressure in the appropriate range today.  No evidence of hypotension. -Continue current blood pressure medication

## 2019-11-29 NOTE — Assessment & Plan Note (Addendum)
Interested in quitting.  Not interested in trying Chantix again.  Not interested in Wellbutrin.  Is interested in an alternative form of nicotine replacement. -Nicotrol Inhaler demonstrated today and prescribed through medication mail service -Quit line information provided -Follow-up in 1 month

## 2019-11-29 NOTE — Patient Instructions (Addendum)
Smoking cessation: I think that you are doing a great job.  I know you are not interested in starting any new medications today.  We can try a different form of nicotine replacement.  I am going to give you something called Nicotrol which looks and feels more like a cigarette compared to other forms of nicotine replacement.  I recommend that you not use more than 1 capsule/day because you probably are not getting more nicotine than that from your 3 cigarettes/day.  Lets follow-up in 1 month and see how you are doing.

## 2019-11-30 ENCOUNTER — Telehealth: Payer: Self-pay | Admitting: *Deleted

## 2019-11-30 MED ORDER — NICOTINE 10 MG IN INHA: 1.0000 | Freq: Four times a day (QID) | RESPIRATORY_TRACT | 1 refills | Status: DC | PRN

## 2019-11-30 NOTE — Telephone Encounter (Signed)
Pharmacy is needing clarification on the frequency of administration for nicotrol (qd, bid, etc.).  Please include updated quantity for a 90 day supply.  Lindsey Payne,CMA

## 2019-11-30 NOTE — Telephone Encounter (Signed)
Nocotrol reordered Q6 hours PRN for 90 dose.

## 2019-12-13 ENCOUNTER — Other Ambulatory Visit: Payer: Self-pay | Admitting: Family Medicine

## 2019-12-24 ENCOUNTER — Other Ambulatory Visit: Payer: Self-pay | Admitting: *Deleted

## 2019-12-24 MED ORDER — LISINOPRIL 20 MG PO TABS: 20.0000 mg | ORAL_TABLET | Freq: Every day | ORAL | 5 refills | Status: DC

## 2020-02-25 ENCOUNTER — Other Ambulatory Visit: Payer: Self-pay | Admitting: Family Medicine

## 2020-03-07 ENCOUNTER — Other Ambulatory Visit: Payer: Self-pay | Admitting: Family Medicine

## 2020-03-11 ENCOUNTER — Other Ambulatory Visit: Payer: Self-pay | Admitting: Family Medicine

## 2020-04-01 ENCOUNTER — Other Ambulatory Visit: Payer: Self-pay | Admitting: Family Medicine

## 2020-04-05 DIAGNOSIS — H2513 Age-related nuclear cataract, bilateral: Secondary | ICD-10-CM | POA: Diagnosis not present

## 2020-04-07 ENCOUNTER — Other Ambulatory Visit: Payer: Self-pay

## 2020-04-07 ENCOUNTER — Ambulatory Visit (INDEPENDENT_AMBULATORY_CARE_PROVIDER_SITE_OTHER): Payer: Medicare Other | Admitting: Family Medicine

## 2020-04-07 DIAGNOSIS — F17210 Nicotine dependence, cigarettes, uncomplicated: Secondary | ICD-10-CM | POA: Diagnosis not present

## 2020-04-07 NOTE — Progress Notes (Signed)
    SUBJECTIVE:   CHIEF COMPLAINT / HPI:   Smoking cessation Lindsey Payne returns to clinic today to discuss her smoking cessation.  She was last seen in clinic on 5/20 at which time we started Nicotrol.  At her previous visit, she had been smoking about 3 cigarettes/day.  She reports that she has been smoking since she was about 68 years old and has been smoking at most 1/3 packs/day.  She reports significant improvement in her smoking habit since her last visit although she is not entirely weaned off of cigarettes.  She believes that she has smoked on 3 occasions in the past month.  For the most part she uses her Nicotrol without replacing the nicotine cartridge.  She thinks she is only used 2 or 3 nicotine cartridges in the past 4 months.  She runs that she is often drawn to go back to cigarette with her morning coffee.  PERTINENT  PMH / PSH: CAD, heart failure, hypertension  OBJECTIVE:   BP 132/80   Pulse 72   Ht 5\' 9"  (1.753 m)   Wt 209 lb 4 oz (94.9 kg)   SpO2 99%   BMI 30.90 kg/m    General: Resting comfortably.  Seated comfortably in her chair in the exam room.  No acute distress. Respiratory: Breathing comfortably on room air.  No acute distress.  No wheezing. Extremities: Warm, dry. Neuro: Cranial nerves grossly intact ASSESSMENT/PLAN:   Cigarette smoker She was congratulated on her remarkable improvement.  She seems to be down to about 1 cigarette/week.   It sounds like she has not been replacing the nicotine cartridges and that has been getting very little nicotine replacement overall. She was encouraged to continue using the Nicotrol but to replace the nicotine cartridge if she was experiencing a significant craving.  She was informed that she does not need to return to clinic for any additional visits about smoking cessation unless she wanted to. -Continue Nicotrol   Health maintenance Cancer screening up-to-date.  Based on her smoking history, she does not qualify for  annual lung cancer screening.  Matilde Haymaker, MD [place]

## 2020-04-07 NOTE — Patient Instructions (Signed)
Smoking cessation: It sounds like you have done a really amazing job cutting back on your smoking. I know you still smoke about 1 cigarette/week. I think that is very impressive and I think that you can continue to cut back on your own. I recommend you continue to use the Nicotrol like you have been using. When you start feeling a strong craving for cigarette, first replace the nicotine cartridge and your Nicotrol before reaching for that cigarette. I do not think you need to come back for another visit about smoking cessation unless you think it would be helpful.  Please come back to clinic in 6 months so we can keep an eye on your blood pressure and heart failure.

## 2020-04-07 NOTE — Assessment & Plan Note (Addendum)
She was congratulated on her remarkable improvement.  She seems to be down to about 1 cigarette/week.   It sounds like she has not been replacing the nicotine cartridges and that has been getting very little nicotine replacement overall. She was encouraged to continue using the Nicotrol but to replace the nicotine cartridge if she was experiencing a significant craving.  She was informed that she does not need to return to clinic for any additional visits about smoking cessation unless she wanted to. -Continue Nicotrol

## 2020-04-23 DIAGNOSIS — H353132 Nonexudative age-related macular degeneration, bilateral, intermediate dry stage: Secondary | ICD-10-CM | POA: Diagnosis not present

## 2020-04-23 DIAGNOSIS — H25812 Combined forms of age-related cataract, left eye: Secondary | ICD-10-CM | POA: Diagnosis not present

## 2020-04-25 DIAGNOSIS — Z01818 Encounter for other preprocedural examination: Secondary | ICD-10-CM | POA: Diagnosis not present

## 2020-04-25 DIAGNOSIS — H25811 Combined forms of age-related cataract, right eye: Secondary | ICD-10-CM | POA: Diagnosis not present

## 2020-04-28 DIAGNOSIS — H2513 Age-related nuclear cataract, bilateral: Secondary | ICD-10-CM | POA: Diagnosis not present

## 2020-04-28 DIAGNOSIS — H2511 Age-related nuclear cataract, right eye: Secondary | ICD-10-CM | POA: Diagnosis not present

## 2020-04-28 DIAGNOSIS — H25812 Combined forms of age-related cataract, left eye: Secondary | ICD-10-CM | POA: Diagnosis not present

## 2020-05-08 ENCOUNTER — Other Ambulatory Visit: Payer: Self-pay

## 2020-05-08 ENCOUNTER — Ambulatory Visit (INDEPENDENT_AMBULATORY_CARE_PROVIDER_SITE_OTHER): Payer: Medicare Other | Admitting: Podiatry

## 2020-05-08 DIAGNOSIS — M2042 Other hammer toe(s) (acquired), left foot: Secondary | ICD-10-CM | POA: Diagnosis not present

## 2020-05-08 DIAGNOSIS — M2041 Other hammer toe(s) (acquired), right foot: Secondary | ICD-10-CM | POA: Diagnosis not present

## 2020-05-08 DIAGNOSIS — L84 Corns and callosities: Secondary | ICD-10-CM | POA: Diagnosis not present

## 2020-05-08 NOTE — Patient Instructions (Signed)
More silicone pads can be purchased from:  [email or web address]  

## 2020-05-08 NOTE — Progress Notes (Signed)
  Subjective:  Patient ID: Lindsey Payne, female    DOB: February 19, 1951,  MRN: [id number]  Chief Complaint  Patient presents with  . routine foot care    nail trim     69 y.o. female presents with the above complaint. History confirmed with patient.  She has painful toenails that cause pressure and have a thick hardened skin on the border of the bilateral fourth and left third toes  Objective:  Physical Exam: warm, good capillary refill, no trophic changes or ulcerative lesions, normal DP and PT pulses and normal sensory exam. Left Foot: Hammertoe contractures present, the third and fourth are rotated in adductovarus position of have a dystrophic nail with a listers corn Right Foot: Fourth toe rotated adductovarus position with a dystrophic nail and listers corn   Assessment:   1. Hammertoe of right foot   2. Hammertoe of left foot   3. Callus of foot      Plan:  Patient was evaluated and treated and all questions answered.  Recommend they keep the nail trimmed short to keep pressure off this.  We also discussed your changes and offloading pad silicone offloading pads were dispensed today.  Discussed with him that the toes could possibly be straightened surgically but she would be at high risk for complications with this because she is a smoker.  We will try to treat nonsurgically currently.  Return in about 3 months (around 08/08/2020).

## 2020-06-25 ENCOUNTER — Other Ambulatory Visit: Payer: Self-pay | Admitting: Family Medicine

## 2020-07-10 ENCOUNTER — Other Ambulatory Visit: Payer: Self-pay | Admitting: Family Medicine

## 2020-07-10 DIAGNOSIS — Z1231 Encounter for screening mammogram for malignant neoplasm of breast: Secondary | ICD-10-CM

## 2020-07-16 ENCOUNTER — Ambulatory Visit: Payer: Medicare Other | Admitting: Cardiology

## 2020-08-14 ENCOUNTER — Other Ambulatory Visit: Payer: Self-pay

## 2020-08-14 ENCOUNTER — Ambulatory Visit (INDEPENDENT_AMBULATORY_CARE_PROVIDER_SITE_OTHER): Payer: Medicare Other | Admitting: Podiatry

## 2020-08-14 DIAGNOSIS — M2041 Other hammer toe(s) (acquired), right foot: Secondary | ICD-10-CM

## 2020-08-14 DIAGNOSIS — M2042 Other hammer toe(s) (acquired), left foot: Secondary | ICD-10-CM

## 2020-08-14 DIAGNOSIS — L84 Corns and callosities: Secondary | ICD-10-CM

## 2020-08-14 NOTE — Patient Instructions (Signed)
Look for urea 40% cream or ointment and apply to the thickened dry skin / calluses. This can be bought over the counter, at a pharmacy or online such as [employer].  

## 2020-08-15 ENCOUNTER — Encounter: Payer: Self-pay | Admitting: Podiatry

## 2020-08-15 NOTE — Progress Notes (Signed)
  Subjective:  Patient ID: Lindsey Payne, female    DOB: [birth year],  MRN: [id number]  Chief Complaint  Patient presents with  . routine foot care    Nail trim     70 y.o. female returns with the above complaint. History confirmed with patient.  Toenails are still painful when they are causing calluses.  She is not able to use the silicone pads did not help very much.  Objective:  Physical Exam: warm, good capillary refill, no trophic changes or ulcerative lesions, normal DP and PT pulses and normal sensory exam. Left Foot: Hammertoe contractures present, the third and fourth are rotated in adductovarus position of have a dystrophic nail with a listers corn Right Foot: Fourth toe rotated adductovarus position with a dystrophic nail and listers corn   Assessment:   1. Hammertoe of right foot   2. Hammertoe of left foot   3. Callus of foot      Plan:  Patient was evaluated and treated and all questions answered.  Considered other she seems to have used all of them and they did not help.  Recommended she use urea cream on these.  Discussed again hammertoe correction surgery and what it would entail.  I think she still would be high risk for this is a smoker.  If she is able to stop smoking and we can proceed with surgery.  Return in about 3 months (around 11/11/2020).

## 2020-08-18 ENCOUNTER — Other Ambulatory Visit: Payer: Self-pay

## 2020-08-18 ENCOUNTER — Ambulatory Visit
Admission: RE | Admit: 2020-08-18 | Discharge: 2020-08-18 | Disposition: A | Discharge status: Home / Self Care | Payer: Medicare Other | Source: Ambulatory Visit | Attending: Family Medicine | Admitting: Family Medicine

## 2020-08-18 DIAGNOSIS — Z1231 Encounter for screening mammogram for malignant neoplasm of breast: Secondary | ICD-10-CM

## 2020-08-19 ENCOUNTER — Encounter: Payer: Self-pay | Admitting: Gastroenterology

## 2020-08-20 ENCOUNTER — Other Ambulatory Visit: Payer: Self-pay

## 2020-08-20 ENCOUNTER — Encounter: Payer: Self-pay | Admitting: Cardiology

## 2020-08-20 ENCOUNTER — Ambulatory Visit (INDEPENDENT_AMBULATORY_CARE_PROVIDER_SITE_OTHER): Payer: Medicare Other | Admitting: Cardiology

## 2020-08-20 VITALS — BP 138/84 | HR 68 | Ht 69.0 in | Wt 209.2 lb

## 2020-08-20 DIAGNOSIS — R0600 Dyspnea, unspecified: Secondary | ICD-10-CM

## 2020-08-20 DIAGNOSIS — F17210 Nicotine dependence, cigarettes, uncomplicated: Secondary | ICD-10-CM

## 2020-08-20 DIAGNOSIS — E782 Mixed hyperlipidemia: Secondary | ICD-10-CM

## 2020-08-20 DIAGNOSIS — I10 Essential (primary) hypertension: Secondary | ICD-10-CM | POA: Diagnosis not present

## 2020-08-20 DIAGNOSIS — I5022 Chronic systolic (congestive) heart failure: Secondary | ICD-10-CM

## 2020-08-20 DIAGNOSIS — I2510 Atherosclerotic heart disease of native coronary artery without angina pectoris: Secondary | ICD-10-CM | POA: Diagnosis not present

## 2020-08-20 DIAGNOSIS — Z716 Tobacco abuse counseling: Secondary | ICD-10-CM

## 2020-08-20 DIAGNOSIS — R0609 Other forms of dyspnea: Secondary | ICD-10-CM

## 2020-08-20 NOTE — Progress Notes (Signed)
Cardiology Office Note:    Date:  08/20/2020   ID:  Lindsey Payne, DOB 03/25/1951, MRN [id number]  PCP:  Matilde Haymaker, MD  Cardiologist:  Buford Dresser, MD  Referring MD: Matilde Haymaker, MD   CC: follow up  History of Present Illness:    Lindsey Payne is a 70 y.o. female with a hx of CAD, chronic systolic heart failure, mixed hyperlipidemia, hypertension, tobacco abuse who is seen for follow up today. I initially met her via telemedicine 01/11/19 (prior Dr. Jennye Moccasin. Wynonia Lawman).  Cardiac history: She reports 2 stents placed with Dr. Wynonia Lawman in 2007, though I cannot see this in his notes (he notes nonobstructive CAD). No cath since 2007. Prior EF noted at 35-45% per notes, though Dr. Thurman Coyer note from 2015 noted EF 45-50% on echo 07/16/2009. Cath 08/2005: normal LM, ostial LAD 30%, ostial RCA 40%.  Today: Feels short of breath if she walks from her bedroom to her kitchen. Feels lightheaded with it as well. No chest discomfort with it. [place] about a month ago from dizzyness, no loss of consciousness. Has noticed more shortness of breath since then.   No swelling, breathing stable. No PND or orthopnea. Weight has been stable. Working on eating healthy food.   Working to quit smoking, still at 3 cigarettes/day. Habits are eating, drinking coffee--hard to break these routines. She wants to stop completely. Has tried gum, inhalers.   Not planning to do surgery for hammertoe, would like to avoid if possible.  No fevers/chills. Fully vaccinated, being safe.   Checks BP at home. Usually ~130/80s. No high or low numbers.  Past Medical History:  Diagnosis Date  . Adenoma of large intestine   . Anxiety   . Arthritis    right hand   . Asthma   . Back pain   . CHF (congestive heart failure) ([place])   . Coronary artery disease   . Depression   . Ganglion cyst    rt wrist  . Hemorrhoids   . Hyperlipidemia   . Hypertension   . Loss of teeth due to extraction  09/29/2016  . Lung collapse 1984   left lung, no problems since then  . Transfusion history   . Tubular adenoma   . Wears glasses     Past Surgical History:  Procedure Laterality Date  . ABDOMINAL HYSTERECTOMY     has one ovary left  . BACK SURGERY     3 disc removed  . CESAREAN SECTION     1983  . COLON SURGERY  1987   benign tubulovillous adenoma  . COLONOSCOPY N/A 07/22/2017   Procedure: COLONOSCOPY;  Surgeon: Alphonsa Overall, MD;  Location: [facility];  Service: General;  Laterality: N/A;  NO ANESTHESIA PER DR. Lucia Gaskins  . CORONARY ANGIOPLASTY  2005   w 2 stents  . GANGLION CYST EXCISION Right 10/07/2016   Procedure: Right wrist volar ganglion excision;  Surgeon: Iran Planas, MD;  Location: [facility];  Service: Orthopedics;  Laterality: Right;  . Heart stints     Dr. Wynonia Lawman  . LUNG SURGERY Left    for collasped lung  . TONSILLECTOMY    . TOTAL HIP ARTHROPLASTY Right 05/13/2015   Procedure: RIGHT TOTAL HIP ARTHROPLASTY ANTERIOR APPROACH;  Surgeon: Mcarthur Rossetti, MD;  Location: [facility];  Service: Orthopedics;  Laterality: Right;    Current Medications: Current Outpatient Medications on File Prior to Visit  Medication Sig  . aspirin 81 MG EC tablet Take 1 tablet (81  mg total) by mouth daily.  . furosemide (LASIX) 40 MG tablet TAKE ONE-HALF TABLET BY  MOUTH TWO TIMES DAILY  . hydrALAZINE (APRESOLINE) 50 MG tablet TAKE ONE-HALF TABLET BY  MOUTH TWICE DAILY  . isosorbide dinitrate (ISORDIL) 20 MG tablet TAKE 1 TABLET BY MOUTH  TWICE DAILY  . lisinopril (ZESTRIL) 20 MG tablet Take 1 tablet (20 mg total) by mouth daily.  . metoprolol succinate (TOPROL-XL) 25 MG 24 hr tablet TAKE 1 TABLET BY MOUTH  TWICE DAILY  . NICOTROL 10 MG inhaler USE 1 CARTRIDGE INTO THE  LUNGS EVERY 6 HOURS AS  NEEDED FOR SMOKING  CESSATION, FOR SMOKING  CRAVING  . oxymetazoline (AFRIN) 0.05 % nasal spray Place 1 spray into both nostrils 2 (two) times daily as needed for congestion.   Marland Kitchen PARoxetine (PAXIL) 40 MG tablet TAKE 1 TABLET BY MOUTH IN  THE MORNING  . potassium chloride SA (KLOR-CON) 20 MEQ tablet TAKE 1 TABLET BY MOUTH  TWICE DAILY  . prednisoLONE acetate (PRED FORTE) 1 % ophthalmic suspension Place into the left eye.  . rosuvastatin (CRESTOR) 20 MG tablet TAKE 1 TABLET BY MOUTH  DAILY  . spironolactone (ALDACTONE) 25 MG tablet TAKE 1 TABLET BY MOUTH  DAILY  . nitroGLYCERIN (NITROSTAT) 0.4 MG SL tablet Place 1 tablet (0.4 mg total) under the tongue every 5 (five) minutes as needed.   No current facility-administered medications on file prior to visit.     Allergies:   Valium [diazepam]   Social History   Tobacco Use  . Smoking status: Current Every Day Smoker    Packs/day: 0.25    Years: 50.00    Pack years: 12.50    Types: Cigarettes    Start date: 07/12/1958  . Smokeless tobacco: Never Used  . Tobacco comment: smoking 0-3 cigs per day  Vaping Use  . Vaping Use: Never used  Substance Use Topics  . Alcohol use: Yes    Alcohol/week: 1.0 standard drink    Types: 1 Cans of beer per week  . Drug use: No    Family History: family history includes Breast cancer in her mother; Cancer (age of onset: 59) in her sister; Colon cancer (age of onset: 52) in her father; Diabetes in her father; Heart disease in her father, maternal grandfather, and mother; Hypertension in her son; Stroke in her maternal grandmother.  ROS:   Please see the history of present illness.  Additional pertinent ROS otherwise unremarkable.  EKGs/Labs/Other Studies Reviewed:    The following studies were reviewed today: Prior summarized in HPI  EKG:  EKG is personally reviewed.  The ekg ordered today demonstrates NSR, abnormal R wave progression, small Q in III, aVF. This is similar to prior.  Recent Labs: No results found for requested labs within last 8760 hours.  Recent Lipid Panel    Component Value Date/Time   CHOL 131 08/15/2019 1141   TRIG 137 08/15/2019 1141   HDL 43  08/15/2019 1141   CHOLHDL 3.0 08/15/2019 1141   CHOLHDL 7.0 (H) 07/08/2016 0858   VLDL 55 (H) 07/08/2016 0858   LDLCALC 64 08/15/2019 1141   LDLDIRECT 74 02/21/2017 0915   LDLDIRECT 135 (H) 05/01/2012 0918    Physical Exam:    VS:  BP 138/84 (BP Location: Left Arm, Patient Position: Sitting)   Pulse 68   Ht '5\' 9"'  (1.753 m)   Wt 209 lb 3.2 oz (94.9 kg)   SpO2 99%   BMI 30.89 kg/m  Wt Readings from Last 3 Encounters:  08/20/20 209 lb 3.2 oz (94.9 kg)  04/07/20 209 lb 4 oz (94.9 kg)  11/29/19 213 lb 8 oz (96.8 kg)    GEN: Well nourished, well developed in no acute distress HEENT: Normal, moist mucous membranes NECK: No JVD CARDIAC: regular rhythm, normal S1 and S2, no rubs or gallops. No murmur. VASCULAR: Radial and DP pulses 2+ bilaterally. No carotid bruits RESPIRATORY:  Clear to auscultation without rales, wheezing or rhonchi  ABDOMEN: Soft, non-tender, non-distended MUSCULOSKELETAL:  Ambulates independently SKIN: Warm and dry, no edema NEUROLOGIC:  Alert and oriented x 3. No focal neuro deficits noted. PSYCHIATRIC:  Normal affect   ASSESSMENT:    1. Dyspnea on exertion   2. Chronic systolic heart failure ([place])   3. Coronary artery disease involving native coronary artery of native heart without angina pectoris   4. Essential hypertension   5. Tobacco abuse counseling   6. Mixed hyperlipidemia    PLAN:    Dyspnea on exertion -echo, nuclear stress test given history of cardiomyopathy and CAD  CAD: she reports prior stents, though in Dr. Thurman Coyer notes it states nonobstructive CAD. -continue aspirin 81 mg daily -continue rosuvastatin 20 mg daily -has SL NG, not requiring -getting nuclear stress for DOE as above  Chronic systolic heart failure:  -continue furosemide 20 mg BID -continue hydralazine-isordil 50-20 BID -continue lisinopril 20 mg daily -continue metoprolol succinate 25 mg BID (can consolidate if needed) -continue spironolactone 25 mg  daily -getting echo for DOE as above  Hypertension: goal <130/80, near goal here and well controlled at home -continue meds as above  Hyperlipidemia: Goal LDL <70. -last lipids per [facility] from 08/15/19: Tchol 131, HDL 43, LDL 64, TG 137 -continue rosuvastatin  Tobacco abuse with cessation counseling: The patient was counseled on tobacco cessation today for 5 minutes.  Counseling included reviewing the risks of smoking tobacco products, how it impacts the patient's current medical diagnoses and different strategies for quitting.  Pharmacotherapy to aid in tobacco cessation was not prescribed today.  Secondary prevention: -recommend heart healthy/Mediterranean diet, with whole grains, fruits, vegetable, fish, lean meats, nuts, and olive oil. Limit salt. -recommend moderate walking, 3-5 times/week for 30-50 minutes each session. Aim for at least 150 minutes.week. Goal should be pace of 3 miles/hours, or walking 1.5 miles in 30 minutes -recommend avoidance of tobacco products. Avoid excess alcohol.  Plan for follow up: 3 mos or sooner based on results of testing  Medication Adjustments/Labs and Tests Ordered: Current medicines are reviewed at length with the patient today.  Concerns regarding medicines are outlined above.  Orders Placed This Encounter  Procedures  . MYOCARDIAL PERFUSION IMAGING  . EKG 12-Lead  . ECHOCARDIOGRAM COMPLETE   No orders of the defined types were placed in this encounter.   Patient Instructions  Medication Instructions:  Your Physician recommend you continue on your current medication as directed.    *If you need a refill on your cardiac medications before your next appointment, please call your pharmacy*   Lab Work: None   Testing/Procedures: Your physician has requested that you have an echocardiogram. Echocardiography is a painless test that uses sound waves to create images of your heart. It provides your doctor with information about the size and  shape of your heart and how well your heart's chambers and valves are working. This procedure takes approximately one hour. There are no restrictions for this procedure. [place] has requested  that you have a lexiscan myoview. For further information please visit [email or web address]. Please follow instruction sheet, as given. [place] [address]   Follow-Up: At [facility], you and your health needs are our priority.  As part of our continuing mission to provide you with exceptional heart care, we have created designated Provider Care Teams.  These Care Teams include your primary Cardiologist (physician) and Advanced Practice Providers (APPs -  Physician Assistants and Nurse Practitioners) who all work together to provide you with the care you need, when you need it.  We recommend signing up for the patient portal called "MyChart".  Sign up information is provided on this After Visit Summary.  MyChart is used to connect with patients for Virtual Visits (Telemedicine).  Patients are able to view lab/test results, encounter notes, upcoming appointments, etc.  Non-urgent messages can be sent to your provider as well.   To learn more about what you can do with MyChart, go to [email or web address].    Your next appointment:   3 month(s)  The format for your next appointment:   In Person  Provider:   Buford Dresser, MD  You are scheduled for a Myocardial Perfusion Imaging Study on.  Please arrive 15 minutes prior to your appointment time for registration and insurance purposes.  The test will take approximately 3 to 4 hours to complete; you may bring reading material.  If someone comes with you to your appointment, they will need to remain in the main lobby due to limited space in the testing area. **If you are pregnant or breastfeeding, please notify the nuclear lab prior to your appointment**  How to prepare for your Myocardial  Perfusion Test: . Do not eat or drink 3 hours prior to your test, except you may have water. . Do not consume products containing caffeine (regular or decaffeinated) 12 hours prior to your test. (ex: coffee, chocolate, sodas, tea). . Do bring a list of your current medications with you.  If not listed below, you may take your medications as normal. . Do wear comfortable clothes (no dresses or overalls) and walking shoes, tennis shoes preferred (No heels or open toe shoes are allowed). . Do NOT wear cologne, perfume, aftershave, or lotions (deodorant is allowed). . If these instructions are not followed, your test will have to be rescheduled.  Please report to [address] for your test.  If you have questions or concerns about your appointment, you can call the Nuclear Lab at [phone].  If you cannot keep your appointment, please provide 24 hours notification to the Nuclear Lab, to avoid a possible $50 charge to your account.    Signed, Buford Dresser, MD PhD, [facility] 08/20/2020  [place]

## 2020-08-20 NOTE — Patient Instructions (Signed)
Medication Instructions:  Your Physician recommend you continue on your current medication as directed.    *If you need a refill on your cardiac medications before your next appointment, please call your pharmacy*   Lab Work: None   Testing/Procedures: Your physician has requested that you have an echocardiogram. Echocardiography is a painless test that uses sound waves to create images of your heart. It provides your doctor with information about the size and shape of your heart and how well your heart's chambers and valves are working. This procedure takes approximately one hour. There are no restrictions for this procedure. [place] has requested that you have a lexiscan myoview. For further information please visit [email or web address]. Please follow instruction sheet, as given. [place] [address]   Follow-Up: At [facility], you and your health needs are our priority.  As part of our continuing mission to provide you with exceptional heart care, we have created designated Provider Care Teams.  These Care Teams include your primary Cardiologist (physician) and Advanced Practice Providers (APPs -  Physician Assistants and Nurse Practitioners) who all work together to provide you with the care you need, when you need it.  We recommend signing up for the patient portal called "MyChart".  Sign up information is provided on this After Visit Summary.  MyChart is used to connect with patients for Virtual Visits (Telemedicine).  Patients are able to view lab/test results, encounter notes, upcoming appointments, etc.  Non-urgent messages can be sent to your provider as well.   To learn more about what you can do with MyChart, go to [email or web address].    Your next appointment:   3 month(s)  The format for your next appointment:   In Person  Provider:   Buford Dresser, MD  You are scheduled for a Myocardial Perfusion Imaging  Study on.  Please arrive 15 minutes prior to your appointment time for registration and insurance purposes.  The test will take approximately 3 to 4 hours to complete; you may bring reading material.  If someone comes with you to your appointment, they will need to remain in the main lobby due to limited space in the testing area. **If you are pregnant or breastfeeding, please notify the nuclear lab prior to your appointment**  How to prepare for your Myocardial Perfusion Test: . Do not eat or drink 3 hours prior to your test, except you may have water. . Do not consume products containing caffeine (regular or decaffeinated) 12 hours prior to your test. (ex: coffee, chocolate, sodas, tea). . Do bring a list of your current medications with you.  If not listed below, you may take your medications as normal. . Do wear comfortable clothes (no dresses or overalls) and walking shoes, tennis shoes preferred (No heels or open toe shoes are allowed). . Do NOT wear cologne, perfume, aftershave, or lotions (deodorant is allowed). . If these instructions are not followed, your test will have to be rescheduled.  Please report to [address] for your test.  If you have questions or concerns about your appointment, you can call the Nuclear Lab at [phone].  If you cannot keep your appointment, please provide 24 hours notification to the Nuclear Lab, to avoid a possible $50 charge to your account.

## 2020-09-10 ENCOUNTER — Telehealth (HOSPITAL_COMMUNITY): Payer: Self-pay | Admitting: *Deleted

## 2020-09-10 NOTE — Telephone Encounter (Signed)
Patient has a dpr but none the appropriate boxes were checked. Left message on voicemail in reference to upcoming appointment scheduled for 09/12/20. Phone number given for a call back so details instructions can be given.Alanda Colton, Ranae Palms

## 2020-09-11 ENCOUNTER — Telehealth (HOSPITAL_COMMUNITY): Payer: Self-pay | Admitting: *Deleted

## 2020-09-11 NOTE — Telephone Encounter (Signed)
Patient given detailed instructions per Myocardial Perfusion Study Information Sheet for the test on 09/12/20. Patient notified to arrive 15 minutes early and that it is imperative to arrive on time for appointment to keep from having the test rescheduled.  If you need to cancel or reschedule your appointment, please call the office within 24 hours of your appointment. . Patient verbalized understanding. Kirstie Peri

## 2020-09-11 NOTE — Telephone Encounter (Signed)
Left message on voicemail in reference to upcoming appointment scheduled for 09/12/20. Phone number given for a call back so details instructions can be given.  Lindsey Payne

## 2020-09-12 ENCOUNTER — Other Ambulatory Visit (HOSPITAL_COMMUNITY): Payer: Medicare Other

## 2020-09-12 ENCOUNTER — Other Ambulatory Visit: Payer: Self-pay

## 2020-09-12 ENCOUNTER — Ambulatory Visit (HOSPITAL_COMMUNITY): Payer: Medicare Other | Attending: Cardiovascular Disease

## 2020-09-12 DIAGNOSIS — R0600 Dyspnea, unspecified: Secondary | ICD-10-CM | POA: Insufficient documentation

## 2020-09-12 DIAGNOSIS — R0609 Other forms of dyspnea: Secondary | ICD-10-CM

## 2020-09-12 LAB — MYOCARDIAL PERFUSION IMAGING
LV dias vol: 97 mL (ref 46–106)
LV sys vol: 46 mL
Peak HR: 73 {beats}/min
Rest HR: 55 {beats}/min
SDS: 2
SRS: 2
SSS: 4
TID: 1.09

## 2020-09-12 MED ORDER — TECHNETIUM TC 99M TETROFOSMIN IV KIT
32.7000 | PACK | Freq: Once | INTRAVENOUS | Status: AC | PRN
  Administered 2020-09-12: 32.7 via INTRAVENOUS
  Filled 2020-09-12: qty 33

## 2020-09-12 MED ORDER — TECHNETIUM TC 99M TETROFOSMIN IV KIT
10.2000 | PACK | Freq: Once | INTRAVENOUS | Status: AC | PRN
  Administered 2020-09-12: 10.2 via INTRAVENOUS
  Filled 2020-09-12: qty 11

## 2020-09-12 MED ORDER — REGADENOSON 0.4 MG/5ML IV SOLN
0.4000 mg | Freq: Once | INTRAVENOUS | Status: AC
  Administered 2020-09-12: 0.4 mg via INTRAVENOUS

## 2020-09-18 ENCOUNTER — Other Ambulatory Visit (HOSPITAL_COMMUNITY): Payer: Medicare Other

## 2020-10-07 ENCOUNTER — Other Ambulatory Visit: Payer: Self-pay | Admitting: Family Medicine

## 2020-10-09 ENCOUNTER — Other Ambulatory Visit: Payer: Self-pay

## 2020-10-09 ENCOUNTER — Ambulatory Visit (HOSPITAL_COMMUNITY): Payer: Medicare Other | Attending: Cardiology

## 2020-10-09 DIAGNOSIS — R0600 Dyspnea, unspecified: Secondary | ICD-10-CM | POA: Insufficient documentation

## 2020-10-09 DIAGNOSIS — R0609 Other forms of dyspnea: Secondary | ICD-10-CM

## 2020-10-09 LAB — ECHOCARDIOGRAM COMPLETE
Area-P 1/2: 1.76 cm2
S' Lateral: 3.8 cm

## 2020-10-10 HISTORY — PX: POLYPECTOMY: SHX149

## 2020-10-10 HISTORY — PX: COLONOSCOPY: SHX174

## 2020-10-15 ENCOUNTER — Other Ambulatory Visit: Payer: Self-pay

## 2020-10-15 ENCOUNTER — Ambulatory Visit (AMBULATORY_SURGERY_CENTER): Payer: Self-pay | Admitting: *Deleted

## 2020-10-15 VITALS — Ht 69.0 in | Wt 207.0 lb

## 2020-10-15 DIAGNOSIS — Z86010 Personal history of colonic polyps: Secondary | ICD-10-CM

## 2020-10-15 MED ORDER — PEG-KCL-NACL-NASULF-NA ASC-C 100 G PO SOLR: 1.0000 | Freq: Once | ORAL | 0 refills | Status: AC

## 2020-10-15 NOTE — Progress Notes (Signed)
  No trouble with anesthesia, denies being told they were difficult to intubate, or hx/fam hx of malignant hyperthermia per pt   No egg or soy allergy  No home oxygen use   No medications for weight loss taken  2 day prep given per DO- Moviprep/Miralax  Pt denies constipation issues  Pt informed that we do not do prior authorizations for prep

## 2020-10-21 ENCOUNTER — Telehealth: Payer: Self-pay | Admitting: Podiatry

## 2020-10-21 NOTE — Telephone Encounter (Signed)
Called pt lvm to reschedule 5/5 appt

## 2020-10-28 ENCOUNTER — Encounter: Payer: Self-pay | Admitting: Podiatry

## 2020-10-29 ENCOUNTER — Encounter: Payer: Self-pay | Admitting: Gastroenterology

## 2020-10-29 ENCOUNTER — Ambulatory Visit (AMBULATORY_SURGERY_CENTER): Payer: Medicare Other | Admitting: Gastroenterology

## 2020-10-29 ENCOUNTER — Other Ambulatory Visit: Payer: Self-pay

## 2020-10-29 VITALS — BP 111/78 | HR 57 | Temp 97.1°F | Resp 18 | Ht 69.0 in | Wt 207.0 lb

## 2020-10-29 DIAGNOSIS — D124 Benign neoplasm of descending colon: Secondary | ICD-10-CM | POA: Diagnosis not present

## 2020-10-29 DIAGNOSIS — Z86010 Personal history of colonic polyps: Secondary | ICD-10-CM

## 2020-10-29 MED ORDER — SODIUM CHLORIDE 0.9 % IV SOLN
500.0000 mL | Freq: Once | INTRAVENOUS | Status: DC
Start: 2020-10-29 — End: 2020-10-29

## 2020-10-29 NOTE — Op Note (Addendum)
[place] Patient Name: Lindsey Payne Procedure Date: 10/29/2020 10:41 AM MRN: [id number] Endoscopist: Ladene Artist , MD Age: 70 Referring MD:  Date of Birth: [birth year] Gender: Female Account #: [id number] Procedure:                Colonoscopy Indications:              Surveillance: Personal history of adenomatous                            polyps on last colonoscopy 3 years ago Medicines:                Monitored Anesthesia Care Procedure:                Pre-Anesthesia Assessment:                           - Prior to the procedure, a History and Physical                            was performed, and patient medications and                            allergies were reviewed. The patient's tolerance of                            previous anesthesia was also reviewed. The risks                            and benefits of the procedure and the sedation                            options and risks were discussed with the patient.                            All questions were answered, and informed consent                            was obtained. Prior Anticoagulants: The patient has                            taken no previous anticoagulant or antiplatelet                            agents. ASA Grade Assessment: II - A patient with                            mild systemic disease. After reviewing the risks                            and benefits, the patient was deemed in                            satisfactory condition to undergo the procedure.  After obtaining informed consent, the colonoscope                            was passed under direct vision. Throughout the                            procedure, the patient's blood pressure, pulse, and                            oxygen saturations were monitored continuously. The                            Olympus CF-HQ190L (Serial# [id number]) Colonoscope was                            introduced through  the anus and advanced to the the                            ileocolonic anastomosis. The rectum was                            photographed. The quality of the bowel preparation                            was unsatisfactory. The colonoscopy was performed                            without difficulty. The patient tolerated the                            procedure well. Scope In: 10:50:04 AM Scope Out: 11:04:13 AM Scope Withdrawal Time: 0 hours 9 minutes 14 seconds  Total Procedure Duration: 0 hours 14 minutes 9 seconds  Findings:                 The perianal and digital rectal examinations showed                            external skin tage otherwise normal.                           There was evidence of a prior end-to-side                            ileo-colonic anastomosis in the ascending colon.                            This was patent and was characterized by healthy                            appearing mucosa. The anastomosis was not traversed.                           A 7 mm polyp was found in the descending colon. The  polyp was sessile. The polyp was removed with a                            cold snare. Resection and retrieval were complete.                           Multiple medium-mouthed diverticula were found in                            the entire colon. There was no evidence of                            diverticular bleeding.                           Internal hemorrhoids were found during                            retroflexion. The hemorrhoids were small and Grade                            I (internal hemorrhoids that do not prolapse).                           The exam was otherwise without abnormality on                            direct and retroflexion views. Complications:            No immediate complications. Estimated blood loss:                            None. Estimated Blood Loss:     Estimated blood loss: none. Impression:                - Preparation of the colon was unsatisfactory.                           - Patent end-to-side ileo-colonic anastomosis,                            characterized by healthy appearing mucosa.                           - One 7 mm polyp in the descending colon, removed                            with a cold snare. Resected and retrieved.                           - Moderate diverticulosis in the entire examined                            colon.                           -  Internal hemorrhoids.                           - The examination was otherwise normal on direct                            and retroflexion views. Recommendation:           - Repeat colonoscopy at next available appointment                            (within 3 months) because the bowel preparation was                            suboptimal with an extended bowel prep.                           - Patient has a contact number available for                            emergencies. The signs and symptoms of potential                            delayed complications were discussed with the                            patient. Return to normal activities tomorrow.                            Written discharge instructions were provided to the                            patient.                           - High fiber diet.                           - Continue present medications.                           - Await pathology results. Ladene Artist, MD 10/29/2020 11:15:02 AM This report has been signed electronically.

## 2020-10-29 NOTE — Patient Instructions (Signed)
Handouts given for polyps, diverticulosis, hemorrhoids and high fiber diet.  You have new appointments coming up: see other pages highlighted.  Await pathology results.  YOU HAD AN ENDOSCOPIC PROCEDURE TODAY AT [facility]:   Refer to the procedure report that was given to you for any specific questions about what was found during the examination.  If the procedure report does not answer your questions, please call your gastroenterologist to clarify.  If you requested that your care partner not be given the details of your procedure findings, then the procedure report has been included in a sealed envelope for you to review at your convenience later.  YOU SHOULD EXPECT: Some feelings of bloating in the abdomen. Passage of more gas than usual.  Walking can help get rid of the air that was put into your GI tract during the procedure and reduce the bloating. If you had a lower endoscopy (such as a colonoscopy or flexible sigmoidoscopy) you may notice spotting of blood in your stool or on the toilet paper. If you underwent a bowel prep for your procedure, you may not have a normal bowel movement for a few days.  Please Note:  You might notice some irritation and congestion in your nose or some drainage.  This is from the oxygen used during your procedure.  There is no need for concern and it should clear up in a day or so.  SYMPTOMS TO REPORT IMMEDIATELY:   Following lower endoscopy (colonoscopy or flexible sigmoidoscopy):  Excessive amounts of blood in the stool  Significant tenderness or worsening of abdominal pains  Swelling of the abdomen that is new, acute  Fever of 100F or higher  For urgent or emergent issues, a gastroenterologist can be reached at any hour by calling [phone]. Do not use MyChart messaging for urgent concerns.    DIET:  We do recommend a small meal at first, but then you may proceed to your regular diet.  Drink plenty of fluids but you should avoid  alcoholic beverages for 24 hours.  ACTIVITY:  You should plan to take it easy for the rest of today and you should NOT DRIVE or use heavy machinery until tomorrow (because of the sedation medicines used during the test).    FOLLOW UP: Our staff will call the number listed on your records 48-72 hours following your procedure to check on you and address any questions or concerns that you may have regarding the information given to you following your procedure. If we do not reach you, we will leave a message.  We will attempt to reach you two times.  During this call, we will ask if you have developed any symptoms of COVID 19. If you develop any symptoms (ie: fever, flu-like symptoms, shortness of breath, cough etc.) before then, please call [phone].  If you test positive for Covid 19 in the 2 weeks post procedure, please call and report this information to [place].    If any biopsies were taken you will be contacted by phone or by letter within the next 1-3 weeks.  Please call us at [phone] if you have not heard about the biopsies in 3 weeks.    SIGNATURES/CONFIDENTIALITY: You and/or your care partner have signed paperwork which will be entered into your electronic medical record.  These signatures attest to the fact that that the information above on your After Visit Summary has been reviewed and is understood.  Full responsibility of the confidentiality of this discharge  information lies with you and/or your care-partner. 

## 2020-10-29 NOTE — Progress Notes (Signed)
Pt. Reports she had her 2nd Covid booster, and shingles vaccinations yesterday.

## 2020-10-29 NOTE — Progress Notes (Signed)
pt tolerated well. VSS. awake and to recovery. Report given to RN.  

## 2020-10-29 NOTE — Progress Notes (Signed)
Called to room to assist during endoscopic procedure.  Patient ID and intended procedure confirmed with present staff. Received instructions for my participation in the procedure from the performing physician.  

## 2020-10-29 NOTE — Progress Notes (Signed)
Pt. Reports no change in her medical or surgical history since her pre-visit 10/15/2020.

## 2020-10-31 ENCOUNTER — Telehealth: Payer: Self-pay

## 2020-10-31 NOTE — Telephone Encounter (Signed)
  Follow up Call-  Call back number 10/29/2020  Post procedure Call Back phone  # [phone]  Permission to leave phone message Yes  Some recent data might be hidden     Patient questions:  Do you have a fever, pain , or abdominal swelling? No. Pain Score  0 *  Have you tolerated food without any problems? Yes.    Have you been able to return to your normal activities? Yes.    Do you have any questions about your discharge instructions: Diet   No. Medications  No. Follow up visit  No.  Do you have questions or concerns about your Care? No.  Actions: * If pain score is 4 or above: No action needed, pain <4.

## 2020-11-06 DIAGNOSIS — D126 Benign neoplasm of colon, unspecified: Secondary | ICD-10-CM | POA: Insufficient documentation

## 2020-11-06 HISTORY — DX: Benign neoplasm of colon, unspecified: D12.6

## 2020-11-10 ENCOUNTER — Encounter: Payer: Self-pay | Admitting: Family Medicine

## 2020-11-10 ENCOUNTER — Encounter: Payer: Self-pay | Admitting: Gastroenterology

## 2020-11-13 ENCOUNTER — Ambulatory Visit: Payer: Medicare Other | Admitting: Podiatry

## 2020-11-14 ENCOUNTER — Other Ambulatory Visit: Payer: Self-pay | Admitting: Family Medicine

## 2020-11-17 ENCOUNTER — Other Ambulatory Visit: Payer: Self-pay

## 2020-11-17 ENCOUNTER — Ambulatory Visit (INDEPENDENT_AMBULATORY_CARE_PROVIDER_SITE_OTHER): Payer: Medicare Other | Admitting: Cardiology

## 2020-11-17 ENCOUNTER — Encounter: Payer: Self-pay | Admitting: Cardiology

## 2020-11-17 VITALS — BP 130/80 | HR 70 | Ht 69.0 in | Wt 207.6 lb

## 2020-11-17 DIAGNOSIS — Z7189 Other specified counseling: Secondary | ICD-10-CM | POA: Diagnosis not present

## 2020-11-17 DIAGNOSIS — I10 Essential (primary) hypertension: Secondary | ICD-10-CM

## 2020-11-17 DIAGNOSIS — Z8679 Personal history of other diseases of the circulatory system: Secondary | ICD-10-CM

## 2020-11-17 DIAGNOSIS — Z716 Tobacco abuse counseling: Secondary | ICD-10-CM

## 2020-11-17 DIAGNOSIS — R0600 Dyspnea, unspecified: Secondary | ICD-10-CM

## 2020-11-17 DIAGNOSIS — I2510 Atherosclerotic heart disease of native coronary artery without angina pectoris: Secondary | ICD-10-CM | POA: Diagnosis not present

## 2020-11-17 DIAGNOSIS — F17200 Nicotine dependence, unspecified, uncomplicated: Secondary | ICD-10-CM | POA: Diagnosis not present

## 2020-11-17 DIAGNOSIS — E782 Mixed hyperlipidemia: Secondary | ICD-10-CM | POA: Diagnosis not present

## 2020-11-17 DIAGNOSIS — R0609 Other forms of dyspnea: Secondary | ICD-10-CM

## 2020-11-17 NOTE — Progress Notes (Addendum)
Cardiology Office Note:    Date:  11/17/2020   ID:  Lindsey Payne, DOB 11-29-1950, MRN [id number]  PCP:  Lindsey Haymaker, MD  Cardiologist:  Lindsey Dresser, MD  Referring MD: Lindsey Haymaker, MD   CC: follow up  History of Present Illness:    Lindsey Payne is a 70 y.o. female with a hx of CAD, chronic systolic heart failure, mixed hyperlipidemia, hypertension, tobacco abuse who is seen for follow up today. I initially met her via telemedicine 01/11/19 (prior Dr. Jennye Payne. Lindsey Payne).  Cardiac history: She reports 2 stents placed with Dr. Wynonia Payne in 2007, though I cannot see this in his notes (he notes nonobstructive CAD). No cath since 2007. Prior EF noted at 35-45% per notes, though Dr. Thurman Payne note from 2015 noted EF 45-50% on echo 07/16/2009. Cath 08/2005: normal LM, ostial LAD 30%, ostial RCA 40%.  Today: She is feeling pretty good Payne. When she walks a certain distance that she describes as not far, she develops chest and left arm pain that feels like it takes her breath away. During this time, she needs to stop and hold the wall due to diaphoresis, dizziness, and shortness of breath. This often happens after waking up in the morning and walking from her bedroom to the kitchen, but does not occur when sitting or otherwise resting. Lately walking is her most strenuous activity. She remains compliant with her medications. Her smoking remains at about 3 cigarettes per day. She is scheduled for a colonoscopy in July. She denies any LE edema, cough, orthopnea, PND, and headaches.  Past Medical History:  Diagnosis Date  . Adenoma of large intestine   . Anxiety   . Arthritis    right hand   . Asthma   . Back pain   . Cataract   . CHF (congestive heart failure) ([place])   . Coronary artery disease   . Depression   . Ganglion cyst    rt wrist  . Heart failure with reduced ejection fraction ([place]) 08/15/2019  . Hemorrhoids   . Hyperlipidemia   . Hypertension    . Loss of teeth due to extraction 09/29/2016  . Lung collapse 1984   left lung, no problems since then  . Sickle cell anemia ([facility])    trait only  . Transfusion history   . Tubular adenoma   . Wears glasses     Past Surgical History:  Procedure Laterality Date  . ABDOMINAL HYSTERECTOMY     has one ovary left  . BACK SURGERY     3 disc removed  . CESAREAN SECTION     1983  . COLON SURGERY  1987   benign tubulovillous adenoma  . COLONOSCOPY N/A 07/22/2017   Procedure: COLONOSCOPY;  Surgeon: Lindsey Overall, MD;  Location: [facility];  Service: General;  Laterality: N/A;  NO ANESTHESIA PER DR. Lucia Gaskins  . COLONOSCOPY    . CORONARY ANGIOPLASTY  2005   w 2 stents  . GANGLION CYST EXCISION Right 10/07/2016   Procedure: Right wrist volar ganglion excision;  Surgeon: Lindsey Planas, MD;  Location: [facility];  Service: Orthopedics;  Laterality: Right;  . Heart stints     Dr. Wynonia Payne  . LUNG SURGERY Left    for collasped lung- in the 1980s  . TONSILLECTOMY    . TOTAL HIP ARTHROPLASTY Right 05/13/2015   Procedure: RIGHT TOTAL HIP ARTHROPLASTY ANTERIOR APPROACH;  Surgeon: Lindsey Rossetti, MD;  Location: [facility];  Service: Orthopedics;  Laterality: Right;  Current Medications: Current Outpatient Medications on File Prior to Visit  Medication Sig  . aspirin 81 MG EC tablet Take 1 tablet (81 mg total) by mouth daily.  . furosemide (LASIX) 40 MG tablet TAKE ONE-HALF TABLET BY  MOUTH TWICE DAILY  . hydrALAZINE (APRESOLINE) 50 MG tablet TAKE ONE-HALF TABLET BY  MOUTH TWICE DAILY  . isosorbide dinitrate (ISORDIL) 20 MG tablet TAKE 1 TABLET BY MOUTH  TWICE DAILY  . lisinopril (ZESTRIL) 20 MG tablet Take 1 tablet (20 mg total) by mouth daily.  . metoprolol succinate (TOPROL-XL) 25 MG 24 hr tablet TAKE 1 TABLET BY MOUTH  TWICE DAILY  . NICOTROL 10 MG inhaler USE 1 CARTRIDGE INTO THE  LUNGS EVERY 6 HOURS AS  NEEDED FOR SMOKING  CESSATION, FOR SMOKING  CRAVING  . oxymetazoline  (AFRIN) 0.05 % nasal spray Place 1 spray into both nostrils 2 (two) times daily as needed for congestion.  Marland Kitchen PARoxetine (PAXIL) 40 MG tablet TAKE 1 TABLET BY MOUTH IN  THE MORNING  . potassium chloride SA (KLOR-CON) 20 MEQ tablet TAKE 1 TABLET BY MOUTH  TWICE DAILY  . prednisoLONE acetate (PRED FORTE) 1 % ophthalmic suspension Place into the left eye.  . rosuvastatin (CRESTOR) 20 MG tablet TAKE 1 TABLET BY MOUTH  DAILY  . spironolactone (ALDACTONE) 25 MG tablet TAKE 1 TABLET BY MOUTH  DAILY  . nitroGLYCERIN (NITROSTAT) 0.4 MG SL tablet Place 1 tablet (0.4 mg total) under the tongue every 5 (five) minutes as needed.   No current facility-administered medications on file prior to visit.     Allergies:   Valium [diazepam]   Social History   Tobacco Use  . Smoking status: Current Every Day Smoker    Packs/day: 0.25    Years: 50.00    Pack years: 12.50    Types: Cigarettes    Start date: 07/12/1958  . Smokeless tobacco: Never Used  . Tobacco comment: smoking 0-3 cigs per day  Vaping Use  . Vaping Use: Never used  Substance Use Topics  . Alcohol use: Yes    Alcohol/week: 1.0 standard drink    Types: 1 Cans of beer per week  . Drug use: No    Family History: family history includes Breast cancer in her mother; Cancer (age of onset: 24) in her sister; Colon cancer (age of onset: 64) in her father; Diabetes in her father; Heart disease in her father, maternal grandfather, and mother; Hypertension in her son; Stroke in her maternal grandmother. There is no history of Esophageal cancer, Stomach cancer, or Rectal cancer.  ROS:   Please see the history of present illness.  (+) Chest pain (+) Left arm pain (+) Shortness of breath (+) Dizziness (+) Diaphoresis   Additional pertinent ROS otherwise unremarkable.  EKGs/Labs/Other Studies Reviewed:    The following studies were reviewed today: Prior summarized in HPI  EKG:   08/20/2020: NSR, abnormal R wave progression, small Q in III,  aVF. This is similar to prior. 11/17/2020: EKG is not ordered today.  Recent Labs: No results found for requested labs within last 8760 hours.  Recent Lipid Panel    Component Value Date/Time   CHOL 131 08/15/2019 1141   TRIG 137 08/15/2019 1141   HDL 43 08/15/2019 1141   CHOLHDL 3.0 08/15/2019 1141   CHOLHDL 7.0 (H) 07/08/2016 0858   VLDL 55 (H) 07/08/2016 0858   LDLCALC 64 08/15/2019 1141   LDLDIRECT 74 02/21/2017 0915   LDLDIRECT 135 (H) 05/01/2012 6761  Physical Exam:    VS:  BP 130/80 (BP Location: Left Arm, Patient Position: Sitting, Cuff Size: Normal)   Pulse 70   Ht '5\' 9"'  (1.753 m)   Wt 207 lb 9.6 oz (94.2 kg)   SpO2 99%   BMI 30.66 kg/m     Wt Readings from Last 3 Encounters:  11/17/20 207 lb 9.6 oz (94.2 kg)  10/29/20 207 lb (93.9 kg)  10/15/20 207 lb (93.9 kg)    GEN: Well nourished, well developed in no acute distress HEENT: Normal, moist mucous membranes NECK: No JVD CARDIAC: regular rhythm, normal S1 and S2, no rubs or gallops. No murmur. VASCULAR: Radial and DP pulses 2+ bilaterally. No carotid bruits RESPIRATORY:  Clear to auscultation without rales, wheezing or rhonchi  ABDOMEN: Soft, non-tender, non-distended MUSCULOSKELETAL:  Ambulates independently SKIN: Warm and dry, no edema NEUROLOGIC:  Alert and oriented x 3. No focal neuro deficits noted. PSYCHIATRIC:  Normal affect   ASSESSMENT:    1. Dyspnea on exertion   2. Mixed hyperlipidemia   3. Essential hypertension   4. Coronary artery disease involving native coronary artery of native heart without angina pectoris   5. TOBACCO ABUSE   6. Cardiac risk counseling   7. Counseling on health promotion and disease prevention   8. Tobacco abuse counseling   9. History of heart failure    PLAN:    Dyspnea on exertion Continues to have dyspnea on exertion, can walk from room to room but that is maximum distance. Feels presyncopal, but no full syncope. Gets diaphoretic as well. Occasional chest  tightness. Never nonexertional.   Reviewed recent nuclear stress test and echo results, reassuring.   Consider pulmonary function tests or other evaluation given cigarette smoking history, defer to PCP.  Hyperlipidemia On rosuvastatin, continue  Essential hypertension Well controlled today  CAD (coronary artery disease) Normal stress and echo recently. Continue aspirin and statin. Counseled on red flag signs that need immediate medical attention.  Dr. Thurman Payne notes state prior nonobstructive CAD, though she thought she has had prior stents.  TOBACCO ABUSE At 3 cigarettes/day.  Tobacco abuse counseling The patient was counseled on tobacco cessation today for 4 minutes.  Counseling included reviewing the risks of smoking tobacco products, how it impacts the patient's current medical diagnoses and different strategies for quitting.  Pharmacotherapy to aid in tobacco cessation was not prescribed today.  History of heart failure Reported prior chronic systolic heart failure, but EF normal on most recent echo. Continue hydralazine, furosemide 20 mg daily, isordil, lisinopril, metoprolol succinate, spironolactone.  Secondary prevention: -recommend heart healthy/Mediterranean diet, with whole grains, fruits, vegetable, fish, lean meats, nuts, and olive oil. Limit salt. -recommend moderate walking, 3-5 times/week for 30-50 minutes each session. Aim for at least 150 minutes.week. Goal should be pace of 3 miles/hours, or walking 1.5 miles in 30 minutes -recommend avoidance of tobacco products. Avoid excess alcohol.  Plan for follow up: 1 year.  Medication Adjustments/Labs and Tests Ordered: Current medicines are reviewed at length with the patient today.  Concerns regarding medicines are outlined above.  No orders of the defined types were placed in this encounter.  No orders of the defined types were placed in this encounter.   Patient Instructions    Follow-Up: At [facility],  you and your health needs are our priority.  As part of our continuing mission to provide you with exceptional heart care, we have created designated Provider Care Teams.  These Care Teams include your primary Cardiologist (physician)  and Advanced Practice Providers (APPs -  Physician Assistants and Nurse Practitioners) who all work together to provide you with the care you need, when you need it.  We recommend signing up for the patient portal called "MyChart".  Sign up information is provided on this After Visit Summary.  MyChart is used to connect with patients for Virtual Visits (Telemedicine).  Patients are able to view lab/test results, encounter notes, upcoming appointments, etc.  Non-urgent messages can be sent to your provider as well.   To learn more about what you can do with MyChart, go to [email or web address].    Your next appointment:   12 month(s)  The format for your next appointment:   In Person  Provider:   Buford Dresser, MD       [facility] Stump[place],acting as a scribe for Lindsey Dresser, MD.,have documented all relevant documentation on the behalf of Lindsey Dresser, MD,as directed by  Lindsey Dresser, MD while in the presence of Lindsey Dresser, MD.  I, Lindsey Dresser, MD, have reviewed all documentation for this visit. The documentation on 11/17/20 for the exam, diagnosis, procedures, and orders are all accurate and complete.  Signed, Lindsey Dresser, MD PhD, FACC 11/17/2020  [facility]

## 2020-11-17 NOTE — Assessment & Plan Note (Signed)
At 3 cigarettes/day.

## 2020-11-17 NOTE — Assessment & Plan Note (Addendum)
Normal stress and echo recently. Continue aspirin and statin. Counseled on red flag signs that need immediate medical attention.  Dr. Thurman Coyer notes state prior nonobstructive CAD, though she thought she has had prior stents.

## 2020-11-17 NOTE — Assessment & Plan Note (Addendum)
Continues to have dyspnea on exertion, can walk from room to room but that is maximum distance. Feels presyncopal, but no full syncope. Gets diaphoretic as well. Occasional chest tightness. Never nonexertional.   Reviewed recent nuclear stress test and echo results, reassuring.   Consider pulmonary function tests or other evaluation given cigarette smoking history, defer to PCP.

## 2020-11-17 NOTE — Assessment & Plan Note (Signed)
On rosuvastatin, continue

## 2020-11-17 NOTE — Assessment & Plan Note (Signed)
Well controlled today.

## 2020-11-17 NOTE — Assessment & Plan Note (Signed)
The patient was counseled on tobacco cessation today for 4 minutes.  Counseling included reviewing the risks of smoking tobacco products, how it impacts the patient's current medical diagnoses and different strategies for quitting.  Pharmacotherapy to aid in tobacco cessation was not prescribed today.

## 2020-11-17 NOTE — Assessment & Plan Note (Signed)
Reported prior chronic systolic heart failure, but EF normal on most recent echo. Continue hydralazine, furosemide 20 mg daily, isordil, lisinopril, metoprolol succinate, spironolactone.

## 2020-11-17 NOTE — Patient Instructions (Signed)
   Follow-Up: At [facility], you and your health needs are our priority.  As part of our continuing mission to provide you with exceptional heart care, we have created designated Provider Care Teams.  These Care Teams include your primary Cardiologist (physician) and Advanced Practice Providers (APPs -  Physician Assistants and Nurse Practitioners) who all work together to provide you with the care you need, when you need it.  We recommend signing up for the patient portal called "MyChart".  Sign up information is provided on this After Visit Summary.  MyChart is used to connect with patients for Virtual Visits (Telemedicine).  Patients are able to view lab/test results, encounter notes, upcoming appointments, etc.  Non-urgent messages can be sent to your provider as well.   To learn more about what you can do with MyChart, go to [email or web address].    Your next appointment:   12 month(s)  The format for your next appointment:   In Person  Provider:   Buford Dresser, MD

## 2020-11-18 ENCOUNTER — Ambulatory Visit: Payer: Medicare Other | Admitting: Podiatry

## 2020-11-21 ENCOUNTER — Other Ambulatory Visit: Payer: Self-pay

## 2020-11-21 ENCOUNTER — Ambulatory Visit (INDEPENDENT_AMBULATORY_CARE_PROVIDER_SITE_OTHER): Payer: Medicare Other | Admitting: Podiatry

## 2020-11-21 DIAGNOSIS — M2042 Other hammer toe(s) (acquired), left foot: Secondary | ICD-10-CM

## 2020-11-21 DIAGNOSIS — L84 Corns and callosities: Secondary | ICD-10-CM | POA: Diagnosis not present

## 2020-11-23 NOTE — Progress Notes (Signed)
Subjective:   Patient ID: Lindsey Payne, female   DOB: 70 y.o.   MRN: [id number]   HPI Patient presents with thick yellow brittle nailbeds 1-5 both feet and lesions on both feet that become painful   ROS      Objective:  Physical Exam  Ocular status intact with thick yellow brittle nailbeds 1-5 both feet and lesions bilateral painful     Assessment:  Chronic mycotic nail infection 1-5 both feet with pain along with lesion formation with pain bilateral     Plan:  H&P reviewed debrided nailbeds 1-5 both feet and lesions bilateral no iatrogenic bleeding reappoint routine care

## 2020-11-26 ENCOUNTER — Other Ambulatory Visit: Payer: Self-pay

## 2020-11-26 ENCOUNTER — Ambulatory Visit (INDEPENDENT_AMBULATORY_CARE_PROVIDER_SITE_OTHER): Payer: Medicare Other | Admitting: Family Medicine

## 2020-11-26 ENCOUNTER — Encounter: Payer: Self-pay | Admitting: Family Medicine

## 2020-11-26 VITALS — BP 130/80 | HR 62 | Ht 69.0 in | Wt 206.0 lb

## 2020-11-26 DIAGNOSIS — I10 Essential (primary) hypertension: Secondary | ICD-10-CM

## 2020-11-26 DIAGNOSIS — I2510 Atherosclerotic heart disease of native coronary artery without angina pectoris: Secondary | ICD-10-CM

## 2020-11-26 DIAGNOSIS — M25511 Pain in right shoulder: Secondary | ICD-10-CM

## 2020-11-26 DIAGNOSIS — M5136 Other intervertebral disc degeneration, lumbar region: Secondary | ICD-10-CM

## 2020-11-26 DIAGNOSIS — M51369 Other intervertebral disc degeneration, lumbar region without mention of lumbar back pain or lower extremity pain: Secondary | ICD-10-CM

## 2020-11-26 DIAGNOSIS — E785 Hyperlipidemia, unspecified: Secondary | ICD-10-CM

## 2020-11-26 DIAGNOSIS — F17200 Nicotine dependence, unspecified, uncomplicated: Secondary | ICD-10-CM | POA: Diagnosis not present

## 2020-11-26 DIAGNOSIS — Z8679 Personal history of other diseases of the circulatory system: Secondary | ICD-10-CM

## 2020-11-26 HISTORY — DX: Pain in right shoulder: M25.511

## 2020-11-26 NOTE — Patient Instructions (Addendum)
Shoulder pain: Does not look like there is any muscular or soft tissue trouble with your shoulder.  I suspect this may be related to some shoulder arthritis.  Tylenol is probably the best medicine for now.  If your shoulder pain gets worse, we can try a steroid injection to see if that is helpful.  Low back pain: It sounds like this is an old problem.  I am sorry to get a little bit worse in the past month.  It does not seem like there is any significant hip pain so I do not think this is worsened arthritis of your hip.  I do not think that there is any quick fix to this back pain.  If it is significantly worsened, I would recommend returning to your orthopedist.  I will get some labs today to make sure your kidney function and electrolytes and cholesterol are all normal.

## 2020-11-26 NOTE — Assessment & Plan Note (Signed)
This seems to be an old problem for her she has previously seen orthopedics.  It seems like her low back pain has grown worse in the past month or so.  She is not currently having any red flag symptoms.  She was encouraged to continue to use Tylenol for pain control.  She was counseled to avoid NSAIDs for pain control.  She was not previously interested in surgery when this was discussed with orthopedics.  We do not see any reason for additional imaging at this time.  She was told that if she felt she was ready to discuss this with orthopedics again that would be appropriate.

## 2020-11-26 NOTE — Progress Notes (Deleted)
    SUBJECTIVE:   CHIEF COMPLAINT / HPI:   ***  PERTINENT  PMH / PSH: ***  OBJECTIVE:   BP 130/80   Pulse 62   Ht 5\' 9"  (1.753 m)   Wt 206 lb (93.4 kg)   SpO2 99%   BMI 30.42 kg/m   ***  ASSESSMENT/PLAN:   No problem-specific Assessment & Plan notes found for this encounter.     Matilde Haymaker, MD [place]   {    This will disappear when note is signed, click to select method of visit    :1}

## 2020-11-26 NOTE — Progress Notes (Signed)
SUBJECTIVE:   CHIEF COMPLAINT / HPI:   Left low back pain Lindsey Payne reports she has had a long history of degenerative disc disease and has previously been seen by orthopedics for this issue.  She reports that she has been having worsening lower back pain in the past month or so.  The back pain seems to be aching or pressure that she is notices most when she is trying to walk around.  She does not have any shooting pain down her leg.  She denies any pain from the inside of her hip.  Overall, she feels that this is consistent with a previous pain which is seems to be slowly getting worse.  She has occasionally taken Tylenol at home for this issue without significant relief.  She has previously discussed this issue with orthopedics who recommended back surgery but she was not in favor of surgery at that time.  Right shoulder pain She has noticed some mild right shoulder discomfort for roughly the past 2 weeks.  The discomfort seems to be coming from the backside of her shoulder.  She occasionally notices this pain at night or when she is moving around the house.  She cannot pinpoint specific movements that seem to exacerbate it.  She does not remember any specific injuries to her shoulder.  Heart failure Her current medications include: -Lisinopril 20 mg daily -Isosorbide dinitrate 20 mg BID -Hydralazine 50 mg once daily -Metoprolol 25 mg daily -Spironolactone 25 mg daily She reports she was recently seen by her cardiologist to give her a clean bill of health.  She has no concerns regarding shortness of breath at this time.  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  No respiratory distress Extremities: No peripheral edema. Warm/ well perfused.  Strong radial pulses. Neuro: Cranial nerves grossly intact  Shoulder: Inspection reveals no obvious deformity, atrophy, or asymmetry. No bruising. No swelling Palpation is normal with no TTP over [facility] joint or bicipital groove. Full ROM in flexion, abduction, internal/external rotation NV intact distally Normal scapular function observed. Special Tests:  - Impingement: Neg Hawkins, neers, empty can sign. - Supraspinatous: Negative empty can.  5/5 strength with resisted flexion at 20 degrees - Infraspinatous/Teres Minor: 5/5 strength with ER - Subscapularis: negative belly press, negative bear hug. 5/5 strength with IR - Biceps tendon: Negative Speeds, Yerrgason's  - Labrum: Negative Obriens  Lumbar spine: - Inspection: no gross deformity or asymmetry, swelling - Palpation: No TTP over the spinous processes, paraspinal muscles, or SI joints b/l - ROM: full active ROM of the lumbar spine in flexion and extension without pain - Neuro: sensation intact in the L4-S1 nerve root distribution b/l, 2+ L4 and S1 reflexes - Special testing: negative slump, no pain with internal or external rotation of the hip. - Antalgic gait around the exam room.  She does require a cane in order to walk.  She is also uncomfortable for standing for periods longer than several minutes.  ASSESSMENT/PLAN:   History of heart failure Well-controlled at this time.  Continues to follow with heart failure. -Follow-up BMP -Continue current medications  CAD (coronary artery disease) Well-controlled at this time. -Follow-up lipid panel -Continue current medications  TOBACCO ABUSE Currently smoking 2-3 cigarettes/day.  Not currently interested in quitting.  Degenerative disc disease, lumbar This  seems to be an old problem for her she has previously seen orthopedics.  It seems like her low back pain has grown worse in the past month or so.  She is not currently having any red flag symptoms.  She was encouraged to continue to use Tylenol for pain control.  She was counseled to avoid NSAIDs for pain control.  She was not previously interested in surgery when this was discussed with orthopedics.  We do not see any reason for additional imaging at this time.  She was told that if she felt she was ready to discuss this with orthopedics again that would be appropriate.  Right shoulder pain This seems to be a new problem.  No evidence of trauma.  No evidence of muscle weakness or shoulder impingement on exam today.  Low suspicion for labral injury.  I think it is more likely related to arthritis of the right shoulder.  She was told that she can continue to use Tylenol for pain control if that is sufficient.  Tylenol is not adequate, I be happy to try a steroid injection.  She is not interested in a steroid injection today.     Lindsey Haymaker, MD [facility]

## 2020-11-26 NOTE — Assessment & Plan Note (Signed)
Well-controlled at this time.  Continues to follow with heart failure. -Follow-up BMP -Continue current medications

## 2020-11-26 NOTE — Assessment & Plan Note (Signed)
Currently smoking 2-3 cigarettes/day.  Not currently interested in quitting.

## 2020-11-26 NOTE — Assessment & Plan Note (Signed)
Well-controlled at this time. -Follow-up lipid panel -Continue current medications

## 2020-11-26 NOTE — Assessment & Plan Note (Signed)
This seems to be a new problem.  No evidence of trauma.  No evidence of muscle weakness or shoulder impingement on exam today.  Low suspicion for labral injury.  I think it is more likely related to arthritis of the right shoulder.  She was told that she can continue to use Tylenol for pain control if that is sufficient.  Tylenol is not adequate, I be happy to try a steroid injection.  She is not interested in a steroid injection today.

## 2020-11-27 LAB — BASIC METABOLIC PANEL
BUN/Creatinine Ratio: 11 — ABNORMAL LOW (ref 12–28)
BUN: 11 mg/dL (ref 8–27)
CO2: 23 mmol/L (ref 20–29)
Calcium: 9.5 mg/dL (ref 8.7–10.3)
Chloride: 102 mmol/L (ref 96–106)
Creatinine, Ser: 1.01 mg/dL — ABNORMAL HIGH (ref 0.57–1.00)
Glucose: 91 mg/dL (ref 65–99)
Potassium: 4.2 mmol/L (ref 3.5–5.2)
Sodium: 137 mmol/L (ref 134–144)
eGFR: 60 mL/min/{1.73_m2} (ref >59)

## 2020-11-27 LAB — LIPID PANEL
Chol/HDL Ratio: 2.7 ratio (ref 0.0–4.4)
Cholesterol, Total: 140 mg/dL (ref 100–199)
HDL: 51 mg/dL (ref >39)
LDL Chol Calc (NIH): 67 mg/dL (ref 0–99)
Triglycerides: 123 mg/dL (ref 0–149)
VLDL Cholesterol Cal: 22 mg/dL (ref 5–40)

## 2020-12-18 ENCOUNTER — Other Ambulatory Visit: Payer: Self-pay | Admitting: Family Medicine

## 2021-01-15 ENCOUNTER — Ambulatory Visit (AMBULATORY_SURGERY_CENTER): Payer: Medicare Other

## 2021-01-15 ENCOUNTER — Other Ambulatory Visit: Payer: Self-pay

## 2021-01-15 VITALS — Ht 69.0 in | Wt 207.0 lb

## 2021-01-15 DIAGNOSIS — Z86010 Personal history of colonic polyps: Secondary | ICD-10-CM

## 2021-01-15 MED ORDER — GOLYTELY 236 G PO SOLR
4000.0000 mL | ORAL | 0 refills | Status: DC
Start: 2021-01-15 — End: 2021-01-28

## 2021-01-15 NOTE — Progress Notes (Signed)
Pre visit completed via phone call; patient verified name, DOB, and address;  No egg or soy allergy known to patient  No issues with past sedation with any surgeries or procedures Patient denies ever being told they had issues or difficulty with intubation  No FH of Malignant Hyperthermia No diet pills per patient No home 02 use per patient  No blood thinners per patient  Pt denies issues with constipation at this time; No A fib or A flutter   COVID 19 guidelines implemented in PV today with Pt and RN  Pt is fully vaccinated for Covid   NO PA's for preps discussed with pt in PV today  Discussed with pt there will be an out-of-pocket cost for prep and that varies from $0 to 70 dollars   Due to the COVID-19 pandemic we are asking patients to follow certain guidelines.  Pt aware of COVID protocols and [place] guidelines   Patient requested a copy of the consent not be sent to her as she already has a copy of it at home;

## 2021-01-28 ENCOUNTER — Encounter: Payer: Self-pay | Admitting: Gastroenterology

## 2021-01-28 ENCOUNTER — Other Ambulatory Visit: Payer: Self-pay

## 2021-01-28 ENCOUNTER — Ambulatory Visit (AMBULATORY_SURGERY_CENTER): Payer: Medicare Other | Admitting: Gastroenterology

## 2021-01-28 VITALS — BP 143/77 | HR 57 | Temp 97.8°F | Resp 21 | Ht 69.0 in | Wt 207.0 lb

## 2021-01-28 DIAGNOSIS — Z1211 Encounter for screening for malignant neoplasm of colon: Secondary | ICD-10-CM | POA: Diagnosis not present

## 2021-01-28 DIAGNOSIS — Z86010 Personal history of colonic polyps: Secondary | ICD-10-CM | POA: Diagnosis not present

## 2021-01-28 DIAGNOSIS — D123 Benign neoplasm of transverse colon: Secondary | ICD-10-CM

## 2021-01-28 DIAGNOSIS — D124 Benign neoplasm of descending colon: Secondary | ICD-10-CM | POA: Diagnosis not present

## 2021-01-28 MED ORDER — SODIUM CHLORIDE 0.9 % IV SOLN: 500.0000 mL | Freq: Once | INTRAVENOUS | Status: DC

## 2021-01-28 NOTE — Patient Instructions (Signed)
YOU HAD AN ENDOSCOPIC PROCEDURE TODAY AT THE [facility]:   Refer to the procedure report that was given to you for any specific questions about what was found during the examination.  If the procedure report does not answer your questions, please call your gastroenterologist to clarify.  If you requested that your care partner not be given the details of your procedure findings, then the procedure report has been included in a sealed envelope for you to review at your convenience later.  YOU SHOULD EXPECT: Some feelings of bloating in the abdomen. Passage of more gas than usual.  Walking can help get rid of the air that was put into your GI tract during the procedure and reduce the bloating. If you had a lower endoscopy (such as a colonoscopy or flexible sigmoidoscopy) you may notice spotting of blood in your stool or on the toilet paper. If you underwent a bowel prep for your procedure, you may not have a normal bowel movement for a few days.  Please Note:  You might notice some irritation and congestion in your nose or some drainage.  This is from the oxygen used during your procedure.  There is no need for concern and it should clear up in a day or so.  SYMPTOMS TO REPORT IMMEDIATELY:   Following lower endoscopy (colonoscopy or flexible sigmoidoscopy):  Excessive amounts of blood in the stool  Significant tenderness or worsening of abdominal pains  Swelling of the abdomen that is new, acute  Fever of 100F or higher  For urgent or emergent issues, a gastroenterologist can be reached at any hour by calling [phone]. Do not use MyChart messaging for urgent concerns.    DIET:  We do recommend a small meal at first, but then you may proceed to your regular diet.  Drink plenty of fluids but you should avoid alcoholic beverages for 24 hours.  ACTIVITY:  You should plan to take it easy for the rest of today and you should NOT DRIVE or use heavy machinery until tomorrow (because  of the sedation medicines used during the test).    FOLLOW UP: Our staff will call the number listed on your records 48-72 hours following your procedure to check on you and address any questions or concerns that you may have regarding the information given to you following your procedure. If we do not reach you, we will leave a message.  We will attempt to reach you two times.  During this call, we will ask if you have developed any symptoms of COVID 19. If you develop any symptoms (ie: fever, flu-like symptoms, shortness of breath, cough etc.) before then, please call [phone].  If you test positive for Covid 19 in the 2 weeks post procedure, please call and report this information to us.    If any biopsies were taken you will be contacted by phone or by letter within the next 1-3 weeks.  Please call us at [phone] if you have not heard about the biopsies in 3 weeks.    SIGNATURES/CONFIDENTIALITY: You and/or your care partner have signed paperwork which will be entered into your electronic medical record.  These signatures attest to the fact that that the information above on your After Visit Summary has been reviewed and is understood.  Full responsibility of the confidentiality of this discharge information lies with you and/or your care-partner. 

## 2021-01-28 NOTE — Progress Notes (Signed)
Pt's states no medical or surgical changes since previsit or office visit. 

## 2021-01-28 NOTE — Op Note (Addendum)
[facility] Patient Name: Lindsey Payne Procedure Date: 01/28/2021 8:01 AM MRN: [id number] Endoscopist: Ladene Artist , MD Age: 70 Referring MD:  Date of Birth: 02-09-1951 Gender: Female Account #: [id number] Procedure:                Colonoscopy Indications:              Surveillance: Personal history of adenomatous                            polyps, inadequate prep on last colonoscopy (less                            than 1 year ago) Medicines:                Monitored Anesthesia Care Procedure:                Pre-Anesthesia Assessment:                           - Prior to the procedure, a History and Physical                            was performed, and patient medications and                            allergies were reviewed. The patient's tolerance of                            previous anesthesia was also reviewed. The risks                            and benefits of the procedure and the sedation                            options and risks were discussed with the patient.                            All questions were answered, and informed consent                            was obtained. Prior Anticoagulants: The patient has                            taken no previous anticoagulant or antiplatelet                            agents. ASA Grade Assessment: III - A patient with                            severe systemic disease. After reviewing the risks                            and benefits, the patient was deemed in  satisfactory condition to undergo the procedure.                           After obtaining informed consent, the colonoscope                            was passed under direct vision. Throughout the                            procedure, the patient's blood pressure, pulse, and                            oxygen saturations were monitored continuously. The                            CF [id number] #[id number] was introduced  through the anus                            and advanced to the the cecum, identified by                            appendiceal orifice and ileocecal valve. The                            ileocecal valve, appendiceal orifice, and rectum                            were photographed. The quality of the bowel                            preparation was adequate. The colonoscopy was                            performed without difficulty. The patient tolerated                            the procedure well. Scope In: 8:09:12 AM Scope Out: 8:23:58 AM Scope Withdrawal Time: 0 hours 11 minutes 39 seconds  Total Procedure Duration: 0 hours 14 minutes 46 seconds  Findings:                 The perianal and digital rectal examinations were                            normal.                           There was evidence of a prior end-to-side                            ileo-colonic anastomosis in the ascending colon.                            This was patent and was characterized by healthy  appearing mucosa. The anastomosis was not traversed.                           Two sessile polyps were found in the descending                            colon and transverse colon. The polyps were 6 to 7                            mm in size. These polyps were removed with a cold                            snare. Resection and retrieval were complete.                           Multiple small-mouthed diverticula were found in                            the entire colon. There was no evidence of                            diverticular bleeding.                           Internal hemorrhoids were found during                            retroflexion. The hemorrhoids were small and Grade                            I (internal hemorrhoids that do not prolapse).                           The exam was otherwise without abnormality on                            direct and retroflexion  views. Complications:            No immediate complications. Estimated blood loss:                            None. Estimated Blood Loss:     Estimated blood loss: none. Impression:               - Patent end-to-side ileo-colonic anastomosis,                            characterized by healthy appearing mucosa.                           - Two 6 to 7 mm polyps in the descending colon and                            in the transverse colon, removed with a cold snare.  Resected and retrieved.                           - Moderate diverticulosis in the entire examined                            colon.                           - Internal hemorrhoids.                           - The examination was otherwise normal on direct                            and retroflexion views. Recommendation:           - Repeat colonoscopy after studies are complete for                            surveillance based on pathology results with an                            extended bowel prep.                           - Patient has a contact number available for                            emergencies. The signs and symptoms of potential                            delayed complications were discussed with the                            patient. Return to normal activities tomorrow.                            Written discharge instructions were provided to the                            patient.                           - Resume previous diet.                           - Continue present medications.                           - Await pathology results. Ladene Artist, MD 01/28/2021 8:32:23 AM This report has been signed electronically.

## 2021-01-28 NOTE — Progress Notes (Signed)
A and O x3. Report to RN. Tolerated MAC anesthesia well. 

## 2021-01-30 ENCOUNTER — Telehealth: Payer: Self-pay | Admitting: *Deleted

## 2021-01-30 NOTE — Telephone Encounter (Signed)
  Follow up Call-  Call back number 01/28/2021 10/29/2020  Post procedure Call Back phone  # [phone]  Permission to leave phone message Yes Yes  Some recent data might be hidden     Patient questions:  Do you have a fever, pain , or abdominal swelling? No. Pain Score  0 *  Have you tolerated food without any problems? Yes.    Have you been able to return to your normal activities? Yes.    Do you have any questions about your discharge instructions: Diet   No. Medications  No. Follow up visit  No.  Do you have questions or concerns about your Care? No.  Actions: * If pain score is 4 or above: No action needed, pain <4. Have you developed a fever since your procedure? no  2.   Have you had an respiratory symptoms (SOB or cough) since your procedure? no  3.   Have you tested positive for COVID 19 since your procedure no  4.   Have you had any family members/close contacts diagnosed with the COVID 19 since your procedure?  no   If yes to any of these questions please route to Joylene John, RN and Joella Prince, RN

## 2021-02-06 ENCOUNTER — Encounter: Payer: Self-pay | Admitting: Gastroenterology

## 2021-02-07 ENCOUNTER — Other Ambulatory Visit: Payer: Self-pay | Admitting: Family Medicine

## 2021-02-11 ENCOUNTER — Other Ambulatory Visit: Payer: Self-pay | Admitting: Family Medicine

## 2021-03-05 ENCOUNTER — Other Ambulatory Visit: Payer: Self-pay | Admitting: Family Medicine

## 2021-04-10 DIAGNOSIS — H35359 Cystoid macular degeneration, unspecified eye: Secondary | ICD-10-CM | POA: Diagnosis not present

## 2021-05-28 ENCOUNTER — Other Ambulatory Visit: Payer: Self-pay | Admitting: Family Medicine

## 2021-07-17 ENCOUNTER — Other Ambulatory Visit: Payer: Self-pay | Admitting: Family Medicine

## 2021-07-17 DIAGNOSIS — Z1231 Encounter for screening mammogram for malignant neoplasm of breast: Secondary | ICD-10-CM

## 2021-07-22 ENCOUNTER — Ambulatory Visit (INDEPENDENT_AMBULATORY_CARE_PROVIDER_SITE_OTHER): Payer: Medicare Other | Admitting: Family Medicine

## 2021-07-22 ENCOUNTER — Other Ambulatory Visit: Payer: Self-pay

## 2021-07-22 ENCOUNTER — Encounter: Payer: Self-pay | Admitting: Family Medicine

## 2021-07-22 VITALS — BP 127/78 | HR 71 | Ht 69.0 in | Wt 207.0 lb

## 2021-07-22 DIAGNOSIS — R202 Paresthesia of skin: Secondary | ICD-10-CM | POA: Diagnosis not present

## 2021-07-22 DIAGNOSIS — I10 Essential (primary) hypertension: Secondary | ICD-10-CM | POA: Diagnosis not present

## 2021-07-22 DIAGNOSIS — Z23 Encounter for immunization: Secondary | ICD-10-CM

## 2021-07-22 DIAGNOSIS — F17200 Nicotine dependence, unspecified, uncomplicated: Secondary | ICD-10-CM | POA: Diagnosis not present

## 2021-07-22 DIAGNOSIS — R200 Anesthesia of skin: Secondary | ICD-10-CM

## 2021-07-22 NOTE — Progress Notes (Signed)
° ° °  SUBJECTIVE:   CHIEF COMPLAINT / HPI:   Patient presents to the office for general follow-up. She does not have any acute complaints, though she does note some intermittent issues with initiating her stream when urinating. This is a very infrequent issue and she is able to continue urinating normally afterwards. She does not note any other symptoms at that time including blood in her urine, incontinence, or constipation.   Patient also reports an intermittent tingling sensation in her left hand.  She does note that she sleeps on a flexed arm and that she does prop up her upper body on her elbows quite a bit.  She intermittently notices the tingling/numbness sensation but does not cause her specific pain.  Patient's problem list and medications were reviewed and updated.   PERTINENT  PMH / PSH: Reviewed  OBJECTIVE:   BP 127/78    Pulse 71    Ht 5\' 9"  (1.753 m)    Wt 207 lb (93.9 kg)    SpO2 100%    BMI 30.57 kg/m   Gen: well-appearing, NAD CV: RRR, no m/r/g appreciated, no peripheral edema Pulm: CTAB, no wheezes/crackles GI: soft, non-tender, non-distended Left elbow: - Inspection: no obvious deformity. No swelling, erythema or bruising - Palpation: No TTP - ROM: full active ROM in flexion and extension.  Does note proximal cubital discomfort and reproduction of tingling sensation with supination and pronation of the wrist - Strength: 5/5 strength in wrist flexion and extension without pain. 5/5 strength in biceps, triceps. - Neuro: NV intact distally - Special testing: no laxity with varus/valgus stress, negative milking maneuver. No pain with resisted ECRB or supination. Negative tinel's at radial tunnel  ASSESSMENT/PLAN:   Essential hypertension BP initially elevated at 143/82, was 137/98 on repeat.  Patient to continue current medications of lisinopril, metoprolol, hydralazine, atorvastatin dinitrate. - Continue current medications as prescribed - Follow-up in 6 months for  labs  TOBACCO ABUSE Patient currently smokes about 3 cigarettes/day, she is interested in quitting.  Has previously tried Chantix and has been advised to not do nicotine patches due to history of family cancers.  Discussed with patient who is willing to meet with Dr. Valentina Lucks for tobacco cessation counseling. - Follow-up with Dr. Valentina Lucks   Left hand tingling Patient with intermittent tingling in the left hand, appears to be originating from the elbow and does have reproducible symptoms with pronation and supination of the wrist. Consider irritation secondary to nerve pressure, but does not currently appear to be entrapped based on physical exam. Discussed conservative measures with patient and gave follow-up/return precautions.   Rise Patience, [place]

## 2021-07-22 NOTE — Assessment & Plan Note (Signed)
Patient currently smokes about 3 cigarettes/day, she is interested in quitting.  Has previously tried Chantix and has been advised to not do nicotine patches due to history of family cancers.  Discussed with patient who is willing to meet with Dr. Valentina Lucks for tobacco cessation counseling. - Follow-up with Dr. Valentina Lucks

## 2021-07-22 NOTE — Assessment & Plan Note (Addendum)
BP initially elevated at 143/82, was 137/98 on repeat.  Patient to continue current medications of lisinopril, metoprolol, hydralazine, atorvastatin dinitrate. - Continue current medications as prescribed - Follow-up in 6 months for labs

## 2021-07-22 NOTE — Patient Instructions (Addendum)
It was so great seeing you today! Today we discussed the following:  -I think that the tingling in your fingers is due to pressure on a nerve near your elbow.  I recommend not propping your arms up on your elbows when she can and trying to not keep your arm flexed during sleep.  He can try using muscle creams over the area as well as heat or ice (whichever feels better for you).  -When you go to the front desk, please make sure to schedule an appointment with Dr. Valentina Lucks for tobacco cessation  - You are currently up-to-date on your lab work, we will repeat some of your tests later this year.   - You received your flu shot today.  Please make sure to bring any medications you take to your appointments. If you have any questions or concerns please call the office at [phone].

## 2021-07-27 ENCOUNTER — Ambulatory Visit: Payer: Commercial Managed Care - HMO | Admitting: Pharmacist

## 2021-08-19 ENCOUNTER — Ambulatory Visit
Admission: RE | Admit: 2021-08-19 | Discharge: 2021-08-19 | Disposition: A | Discharge status: Home / Self Care | Payer: Medicare Other | Source: Ambulatory Visit | Attending: Family Medicine | Admitting: Family Medicine

## 2021-08-19 DIAGNOSIS — Z1231 Encounter for screening mammogram for malignant neoplasm of breast: Secondary | ICD-10-CM | POA: Diagnosis not present

## 2021-09-14 ENCOUNTER — Other Ambulatory Visit: Payer: Self-pay

## 2021-09-14 MED ORDER — FUROSEMIDE 20 MG PO TABS: ORAL_TABLET | ORAL | 5 refills | Status: DC

## 2021-09-29 ENCOUNTER — Ambulatory Visit (INDEPENDENT_AMBULATORY_CARE_PROVIDER_SITE_OTHER): Payer: Medicare Other

## 2021-09-29 ENCOUNTER — Other Ambulatory Visit: Payer: Self-pay

## 2021-09-29 DIAGNOSIS — Z0000 Encounter for general adult medical examination without abnormal findings: Secondary | ICD-10-CM

## 2021-09-29 NOTE — Progress Notes (Signed)
? ?Subjective:  ? Lindsey Payne is a 71 y.o. female who presents for Medicare Annual (Subsequent) preventive examination. ? ?Patient consented to have virtual visit and was identified by name and date of birth. ?Method of visit: Telephone ? ?Encounter participants: ?Patient: Lindsey Payne - located at Home ?Nurse/Provider: Dorna Bloom - located at [facility] ?Others (if applicable): NA ? ?Review of Systems: Defer to PCP ? ?Cardiac Risk Factors include: advanced age (>92mn, >>40women);hypertension;smoking/ tobacco exposure ? ?Objective:  ? ?Vitals: There were no vitals taken for this visit.  There is no height or weight on file to calculate BMI. ? ?Advanced Directives 09/29/2021 07/22/2021 11/26/2020 04/07/2020 11/29/2019 03/16/2019 01/09/2019  ?Does Patient Have a Medical Advance Directive? No No No No Yes No Yes  ?Type of Advance Directive - - - - Living will - Healthcare Power of Attorney  ?Does patient want to make changes to medical advance directive? - - - - - - No - Patient declined  ?Copy of HC[place]in Chart? - - - - - - No - copy requested  ?Would patient like information on creating a medical advance directive? Yes (MAU/Ambulatory/Procedural Areas - Information given) No - Patient declined No - Patient declined No - Patient declined - No - Patient declined -  ? ?Tobacco ?Social History  ? ?Tobacco Use  ?Smoking Status Every Day  ? Packs/day: 0.25  ? Years: 50.00  ? Pack years: 12.50  ? Types: Cigarettes  ? Start date: 07/12/1958  ?Smokeless Tobacco Never  ?Tobacco Comments  ? smoking 0-3 cigs per day  ?   ?Ready to quit: Yes ?Counseling given: Yes ?Tobacco comments: smoking 0-3 cigs per day ? ?Clinical Intake: ? ?Pre-visit preparation completed: Yes ? ?Pain Score: 0-No pain ? ?Diabetes: No ? ?How often do you need to have someone help you when you read instructions, pamphlets, or other written materials from your doctor or pharmacy?: 2 - Rarely ?What is the last grade  level you completed in school?: High School ? ?Interpreter Needed?: No ? ?Past Medical History:  ?Diagnosis Date  ? Adenoma of large intestine   ? Anxiety   ? Arthritis   ? right hand   ? Asthma   ? Back pain   ? Cataract   ? CHF (congestive heart failure) ([facility]   ? Coronary artery disease   ? Depression   ? Ganglion cyst   ? rt wrist  ? Heart [place] fraction ([place] 08/15[place]  ? Hemorrhoids   ? Hyperlipidemia   ? Hypertension   ? Loss of teeth due to extraction 09/29/2016  ? Lung collapse 1984  ? left lung, no problems since then  ? Rectal bleeding 01/30/2013  ? Right shoulder pain 11/26/2020  ? Sickle cell anemia (HCC)   ? trait only  ? Transfusion history   ? Tubular adenoma   ? Wears glasses   ? ?Past Surgical History:  ?Procedure Laterality Date  ? ABDOMINAL HYSTERECTOMY    ? has one ovary left  ? BACK SURGERY    ? 3 disc removed  ? CESAREAN SECTION    ? 1983  ? COLON SURGERY  1987  ? benign tubulovillous adenoma  ? COLONOSCOPY N/A 07/22/2017  ? Procedure: COLONOSCOPY;  Surgeon: NAlphonsa Overall MD;  [facility];  Service: General;  Laterality: N/A;  NO ANESTHESIA PER DR. NLucia Gaskins ? COLONOSCOPY  10/2020  ? MS-MAC-poor prep-TA=repeat in 3 months due to poor prep  ?  CORONARY ANGIOPLASTY  2005  ? w 2 stents  ? GANGLION CYST EXCISION Right 10/07/2016  ? Procedure: Right wrist volar ganglion excision;  Surgeon: Iran Planas, MD;  Location: [place];  Service: Orthopedics;  Laterality: Right;  ? Heart stints    ? Dr. Wynonia Lawman  ? LUNG SURGERY Left   ? for collasped lung- in the 1980s  ? POLYPECTOMY  10/2020  ? TA x 1  ? TONSILLECTOMY    ? TOTAL HIP ARTHROPLASTY Right 05/13/2015  ? Procedure: RIGHT TOTAL HIP ARTHROPLASTY ANTERIOR APPROACH;  Surgeon: Mcarthur Rossetti, MD;  Location: [facility];  Service: Orthopedics;  Laterality: Right;  ? ?Family History  ?Problem Relation Age of Onset  ? Cancer Mother   ? Breast cancer Mother   ? Heart disease Mother   ? Cancer Father   ?  Colon polyps Father 54  ? Colon cancer Father 9  ? Diabetes Father   ? Heart disease Father   ? Cancer Sister 71  ?     Breast  ? Hypertension Son   ? Stroke Maternal Grandmother   ? Heart disease Maternal Grandfather   ? Esophageal cancer Neg Hx   ? Stomach cancer Neg Hx   ? Rectal cancer Neg Hx   ? ?Social History  ? ?Socioeconomic History  ? Marital status: Widowed  ?  Spouse name: Not on file  ? Number of children: 1  ? Years of education: 38  ? Highest education level: High school graduate  ?Occupational History  ? Occupation: disabled  ?  Comment: [employer]- back injury  ?Tobacco Use  ? Smoking status: Every Day  ?  Packs/day: 0.25  ?  Years: 50.00  ?  Pack years: 12.50  ?  Types: Cigarettes  ?  Start date: 07/12/1958  ? Smokeless tobacco: Never  ? Tobacco comments:  ?  smoking 0-3 cigs per day  ?Vaping Use  ? Vaping Use: Never used  ?Substance and Sexual Activity  ? Alcohol use: Yes  ?  Alcohol/week: 1.0 standard drink  ?  Types: 1 Cans of beer per week  ? Drug use: No  ? Sexual activity: Not Currently  ?  Birth control/protection: Surgical  ?Other Topics Concern  ? Not on file  ?Social History Narrative  ? Patient lives alone with her dog Chance.  ?   ? Patient is very close with her Son [place]. Patients son takes care of her finances for her, and has recently paid off her home and bought her a car.   ?   ? Enjoys spending time with her son and her dog Chance.   ? ?Social Determinants of Health  ? ?Financial Resource Strain: Low Risk   ? Difficulty of Paying Living Expenses: Not hard at all  ?Food Insecurity: No Food Insecurity  ? Worried About Charity fundraiser in the Last Year: Never true  ? Ran Out of Food in the Last Year: Never true  ?Transportation Needs: No Transportation Needs  ? Lack of Transportation (Medical): No  ? Lack of Transportation (Non-Medical): No  ?Physical Activity: Inactive  ? Days of Exercise per Week: 0 days  ? Minutes of Exercise per Session: 0 min  ?Stress: No Stress  Concern Present  ? Feeling of Stress : Not at all  ?Social Connections: Moderately Integrated  ? Frequency of Communication with Friends and Family: More than three times a week  ? Frequency of Social Gatherings with Friends and Family:  More than three times a week  ? Attends Religious Services: More than 4 times per year  ? Active Member of Clubs or Organizations: Yes  ? Attends Archivist Meetings: More than 4 times per year  ? Marital Status: Widowed  ? ?Outpatient Encounter Medications as of 09/29/2021  ?Medication Sig  ? aspirin 81 MG EC tablet Take 1 tablet (81 mg total) by mouth daily.  ? furosemide (LASIX) 20 MG tablet TAKE ONE TABLET BY  MOUTH TWICE DAILY  ? hydrALAZINE (APRESOLINE) 50 MG tablet TAKE ONE-HALF TABLET BY  MOUTH TWICE DAILY  ? isosorbide dinitrate (ISORDIL) 20 MG tablet TAKE 1 TABLET BY MOUTH  TWICE DAILY  ? lisinopril (ZESTRIL) 20 MG tablet TAKE 1 TABLET BY MOUTH  DAILY  ? metoprolol succinate (TOPROL-XL) 25 MG 24 hr tablet TAKE 1 TABLET BY MOUTH  TWICE DAILY  ? oxymetazoline (AFRIN) 0.05 % nasal spray Place 1 spray into both nostrils 2 (two) times daily as needed for congestion.  ? PARoxetine (PAXIL) 40 MG tablet TAKE 1 TABLET BY MOUTH IN  THE MORNING  ? potassium chloride SA (KLOR-CON) 20 MEQ tablet TAKE 1 TABLET BY MOUTH  TWICE DAILY  ? rosuvastatin (CRESTOR) 20 MG tablet TAKE 1 TABLET BY MOUTH  DAILY  ? spironolactone (ALDACTONE) 25 MG tablet TAKE 1 TABLET BY MOUTH  DAILY  ? nitroGLYCERIN (NITROSTAT) 0.4 MG SL tablet Place 1 tablet (0.4 mg total) under the tongue every 5 (five) minutes as needed. (Patient not taking: Reported on 01/15/2021)  ? ?No facility-administered encounter medications on file as of 09/29/2021.  ? ?Activities of Daily Living ?In your present state of health, do you have any difficulty performing the following activities: 09/29/2021  ?Hearing? N  ?Vision? N  ?Difficulty concentrating or making decisions? N  ?Walking or climbing stairs? N  ?Dressing or bathing? N   ?Doing errands, shopping? N  ?Preparing Food and eating ? N  ?Using the Toilet? N  ?In the past six months, have you accidently leaked urine? N  ?Do you have problems with loss of bowel control? N  ?Managing

## 2021-10-12 NOTE — Patient Instructions (Signed)
Thank you for taking time to come for your Medicare Wellness Visit. I appreciate your ongoing commitment to your health goals. Please review the following plan we discussed and let me know if I can assist you in the future.  ?  ?These are the goals we discussed: ? ? Goals   ? ?  LDL CALC < 100   ?  Quit smoking / using tobacco   ?  Patient is smoking ~3 cigarettes a day. Patient does want to quit all together.   ? ?  ?  Weight (lb) < 192 lb (87.1 kg)   ? ?  ? ?We also discussed recommended health maintenance. Please call our office and schedule a visit. As discussed, you are up to date with everything!  ?Health Maintenance  ?Topic Date Due  ? COVID-19 Vaccine (5 - Booster for Moderna series) 05/18/2021  ? INFLUENZA VACCINE  02/09/2022  ? TETANUS/TDAP  05/01/2022  ? MAMMOGRAM  08/20/2023  ? COLONOSCOPY (Pts 45-65yr Insurance coverage will need to be confirmed)  01/29/2028  ? Pneumonia Vaccine 72 Years old  Completed  ? DEXA SCAN  Completed  ? Hepatitis C Screening  Completed  ? Zoster Vaccines- Shingrix  Completed  ? HPV VACCINES  Aged Out  ? ?PCP apt due ~June for blood pressure.  ?Fill out advance directive packet. ?Discuss next covid booster with PCP. ? ?We also discussed smoking cessation.  ?Call 1800-QUIT-NOW for help with stopping smoking.   ? ?Preventive Care 664Years and Older, Female ?Preventive care refers to lifestyle choices and visits with your health care provider that can promote health and wellness. Preventive care visits are also called wellness exams. ?What can I expect for my preventive care visit? ?Counseling ?Your health care provider may ask you questions about your: ?Medical history, including: ?Past medical problems. ?Family medical history. ?Pregnancy and menstrual history. ?History of falls. ?Current health, including: ?Memory and ability to understand (cognition). ?Emotional well-being. ?Home life and relationship well-being. ?Sexual activity and sexual health. ?Lifestyle,  including: ?Alcohol, nicotine or tobacco, and drug use. ?Access to firearms. ?Diet, exercise, and sleep habits. ?Work and work eStatistician ?Sunscreen use. ?Safety issues such as seatbelt and bike helmet use. ?Physical exam ?Your health care provider will check your: ?Height and weight. These may be used to calculate your BMI (body mass index). BMI is a measurement that tells if you are at a healthy weight. ?Waist circumference. This measures the distance around your waistline. This measurement also tells if you are at a healthy weight and may help predict your risk of certain diseases, such as type 2 diabetes and high blood pressure. ?Heart rate and blood pressure. ?Body temperature. ?Skin for abnormal spots. ?What immunizations do I need? ?Vaccines are usually given at various ages, according to a schedule. Your health care provider will recommend vaccines for you based on your age, medical history, and lifestyle or other factors, such as travel or where you work. ?What tests do I need? ?Screening ?Your health care provider may recommend screening tests for certain conditions. This may include: ?Lipid and cholesterol levels. ?Hepatitis C test. ?Hepatitis B test. ?HIV (human immunodeficiency virus) test. ?STI (sexually transmitted infection) testing, if you are at risk. ?Lung cancer screening. ?Colorectal cancer screening. ?Diabetes screening. This is done by checking your blood sugar (glucose) after you have not eaten for a while (fasting). ?Mammogram. Talk with your health care provider about how often you should have regular mammograms. ?BRCA-related cancer screening. This may be done if you  have a family history of breast, ovarian, tubal, or peritoneal cancers. ?Bone density scan. This is done to screen for osteoporosis. ?Talk with your health care provider about your test results, treatment options, and if necessary, the need for more tests. ?Follow these instructions at home: ?Eating and drinking ? ?Eat a  diet that includes fresh fruits and vegetables, whole grains, lean protein, and low-fat dairy products. Limit your intake of foods with high amounts of sugar, saturated fats, and salt. ?Take vitamin and mineral supplements as recommended by your health care provider. ?Do not drink alcohol if your health care provider tells you not to drink. ?If you drink alcohol: ?Limit how much you have to 0-1 drink a day. ?Know how much alcohol is in your drink. In the [place] one drink equals one 12 oz bottle of beer (355 mL), one 5 oz glass of wine (148 mL), or one 1? oz glass of hard liquor (44 mL). ?Lifestyle ?Brush your teeth every morning and night with fluoride toothpaste. Floss one time each day. ?Exercise for at least 30 minutes 5 or more days each week. ?Do not use any products that contain nicotine or tobacco. These products include cigarettes, chewing tobacco, and vaping devices, such as e-cigarettes. If you need help quitting, ask your health care provider. ?Do not use drugs. ?If you are sexually active, practice safe sex. Use a condom or other form of protection in order to prevent STIs. ?Take aspirin only as told by your health care provider. Make sure that you understand how much to take and what form to take. Work with your health care provider to find out whether it is safe and beneficial for you to take aspirin daily. ?Ask your health care provider if you need to take a cholesterol-lowering medicine (statin). ?Find healthy ways to manage stress, such as: ?Meditation, yoga, or listening to music. ?Journaling. ?Talking to a trusted person. ?Spending time with friends and family. ?Minimize exposure to UV radiation to reduce your risk of skin cancer. ?Safety ?Always wear your seat belt while driving or riding in a vehicle. ?Do not drive: ?If you have been drinking alcohol. Do not ride with someone who has been drinking. ?When you are tired or distracted. ?While texting. ?If you have been using any mind-altering  substances or drugs. ?Wear a helmet and other protective equipment during sports activities. ?If you have firearms in your house, make sure you follow all gun safety procedures. ?What's next? ?Visit your health care provider once a year for an annual wellness visit. ?Ask your health care provider how often you should have your eyes and teeth checked. ?Stay up to date on all vaccines. ?This information is not intended to replace advice given to you by your health care provider. Make sure you discuss any questions you have with your health care provider. ?Document Revised: 12/24/2020 Document Reviewed: 12/24/2020 ?Elsevier Patient Education ? [employer]. ? ?Fall Prevention in the Home, Adult ?Falls can cause injuries and can happen to people of all ages. There are many things you can do to make your home safe and to help prevent falls. Ask for help when making these changes. ?What actions can I take to prevent falls? ?General Instructions ?Use good lighting in all rooms. Replace any light bulbs that burn out. ?Turn on the lights in dark areas. Use night-lights. ?Keep items that you use often in easy-to-reach places. Lower the shelves around your home if needed. ?Set up your furniture so you have a clear path.  Avoid moving your furniture around. ?Do not have throw rugs or other things on the floor that can make you trip. ?Avoid walking on wet floors. ?If any of your floors are uneven, fix them. ?Add color or contrast paint or tape to clearly mark and help you see: ?Grab bars or handrails. ?First and last steps of staircases. ?Where the edge of each step is. ?If you use a stepladder: ?Make sure that it is fully opened. Do not climb a closed stepladder. ?Make sure the sides of the stepladder are locked in place. ?Ask someone to hold the stepladder while you use it. ?Know where your pets are when moving through your home. ?What can I do in the bathroom? ?  ?Keep the floor dry. Clean up any water on the floor right  away. ?Remove soap buildup in the tub or shower. ?Use nonskid mats or decals on the floor of the tub or shower. ?Attach bath mats securely with double-sided, nonslip rug tape. ?If you need to sit down in the shower, use a pl

## 2021-10-13 NOTE — Progress Notes (Signed)
I have reviewed this visit and agree with the documentation.   

## 2021-10-23 ENCOUNTER — Other Ambulatory Visit: Payer: Self-pay

## 2021-10-23 MED ORDER — METOPROLOL SUCCINATE ER 25 MG PO TB24: 25.0000 mg | ORAL_TABLET | Freq: Two times a day (BID) | ORAL | 3 refills | Status: DC

## 2021-10-24 ENCOUNTER — Other Ambulatory Visit: Payer: Self-pay | Admitting: Family Medicine

## 2021-10-28 ENCOUNTER — Other Ambulatory Visit: Payer: Self-pay

## 2021-10-28 MED ORDER — LISINOPRIL 20 MG PO TABS: 20.0000 mg | ORAL_TABLET | Freq: Every day | ORAL | 3 refills | Status: DC

## 2021-11-01 ENCOUNTER — Other Ambulatory Visit: Payer: Self-pay | Admitting: Family Medicine

## 2021-11-06 ENCOUNTER — Other Ambulatory Visit: Payer: Self-pay | Admitting: Family Medicine

## 2021-11-06 DIAGNOSIS — I10 Essential (primary) hypertension: Secondary | ICD-10-CM

## 2021-11-09 NOTE — Telephone Encounter (Signed)
Called patient regarding potassium supplement refill. Will place an order for BMP to be completed in the next 1-2 days to check and make sure potassium is appropriate as last check was in 2022. Patient is aware and will come in to get the lab completed. ? ? ?Guadalupe Kerekes, DO,  ?

## 2021-11-10 ENCOUNTER — Other Ambulatory Visit: Payer: Medicare Other

## 2021-11-10 DIAGNOSIS — I10 Essential (primary) hypertension: Secondary | ICD-10-CM | POA: Diagnosis not present

## 2021-11-11 LAB — BASIC METABOLIC PANEL
BUN/Creatinine Ratio: 11 — ABNORMAL LOW (ref 12–28)
BUN: 13 mg/dL (ref 8–27)
CO2: 24 mmol/L (ref 20–29)
Calcium: 9.3 mg/dL (ref 8.7–10.3)
Chloride: 104 mmol/L (ref 96–106)
Creatinine, Ser: 1.19 mg/dL — ABNORMAL HIGH (ref 0.57–1.00)
Glucose: 101 mg/dL — ABNORMAL HIGH (ref 70–99)
Potassium: 5.2 mmol/L (ref 3.5–5.2)
Sodium: 141 mmol/L (ref 134–144)
eGFR: 49 mL/min/{1.73_m2} — ABNORMAL LOW (ref >59)

## 2021-12-04 ENCOUNTER — Ambulatory Visit (INDEPENDENT_AMBULATORY_CARE_PROVIDER_SITE_OTHER): Payer: Medicare Other | Admitting: Cardiology

## 2021-12-04 VITALS — BP 136/82 | HR 65 | Ht 69.0 in | Wt 210.9 lb

## 2021-12-04 DIAGNOSIS — R0609 Other forms of dyspnea: Secondary | ICD-10-CM | POA: Diagnosis not present

## 2021-12-04 DIAGNOSIS — Z7189 Other specified counseling: Secondary | ICD-10-CM | POA: Diagnosis not present

## 2021-12-04 DIAGNOSIS — E782 Mixed hyperlipidemia: Secondary | ICD-10-CM | POA: Diagnosis not present

## 2021-12-04 DIAGNOSIS — I10 Essential (primary) hypertension: Secondary | ICD-10-CM

## 2021-12-04 DIAGNOSIS — F17200 Nicotine dependence, unspecified, uncomplicated: Secondary | ICD-10-CM | POA: Diagnosis not present

## 2021-12-04 DIAGNOSIS — I2510 Atherosclerotic heart disease of native coronary artery without angina pectoris: Secondary | ICD-10-CM

## 2021-12-04 MED ORDER — HYDRALAZINE HCL 50 MG PO TABS: 50.0000 mg | ORAL_TABLET | Freq: Two times a day (BID) | ORAL | 3 refills | Status: DC

## 2021-12-04 MED ORDER — LISINOPRIL 40 MG PO TABS: 40.0000 mg | ORAL_TABLET | Freq: Every day | ORAL | 3 refills | Status: DC

## 2021-12-04 NOTE — Patient Instructions (Signed)
Medication Instructions:  Increase Hydralazine to 50 mg twice a day   *If you need a refill on your cardiac medications before your next appointment, please call your pharmacy*   Lab Work: None ordered today   Testing/Procedures: None ordered today   Follow-Up: At [facility], you and your health needs are our priority.  As part of our continuing mission to provide you with exceptional heart care, we have created designated Provider Care Teams.  These Care Teams include your primary Cardiologist (physician) and Advanced Practice Providers (APPs -  Physician Assistants and Nurse Practitioners) who all work together to provide you with the care you need, when you need it.  We recommend signing up for the patient portal called "MyChart".  Sign up information is provided on this After Visit Summary.  MyChart is used to connect with patients for Virtual Visits (Telemedicine).  Patients are able to view lab/test results, encounter notes, upcoming appointments, etc.  Non-urgent messages can be sent to your provider as well.   To learn more about what you can do with MyChart, go to [email or web address].    Your next appointment:   1 year(s)  The format for your next appointment:   In Person  Provider:   Buford Dresser, MD{

## 2021-12-04 NOTE — Progress Notes (Signed)
Cardiology Office Note:    Date:  12/04/2021   ID:  Lindsey Payne, DOB September 15, 1950, MRN [id number]  PCP:  Rise Patience, DO  Cardiologist:  Buford Dresser, MD  Referring MD: Rise Patience, DO   CC: follow up  History of Present Illness:    Lindsey Payne is a 71 y.o. female with a hx of CAD, chronic systolic heart failure, mixed hyperlipidemia, hypertension, tobacco abuse who is seen for follow up today. I initially met her via telemedicine 01/11/19 (prior Dr. Jennye Moccasin. Wynonia Lawman).  Cardiac history: She reports 2 stents placed with Dr. Wynonia Lawman in 2007, though I cannot see this in his notes (he notes nonobstructive CAD). No cath since 2007. Prior EF noted at 35-45% per notes, though Dr. Thurman Coyer note from 2015 noted EF 45-50% on echo 07/16/2009. Cath 08/2005: normal LM, ostial LAD 30%, ostial RCA 40%.  At her last appointment, she reported episodes of chest and left arm pain with walking certain distances. She continued to smoke about 3 cigarettes per day.   Today: She is accompanied by her sister. Overall she is feeling fine with no new cardiovascular concerns.  At home her blood pressure averages in the 828'M systolic; checks after drinking coffee.  For exercise, she is walking her dog more frequently, from her home to the bottom of her street. Sometimes her walking is limited by back pain.  Regarding her diet, she has been doing well with eating grilled foods and salads.  Recently her 20 mg lisinopril was increased to twice daily. She remains compliant with her medications.  She is smoking about 3 cigarettes per day. She continues to work on quitting.  She denies any palpitations, chest pain, shortness of breath, or peripheral edema. No lightheadedness, headaches, syncope, orthopnea, or PND.   Past Medical History:  Diagnosis Date   Adenoma of large intestine    Anxiety    Arthritis    right hand    Asthma    Back pain    Cataract    CHF  (congestive heart failure) (HCC)    Coronary artery disease    Depression    Ganglion cyst    rt wrist   Heart failure with reduced ejection fraction ([place]) 08/15/2019   Hemorrhoids    Hyperlipidemia    Hypertension    Loss of teeth due to extraction 09/29/2016   Lung collapse 1984   left lung, no problems since then   Rectal bleeding 01/30/2013   Right shoulder pain 11/26/2020   Sickle cell anemia ([place])    trait only   Transfusion history    Tubular adenoma    Wears glasses     Past Surgical History:  Procedure Laterality Date   ABDOMINAL HYSTERECTOMY     has one ovary left   BACK SURGERY     3 disc removed   [place]   benign tubulovillous adenoma   COLONOSCOPY N/A 07/22/2017   Procedure: COLONOSCOPY;  Surgeon: Alphonsa Overall, MD;  Location: [facility];  Service: General;  Laterality: N/A;  NO ANESTHESIA PER DR. Lucia Gaskins   COLONOSCOPY  10/2020   MS-MAC-poor prep-TA=repeat in 3 months due to poor prep   CORONARY ANGIOPLASTY  2005   w 2 stents   GANGLION CYST EXCISION Right 10/07/2016   Procedure: Right wrist volar ganglion excision;  Surgeon: Iran Planas, MD;  Location: [facility];  Service: Orthopedics;  Laterality: Right;   Heart stints  Dr. Wynonia Lawman   LUNG SURGERY Left    for collasped lung- in the 1980s   POLYPECTOMY  10/2020   TA x 1   TONSILLECTOMY     TOTAL HIP ARTHROPLASTY Right 05/13/2015   Procedure: RIGHT TOTAL HIP ARTHROPLASTY ANTERIOR APPROACH;  Surgeon: Mcarthur Rossetti, MD;  Location: [facility];  Service: Orthopedics;  Laterality: Right;    Current Medications: Current Outpatient Medications on File Prior to Visit  Medication Sig   aspirin 81 MG EC tablet Take 1 tablet (81 mg total) by mouth daily.   furosemide (LASIX) 20 MG tablet TAKE 1 TABLET BY MOUTH TWICE  DAILY   hydrALAZINE (APRESOLINE) 50 MG tablet TAKE ONE-HALF TABLET BY  MOUTH TWICE DAILY   isosorbide dinitrate (ISORDIL) 20 MG tablet  TAKE 1 TABLET BY MOUTH  TWICE DAILY   lisinopril (ZESTRIL) 20 MG tablet Take 1 tablet (20 mg total) by mouth daily. (Patient taking differently: Take 40 mg by mouth daily.)   metoprolol succinate (TOPROL-XL) 25 MG 24 hr tablet Take 1 tablet (25 mg total) by mouth 2 (two) times daily.   nitroGLYCERIN (NITROSTAT) 0.4 MG SL tablet Place 1 tablet (0.4 mg total) under the tongue every 5 (five) minutes as needed.   oxymetazoline (AFRIN) 0.05 % nasal spray Place 1 spray into both nostrils 2 (two) times daily as needed for congestion.   PARoxetine (PAXIL) 40 MG tablet TAKE 1 TABLET BY MOUTH IN  THE MORNING   potassium chloride SA (KLOR-CON M) 20 MEQ tablet TAKE 1 TABLET BY MOUTH  TWICE DAILY   rosuvastatin (CRESTOR) 20 MG tablet TAKE 1 TABLET BY MOUTH  DAILY   spironolactone (ALDACTONE) 25 MG tablet TAKE 1 TABLET BY MOUTH  DAILY   No current facility-administered medications on file prior to visit.     Allergies:   Valium [diazepam]   Social History   Tobacco Use   Smoking status: Every Day    Packs/day: 0.25    Years: 50.00    Pack years: 12.50    Types: Cigarettes    Start date: 07/12/1958   Smokeless tobacco: Never   Tobacco comments:    smoking 0-3 cigs per day  Vaping Use   Vaping Use: Never used  Substance Use Topics   Alcohol use: Yes    Alcohol/week: 1.0 standard drink    Types: 1 Cans of beer per week   Drug use: No    Family History: family history includes Breast cancer in her mother; Cancer in her father and mother; Cancer (age of onset: 56) in her sister; Colon cancer (age of onset: 59) in her father; Colon polyps (age of onset: 7) in her father; Diabetes in her father; Heart disease in her father, maternal grandfather, and mother; Hypertension in her son; Stroke in her maternal grandmother. There is no history of Esophageal cancer, Stomach cancer, or Rectal cancer.  ROS:   Please see the history of present illness.  (+) Back pain Additional pertinent ROS otherwise  unremarkable.  EKGs/Labs/Other Studies Reviewed:    The following studies were reviewed today: Echo 10/09/20  1. Left ventricular ejection fraction, by estimation, is 60 to 65%. Left  ventricular ejection fraction by 3D volume is 63 %. The left ventricle has  normal function. The left ventricle has no regional wall motion  abnormalities. Left ventricular diastolic   parameters are consistent with Grade I diastolic dysfunction (impaired  relaxation).   2. Right ventricular systolic function is normal. The right ventricular  size is  normal. Tricuspid regurgitation signal is inadequate for assessing  PA pressure.   3. The mitral valve is grossly normal. No evidence of mitral valve  regurgitation. No evidence of mitral stenosis.   4. The aortic valve is tricuspid. Aortic valve regurgitation is not  visualized. No aortic stenosis is present.   5. The inferior vena cava is normal in size with greater than 50%  respiratory variability, suggesting right atrial pressure of 3 mmHg.   MPI 09/12/20 The left ventricular ejection fraction is mildly decreased (45-54%). Nuclear stress EF: 53%. There was no ST segment deviation noted during stress. This is a low risk study.   Low risk stress nuclear study with normal perfusion and borderline reduced left ventricular global systolic function.    EKG:  EKG is personally reviewed. 12/04/2021:  NSR at 65 bpm, PRWP and Q in III similar to prior 08/20/2020: NSR, abnormal R wave progression, small Q in III, aVF. This is similar to prior. 11/17/2020: EKG is not ordered today.  Recent Labs: 11/10/2021: BUN 13; Creatinine, Ser 1.19; Potassium 5.2; Sodium 141   Recent Lipid Panel    Component Value Date/Time   CHOL 140 11/26/2020 1134   TRIG 123 11/26/2020 1134   HDL 51 11/26/2020 1134   CHOLHDL 2.7 11/26/2020 1134   CHOLHDL 7.0 (H) 07/08/2016 0858   VLDL 55 (H) 07/08/2016 0858   LDLCALC 67 11/26/2020 1134   LDLDIRECT 74 02/21/2017 0915   LDLDIRECT 135  (H) 05/01/2012 0918    Physical Exam:    VS:  BP 136/82 (BP Location: Left Arm, Patient Position: Sitting, Cuff Size: Normal)   Pulse 65   Ht _0  (1.753 m)   Wt 210 lb 14.4 oz (95.7 kg)   BMI 31.14 kg/m     Wt Readings from Last 3 Encounters:  12/04/21 210 lb 14.4 oz (95.7 kg)  07/22/21 207 lb (93.9 kg)  01/28/21 207 lb (93.9 kg)    GEN: Well nourished, well developed in no acute distress HEENT: Normal, moist mucous membranes NECK: No JVD CARDIAC: regular rhythm, normal S1 and S2, no rubs or gallops. No murmur. VASCULAR: Radial and DP pulses 2+ bilaterally. No carotid bruits RESPIRATORY:  Clear to auscultation without rales, wheezing or rhonchi  ABDOMEN: Soft, non-tender, non-distended MUSCULOSKELETAL:  Ambulates independently SKIN: Warm and dry, no edema; small laceration on right shin NEUROLOGIC:  Alert and oriented x 3. No focal neuro deficits noted. PSYCHIATRIC:  Normal affect   ASSESSMENT:    1. Dyspnea on exertion   2. Essential hypertension   3. Mixed hyperlipidemia   4. TOBACCO ABUSE   5. Coronary artery disease involving native coronary artery of native heart without angina pectoris   6. Cardiac risk counseling   7. Counseling on health promotion and disease prevention     PLAN:    Dyspnea on exertion -Improved. working to improve activity level, now more limited by back pain -reviewed prior nuclear stress test and echo results, reassuring.    Hyperlipidemia On rosuvastatin, continue   Essential hypertension Slightly elevated today but typically well controlled -continue furosemide, hydralazine, isosorbide, lisinopril, metoprolol, spironolactone   CAD (coronary artery disease) Normal stress and echo as above.  -Continue aspirin and statin.  -Counseled on red flag signs that need immediate medical attention.   Dr. Thurman Coyer notes state prior nonobstructive CAD, though she thought she has had prior stents.   TOBACCO ABUSE At 3 cigarettes/day.  Working to quit completely, encouraged.   History of heart failure Reported prior  chronic systolic heart failure, but EF normal on most recent echo. Continue hydralazine, furosemide 20 mg daily, isordil, lisinopril, metoprolol succinate, spironolactone  Secondary prevention: -recommend heart healthy/Mediterranean diet, with whole grains, fruits, vegetable, fish, lean meats, nuts, and olive oil. Limit salt. -recommend moderate walking, 3-5 times/week for 30-50 minutes each session. Aim for at least 150 minutes.week. Goal should be pace of 3 miles/hours, or walking 1.5 miles in 30 minutes -recommend avoidance of tobacco products. Avoid excess alcohol.  Plan for follow up: 1 year.  Medication Adjustments/Labs and Tests Ordered: Current medicines are reviewed at length with the patient today.  Concerns regarding medicines are outlined above.  Orders Placed This Encounter  Procedures   EKG 12-Lead   Meds ordered this encounter  Medications   lisinopril (ZESTRIL) 40 MG tablet    Sig: Take 1 tablet (40 mg total) by mouth daily.    Dispense:  90 tablet    Refill:  3    Requesting 1 year supply   hydrALAZINE (APRESOLINE) 50 MG tablet    Sig: Take 1 tablet (50 mg total) by mouth 2 (two) times daily.    Dispense:  180 tablet    Refill:  3    Requesting 1 year supply    Patient Instructions  Medication Instructions:  Increase Hydralazine to 50 mg twice a day   *If you need a refill on your cardiac medications before your next appointment, please call your pharmacy*   Lab Work: None ordered today   Testing/Procedures: None ordered today   Follow-Up: At [facility], you and your health needs are our priority.  As part of our continuing mission to provide you with exceptional heart care, we have created designated Provider Care Teams.  These Care Teams include your primary Cardiologist (physician) and Advanced Practice Providers (APPs -  Physician Assistants and Nurse  Practitioners) who all work together to provide you with the care you need, when you need it.  We recommend signing up for the patient portal called "MyChart".  Sign up information is provided on this After Visit Summary.  MyChart is used to connect with patients for Virtual Visits (Telemedicine).  Patients are able to view lab/test results, encounter notes, upcoming appointments, etc.  Non-urgent messages can be sent to your provider as well.   To learn more about what you can do with MyChart, go to [email or web address].    Your next appointment:   1 year(s)  The format for your next appointment:   In Person  Provider:   Buford Dresser, MD{            I,Mathew Stumpf,acting as a scribe for Buford Dresser, MD.,have documented all relevant documentation on the behalf of Buford Dresser, MD,as directed by  Buford Dresser, MD while in the presence of Buford Dresser, MD.  I, Buford Dresser, MD, have reviewed all documentation for this visit. The documentation on 12/04/21 for the exam, diagnosis, procedures, and orders are all accurate and complete.  Signed, Buford Dresser, MD PhD, [facility] 12/04/2021  [place]

## 2021-12-15 ENCOUNTER — Encounter: Payer: Self-pay | Admitting: *Deleted

## 2021-12-15 ENCOUNTER — Encounter (HOSPITAL_BASED_OUTPATIENT_CLINIC_OR_DEPARTMENT_OTHER): Payer: Self-pay | Admitting: Cardiology

## 2022-01-17 ENCOUNTER — Other Ambulatory Visit: Payer: Self-pay | Admitting: Family Medicine

## 2022-02-04 ENCOUNTER — Other Ambulatory Visit: Payer: Self-pay | Admitting: Family Medicine

## 2022-03-10 ENCOUNTER — Encounter: Payer: Self-pay | Admitting: Family Medicine

## 2022-03-10 ENCOUNTER — Ambulatory Visit (INDEPENDENT_AMBULATORY_CARE_PROVIDER_SITE_OTHER): Payer: Medicare Other | Admitting: Family Medicine

## 2022-03-10 VITALS — BP 132/72 | HR 66 | Ht 69.0 in | Wt 208.4 lb

## 2022-03-10 DIAGNOSIS — F17200 Nicotine dependence, unspecified, uncomplicated: Secondary | ICD-10-CM

## 2022-03-10 DIAGNOSIS — Z23 Encounter for immunization: Secondary | ICD-10-CM | POA: Diagnosis not present

## 2022-03-10 DIAGNOSIS — I10 Essential (primary) hypertension: Secondary | ICD-10-CM | POA: Diagnosis not present

## 2022-03-10 DIAGNOSIS — R7309 Other abnormal glucose: Secondary | ICD-10-CM | POA: Diagnosis not present

## 2022-03-10 DIAGNOSIS — R7303 Prediabetes: Secondary | ICD-10-CM | POA: Insufficient documentation

## 2022-03-10 HISTORY — DX: Prediabetes: R73.03

## 2022-03-10 NOTE — Assessment & Plan Note (Signed)
Stable, initially elevated in office but was 132/72 on repeat.  - Continue Hydralazine '50mg'$  BID - Isosorbide dinitrate '20mg'$  daily  - Lisinopril '40mg'$  daily - Metoprolol '25mg'$  BID - Spironolactone '25mg'$  daily - Lasix '20mg'$  daily

## 2022-03-10 NOTE — Assessment & Plan Note (Signed)
Patient continues to express wanting to stop smoking. Currently smoking 3 cig/day. Reports has previously tried multiple medications and routes of treatment without success. Patient not interested in Chantix, lozenges, gum, or Wellbutrin previously. Did express interest in nicotine nasal spray. - Discussed following with Dr. Valentina Lucks

## 2022-03-10 NOTE — Assessment & Plan Note (Signed)
Insurance nursing visit with reported A1c of 6.4 during their visit. They collected other labs as well, I am awaiting their results. Reassuringly patient is on kidney protectant medications and statin.  - Repeat A1c in 1 month to confirm

## 2022-03-10 NOTE — Progress Notes (Signed)
    SUBJECTIVE:   CHIEF COMPLAINT / HPI:   HTN: - Medications: hydral '50mg'$  bid, isosorbide dinitrate '20mg'$  daily, lisinopril '40mg'$  daily, metoprolol '25mg'$  bid, spironolactone '25mg'$  daily, lasix '20mg'$  daily  - Compliance: GoodR - Checking BP at home: 130s/70-80s - Denies any SOB, CP, vision changes, LE edema, medication SEs, or symptoms of hypotension  Wants prevnar shot  Had elevated A1c of 6.4 per the home health nurse visit  PERTINENT  PMH / PSH: Reviewed  OBJECTIVE:   BP 132/72   Pulse 66   Ht '5\' 9"'$  (1.753 m)   Wt 208 lb 6.4 oz (94.5 kg)   SpO2 100%   BMI 30.78 kg/m   General: NAD, well-appearing, well-nourished Respiratory: No respiratory distress, breathing comfortably, able to speak in full sentences Skin: warm and dry, no rashes noted on exposed skin Psych: Appropriate affect and mood  ASSESSMENT/PLAN:   Essential hypertension Stable, initially elevated in office but was 132/72 on repeat.  - Continue Hydralazine '50mg'$  BID - Isosorbide dinitrate '20mg'$  daily  - Lisinopril '40mg'$  daily - Metoprolol '25mg'$  BID - Spironolactone '25mg'$  daily - Lasix '20mg'$  daily  TOBACCO ABUSE Patient continues to express wanting to stop smoking. Currently smoking 3 cig/day. Reports has previously tried multiple medications and routes of treatment without success. Patient not interested in Chantix, lozenges, gum, or Wellbutrin previously. Did express interest in nicotine nasal spray. - Discussed following with Dr. Valentina Lucks   Elevated hemoglobin A1c Insurance nursing visit with reported A1c of 6.4 during their visit. They collected other labs as well, I am awaiting their results. Reassuringly patient is on kidney protectant medications and statin.  - Repeat A1c in 1 month to confirm   Healthcare maintenance Prevnar vaccine administered per patient preference  Rise Patience, [place]

## 2022-03-10 NOTE — Patient Instructions (Signed)
For the tobacco cessation products, actually wanted to follow-up with our pharmacist Dr. Valentina Lucks, he is much more well versed especially in the medication such as the nasal spray.  I want you to keep a close eye on your blood pressures, recommend taking them at least once per day for the next couple of weeks.  If your numbers are persistently > 140/90, then let me know we may need to figure out medication changes.  Lets follow-up in 1 month to follow-up the labs from your nurse visit.

## 2022-03-18 ENCOUNTER — Ambulatory Visit: Payer: Medicare Other | Admitting: Pharmacist

## 2022-04-06 ENCOUNTER — Encounter: Payer: Self-pay | Admitting: Family Medicine

## 2022-04-06 ENCOUNTER — Ambulatory Visit (INDEPENDENT_AMBULATORY_CARE_PROVIDER_SITE_OTHER): Payer: Medicare Other | Admitting: Family Medicine

## 2022-04-06 VITALS — BP 130/70 | HR 66 | Ht 69.0 in | Wt 208.4 lb

## 2022-04-06 DIAGNOSIS — F17200 Nicotine dependence, unspecified, uncomplicated: Secondary | ICD-10-CM | POA: Diagnosis not present

## 2022-04-06 DIAGNOSIS — R7303 Prediabetes: Secondary | ICD-10-CM | POA: Diagnosis not present

## 2022-04-06 LAB — POCT GLYCOSYLATED HEMOGLOBIN (HGB A1C): Hemoglobin A1C: 5.8 % — AB (ref 4.0–5.6)

## 2022-04-06 NOTE — Patient Instructions (Addendum)
The A1c today was reassuringly lower than the last one at 5.8.  Still in the prediabetic range, but no need for medications anything at this time.

## 2022-04-06 NOTE — Progress Notes (Signed)
    SUBJECTIVE:   CHIEF COMPLAINT / HPI:   Elevated A1c - Previously 6.4 on check from insurance nurse at house - No hyperglycemia symptoms present - no other concerns  Smoking - 3 cigarettes per day - Slowly working to cut down  PERTINENT  PMH / [place]: Reviewed  OBJECTIVE:   BP 130/70   Pulse 66   Ht '5\' 9"'$  (1.753 m)   Wt 208 lb 6.4 oz (94.5 kg)   SpO2 99%   BMI 30.78 kg/m   General: NAD, well-appearing, well-nourished Respiratory: No respiratory distress, breathing comfortably, able to speak in full sentences Skin: warm and dry, no rashes noted on exposed skin Psych: Appropriate affect and mood  ASSESSMENT/PLAN:   Prediabetes A1c previously elevated to 6.4 at home check per nursing.  Currently today is 5.8 and stable. - Repeat A1c in 6 months - Monitor for signs of hyperglycemia  TOBACCO ABUSE Still smoking 3 cigarettes/day.  Patient trying to discontinue on her own. - Still encouraging follow-up with Dr. Edison Simon, [place]

## 2022-04-06 NOTE — Assessment & Plan Note (Signed)
Still smoking 3 cigarettes/day.  Patient trying to discontinue on her own. - Still encouraging follow-up with Dr. Valentina Lucks

## 2022-04-17 DIAGNOSIS — H353132 Nonexudative age-related macular degeneration, bilateral, intermediate dry stage: Secondary | ICD-10-CM | POA: Diagnosis not present

## 2022-07-23 ENCOUNTER — Other Ambulatory Visit: Payer: Self-pay | Admitting: Family Medicine

## 2022-07-23 DIAGNOSIS — Z1231 Encounter for screening mammogram for malignant neoplasm of breast: Secondary | ICD-10-CM

## 2022-08-13 ENCOUNTER — Other Ambulatory Visit: Payer: Self-pay | Admitting: Family Medicine

## 2022-08-13 ENCOUNTER — Other Ambulatory Visit (HOSPITAL_BASED_OUTPATIENT_CLINIC_OR_DEPARTMENT_OTHER): Payer: Self-pay | Admitting: Cardiology

## 2022-08-13 DIAGNOSIS — I10 Essential (primary) hypertension: Secondary | ICD-10-CM

## 2022-08-13 NOTE — Telephone Encounter (Signed)
Rx request sent to pharmacy.  

## 2022-09-06 ENCOUNTER — Other Ambulatory Visit (HOSPITAL_BASED_OUTPATIENT_CLINIC_OR_DEPARTMENT_OTHER): Payer: Self-pay | Admitting: Cardiology

## 2022-09-06 DIAGNOSIS — I10 Essential (primary) hypertension: Secondary | ICD-10-CM

## 2022-09-07 NOTE — Telephone Encounter (Signed)
Rx request sent to pharmacy.  

## 2022-09-15 ENCOUNTER — Ambulatory Visit
Admission: RE | Admit: 2022-09-15 | Discharge: 2022-09-15 | Disposition: A | Discharge status: Home / Self Care | Payer: 59 | Source: Ambulatory Visit | Attending: Family Medicine | Admitting: Family Medicine

## 2022-09-15 DIAGNOSIS — Z1231 Encounter for screening mammogram for malignant neoplasm of breast: Secondary | ICD-10-CM

## 2022-09-23 ENCOUNTER — Ambulatory Visit: Payer: 59 | Admitting: Family Medicine

## 2022-09-27 ENCOUNTER — Encounter: Payer: Self-pay | Admitting: Family Medicine

## 2022-09-27 ENCOUNTER — Ambulatory Visit (INDEPENDENT_AMBULATORY_CARE_PROVIDER_SITE_OTHER): Payer: 59 | Admitting: Family Medicine

## 2022-09-27 VITALS — BP 154/83 | HR 71 | Ht 69.0 in | Wt 207.4 lb

## 2022-09-27 DIAGNOSIS — I10 Essential (primary) hypertension: Secondary | ICD-10-CM

## 2022-09-27 DIAGNOSIS — R7303 Prediabetes: Secondary | ICD-10-CM

## 2022-09-27 NOTE — Patient Instructions (Addendum)
It was nice seeing you today!  Keep working on cutting back on smoking.  Keep checking your blood pressure at home and try to bring your blood pressure log next time.  Let's plan to check your A1c next time.  I recommend that you get your Tdap vaccine at your pharmacy.  Stay well, Zola Button, MD [place] [phone]  --  Make sure to check out at the front desk before you leave today.  Please arrive at least 15 minutes prior to your scheduled appointments.  If you had blood work today, I will send you a MyChart message or a letter if results are normal. Otherwise, I will give you a call.  If you had a referral placed, they will call you to set up an appointment. Please give [place] a call if you don't hear back in the next 2 weeks.  If you need additional refills before your next appointment, please call your pharmacy first.

## 2022-09-27 NOTE — Assessment & Plan Note (Signed)
Discussed prior elevated A1c reading with patient, we opted to plan to repeat this at next checkup 6 months from now.

## 2022-09-27 NOTE — Progress Notes (Signed)
    SUBJECTIVE:   CHIEF COMPLAINT / HPI:  Chief Complaint  Patient presents with   Check up    No concerns today. She is still smoking 3 cigarettes a day.  She has tried nicotine replacement.  Also was prescribed Chantix in the past but never started due to fear of side effects.  Does not want to start any medications today.  Reports home blood pressure readings 130s over 70s.  Did not bring log today. Reports good adherence to her medications. She will be seeing her cardiologist sometime in May.  PERTINENT  PMH / PSH: prediabetes, tobacco use, CAD, HTN, HLD  Patient Care Team: Rise Patience, DO as PCP - General (Family Medicine) Buford Dresser, MD as PCP - Cardiology (Cardiology) Ladene Artist, MD as Consulting Physician (Gastroenterology) Criselda Peaches, DPM as Consulting Physician (Podiatry) Benna Dunks, Judeth Cornfield, DMD (Dentistry)   OBJECTIVE:   BP (!) 154/83   Pulse 71   Ht 5\' 9"  (1.753 m)   Wt 207 lb 6.4 oz (94.1 kg)   SpO2 100%   BMI 30.63 kg/m   Physical Exam Constitutional:      General: She is not in acute distress. Cardiovascular:     Rate and Rhythm: Normal rate and regular rhythm.  Pulmonary:     Effort: Pulmonary effort is normal. No respiratory distress.     Breath sounds: Normal breath sounds.  Musculoskeletal:     Cervical back: Neck supple.  Neurological:     Mental Status: She is alert.         09/27/2022    9:50 AM  Depression screen PHQ 2/9  Decreased Interest 0  Down, Depressed, Hopeless 0  PHQ - 2 Score 0  Altered sleeping 0  Tired, decreased energy 0  Change in appetite 0  Feeling bad or failure about yourself  0  Trouble concentrating 0  Moving slowly or fidgety/restless 0  Suicidal thoughts 0  PHQ-9 Score 0  Difficult doing work/chores Not difficult at all     {Show previous vital signs (optional):23777}    ASSESSMENT/PLAN:   Prediabetes Discussed prior elevated A1c reading with patient, we opted to plan  to repeat this at next checkup 6 months from now.  Essential hypertension Elevated in office but normal ambulatory BP readings.  No changes to current medications.  Advised to bring BP log at next visit.    Return in about 6 months (around 03/30/2023) for physical.   Zola Button, MD [place]

## 2022-09-27 NOTE — Assessment & Plan Note (Addendum)
Elevated in office but normal ambulatory BP readings.  No changes to current medications.  Advised to bring BP log at next visit.

## 2022-10-13 DIAGNOSIS — H524 Presbyopia: Secondary | ICD-10-CM | POA: Diagnosis not present

## 2022-10-13 DIAGNOSIS — H52203 Unspecified astigmatism, bilateral: Secondary | ICD-10-CM | POA: Diagnosis not present

## 2022-10-13 DIAGNOSIS — H25042 Posterior subcapsular polar age-related cataract, left eye: Secondary | ICD-10-CM | POA: Diagnosis not present

## 2022-10-13 DIAGNOSIS — H353132 Nonexudative age-related macular degeneration, bilateral, intermediate dry stage: Secondary | ICD-10-CM | POA: Diagnosis not present

## 2022-10-13 DIAGNOSIS — H35033 Hypertensive retinopathy, bilateral: Secondary | ICD-10-CM | POA: Diagnosis not present

## 2022-10-13 DIAGNOSIS — H2512 Age-related nuclear cataract, left eye: Secondary | ICD-10-CM | POA: Diagnosis not present

## 2022-10-13 DIAGNOSIS — H25012 Cortical age-related cataract, left eye: Secondary | ICD-10-CM | POA: Diagnosis not present

## 2022-10-27 LAB — HM DIABETES EYE EXAM

## 2022-10-28 DIAGNOSIS — H25812 Combined forms of age-related cataract, left eye: Secondary | ICD-10-CM | POA: Diagnosis not present

## 2022-10-28 DIAGNOSIS — H25012 Cortical age-related cataract, left eye: Secondary | ICD-10-CM | POA: Diagnosis not present

## 2022-10-28 DIAGNOSIS — H269 Unspecified cataract: Secondary | ICD-10-CM | POA: Diagnosis not present

## 2022-10-28 DIAGNOSIS — H2512 Age-related nuclear cataract, left eye: Secondary | ICD-10-CM | POA: Diagnosis not present

## 2022-10-28 DIAGNOSIS — H25042 Posterior subcapsular polar age-related cataract, left eye: Secondary | ICD-10-CM | POA: Diagnosis not present

## 2022-11-05 ENCOUNTER — Telehealth: Payer: Self-pay | Admitting: Family Medicine

## 2022-11-05 NOTE — Telephone Encounter (Signed)
Called patient to schedule Medicare Annual Wellness Visit (AWV). Left message for patient to call back and schedule Medicare Annual Wellness Visit (AWV).  Last date of AWV: 09/29/2021   Please schedule an AWVS appointment at any time with [facility] ANNUAL WELLNESS VISIT.  If any questions, please contact me at [phone].   Thank you,  Morse Brueggemann  Ambulatory Clinic Support [facility] Direct dial  [phone]   

## 2022-11-15 ENCOUNTER — Other Ambulatory Visit (HOSPITAL_BASED_OUTPATIENT_CLINIC_OR_DEPARTMENT_OTHER): Payer: Self-pay | Admitting: Cardiology

## 2022-11-15 ENCOUNTER — Other Ambulatory Visit: Payer: Self-pay | Admitting: Family Medicine

## 2022-11-15 DIAGNOSIS — I10 Essential (primary) hypertension: Secondary | ICD-10-CM

## 2022-11-16 NOTE — Telephone Encounter (Signed)
Rx request sent to pharmacy.  

## 2022-11-26 DIAGNOSIS — H353132 Nonexudative age-related macular degeneration, bilateral, intermediate dry stage: Secondary | ICD-10-CM | POA: Diagnosis not present

## 2022-12-16 ENCOUNTER — Other Ambulatory Visit: Payer: Self-pay | Admitting: Family Medicine

## 2023-01-25 ENCOUNTER — Other Ambulatory Visit (HOSPITAL_BASED_OUTPATIENT_CLINIC_OR_DEPARTMENT_OTHER): Payer: Self-pay | Admitting: Cardiology

## 2023-01-25 DIAGNOSIS — I10 Essential (primary) hypertension: Secondary | ICD-10-CM

## 2023-02-06 NOTE — Patient Instructions (Signed)
Lindsey Payne , Thank you for taking time to come for your Medicare Wellness Visit. I appreciate your ongoing commitment to your health goals. Please review the following plan we discussed and let me know if I can assist you in the future.   Referrals/Orders/Follow-Ups/Clinician Recommendations: Aim for 30 minutes of exercise or brisk walking, 6-8 glasses of water, and 5 servings of fruits and vegetables each day.  This is a list of the screening recommended for you and due dates:  Health Maintenance  Topic Date Due   COVID-19 Vaccine (6 - 2023-24 season) 03/12/2022   DTaP/Tdap/Td vaccine (3 - Td or Tdap) 05/01/2022   Flu Shot  02/10/2023   Medicare Annual Wellness Visit  02/07/2024   Mammogram  09/14/2024   Colon Cancer Screening  01/29/2028   Pneumonia Vaccine  Completed   DEXA scan (bone density measurement)  Completed   Hepatitis C Screening  Completed   Zoster (Shingles) Vaccine  Completed   HPV Vaccine  Aged Out    Advanced directives: (ACP Link)Information on Advanced Care Planning can be found at [facility] [place] Advance Health Care Directives Advance Health Care Directives ([email or web address]   Next Medicare Annual Wellness Visit scheduled for next year: Yes  Preventive Care 65 Years and Older, Female Preventive care refers to lifestyle choices and visits with your health care provider that can promote health and wellness. What does preventive care include? A yearly physical exam. This is also called an annual well check. Dental exams once or twice a year. Routine eye exams. Ask your health care provider how often you should have your eyes checked. Personal lifestyle choices, including: Daily care of your teeth and gums. Regular physical activity. Eating a healthy diet. Avoiding tobacco and drug use. Limiting alcohol use. Practicing safe sex. Taking low-dose aspirin every day. Taking vitamin and mineral supplements as recommended by your health care  provider. What happens during an annual well check? The services and screenings done by your health care provider during your annual well check will depend on your age, overall health, lifestyle risk factors, and family history of disease. Counseling  Your health care provider may ask you questions about your: Alcohol use. Tobacco use. Drug use. Emotional well-being. Home and relationship well-being. Sexual activity. Eating habits. History of falls. Memory and ability to understand (cognition). Work and work Astronomer. Reproductive health. Screening  You may have the following tests or measurements: Height, weight, and BMI. Blood pressure. Lipid and cholesterol levels. These may be checked every 5 years, or more frequently if you are over 65 years old. Skin check. Lung cancer screening. You may have this screening every year starting at age 34 if you have a 30-pack-year history of smoking and currently smoke or have quit within the past 15 years. Fecal occult blood test (FOBT) of the stool. You may have this test every year starting at age 41. Flexible sigmoidoscopy or colonoscopy. You may have a sigmoidoscopy every 5 years or a colonoscopy every 10 years starting at age 38. Hepatitis C blood test. Hepatitis B blood test. Sexually transmitted disease (STD) testing. Diabetes screening. This is done by checking your blood sugar (glucose) after you have not eaten for a while (fasting). You may have this done every 1-3 years. Bone density scan. This is done to screen for osteoporosis. You may have this done starting at age 15. Mammogram. This may be done every 1-2 years. Talk to your health care provider about how often you should have regular  mammograms. Talk with your health care provider about your test results, treatment options, and if necessary, the need for more tests. Vaccines  Your health care provider may recommend certain vaccines, such as: Influenza vaccine. This is  recommended every year. Tetanus, diphtheria, and acellular pertussis (Tdap, Td) vaccine. You may need a Td booster every 10 years. Zoster vaccine. You may need this after age 35. Pneumococcal 13-valent conjugate (PCV13) vaccine. One dose is recommended after age 59. Pneumococcal polysaccharide (PPSV23) vaccine. One dose is recommended after age 17. Talk to your health care provider about which screenings and vaccines you need and how often you need them. This information is not intended to replace advice given to you by your health care provider. Make sure you discuss any questions you have with your health care provider. Document Released: 07/25/2015 Document Revised: 03/17/2016 Document Reviewed: 04/29/2015 Elsevier Interactive Patient Education  2017 [employer].  Fall Prevention in the Home Falls can cause injuries. They can happen to people of all ages. There are many things you can do to make your home safe and to help prevent falls. What can I do on the outside of my home? Regularly fix the edges of walkways and driveways and fix any cracks. Remove anything that might make you trip as you walk through a door, such as a raised step or threshold. Trim any bushes or trees on the path to your home. Use bright outdoor lighting. Clear any walking paths of anything that might make someone trip, such as rocks or tools. Regularly check to see if handrails are loose or broken. Make sure that both sides of any steps have handrails. Any raised decks and porches should have guardrails on the edges. Have any leaves, snow, or ice cleared regularly. Use sand or salt on walking paths during winter. Clean up any spills in your garage right away. This includes oil or grease spills. What can I do in the bathroom? Use night lights. Install grab bars by the toilet and in the tub and shower. Do not use towel bars as grab bars. Use non-skid mats or decals in the tub or shower. If you need to sit down in  the shower, use a plastic, non-slip stool. Keep the floor dry. Clean up any water that spills on the floor as soon as it happens. Remove soap buildup in the tub or shower regularly. Attach bath mats securely with double-sided non-slip rug tape. Do not have throw rugs and other things on the floor that can make you trip. What can I do in the bedroom? Use night lights. Make sure that you have a light by your bed that is easy to reach. Do not use any sheets or blankets that are too big for your bed. They should not hang down onto the floor. Have a firm chair that has side arms. You can use this for support while you get dressed. Do not have throw rugs and other things on the floor that can make you trip. What can I do in the kitchen? Clean up any spills right away. Avoid walking on wet floors. Keep items that you use a lot in easy-to-reach places. If you need to reach something above you, use a strong step stool that has a grab bar. Keep electrical cords out of the way. Do not use floor polish or wax that makes floors slippery. If you must use wax, use non-skid floor wax. Do not have throw rugs and other things on the floor that can  make you trip. What can I do with my stairs? Do not leave any items on the stairs. Make sure that there are handrails on both sides of the stairs and use them. Fix handrails that are broken or loose. Make sure that handrails are as long as the stairways. Check any carpeting to make sure that it is firmly attached to the stairs. Fix any carpet that is loose or worn. Avoid having throw rugs at the top or bottom of the stairs. If you do have throw rugs, attach them to the floor with carpet tape. Make sure that you have a light switch at the top of the stairs and the bottom of the stairs. If you do not have them, ask someone to add them for you. What else can I do to help prevent falls? Wear shoes that: Do not have high heels. Have rubber bottoms. Are comfortable  and fit you well. Are closed at the toe. Do not wear sandals. If you use a stepladder: Make sure that it is fully opened. Do not climb a closed stepladder. Make sure that both sides of the stepladder are locked into place. Ask someone to hold it for you, if possible. Clearly mark and make sure that you can see: Any grab bars or handrails. First and last steps. Where the edge of each step is. Use tools that help you move around (mobility aids) if they are needed. These include: Canes. Walkers. Scooters. Crutches. Turn on the lights when you go into a dark area. Replace any light bulbs as soon as they burn out. Set up your furniture so you have a clear path. Avoid moving your furniture around. If any of your floors are uneven, fix them. If there are any pets around you, be aware of where they are. Review your medicines with your doctor. Some medicines can make you feel dizzy. This can increase your chance of falling. Ask your doctor what other things that you can do to help prevent falls. This information is not intended to replace advice given to you by your health care provider. Make sure you discuss any questions you have with your health care provider. Document Released: 04/24/2009 Document Revised: 12/04/2015 Document Reviewed: 08/02/2014 Elsevier Interactive Patient Education  2017 [employer].

## 2023-02-06 NOTE — Progress Notes (Unsigned)
Subjective:   Kimley Bittle is a 72 y.o. female who presents for Medicare Annual (Subsequent) preventive examination.  Visit Complete: {[phone]}  Patient Medicare AWV questionnaire was completed by the patient on ***; I have confirmed that all information answered by patient is correct and no changes since this date.  Review of Systems    ***   Vital Signs: {telehealth vitals:30100}      Objective:    There were no vitals filed for this visit. There is no height or weight on file to calculate BMI.     09/27/2022    9:51 AM 04/06/2022    2:59 PM 03/10/2022   10:10 AM 09/29/2021    1:48 PM 07/22/2021    9:50 AM 11/26/2020   10:37 AM 04/07/2020    9:42 AM  Advanced Directives  Does Patient Have a Medical Advance Directive? No No No No No No No  Would patient like information on creating a medical advance directive? No - Patient declined No - Patient declined No - Patient declined Yes (MAU/Ambulatory/Procedural Areas - Information given) No - Patient declined No - Patient declined No - Patient declined    Current Medications (verified) Outpatient Encounter Medications as of 02/07/2023  Medication Sig   aspirin 81 MG EC tablet Take 1 tablet (81 mg total) by mouth daily.   furosemide (LASIX) 20 MG tablet TAKE 1 TABLET BY MOUTH TWICE  DAILY   hydrALAZINE (APRESOLINE) 50 MG tablet Take 1 tablet (50 mg total) by mouth 2 (two) times daily. NEED APPOINTMENT   isosorbide dinitrate (ISORDIL) 20 MG tablet TAKE 1 TABLET BY MOUTH TWICE  DAILY   lisinopril (ZESTRIL) 40 MG tablet Take 1 tablet (40 mg total) by mouth daily. NEED APPOINTMENT   metoprolol succinate (TOPROL-XL) 25 MG 24 hr tablet TAKE 1 TABLET BY MOUTH TWICE  DAILY   nitroGLYCERIN (NITROSTAT) 0.4 MG SL tablet Place 1 tablet (0.4 mg total) under the tongue every 5 (five) minutes as needed.   oxymetazoline (AFRIN) 0.05 % nasal spray Place 1 spray into both nostrils 2 (two) times daily as needed for  congestion.   PARoxetine (PAXIL) 40 MG tablet TAKE 1 TABLET BY MOUTH IN THE  MORNING   potassium chloride SA (KLOR-CON M) 20 MEQ tablet TAKE 1 TABLET BY MOUTH TWICE  DAILY   rosuvastatin (CRESTOR) 20 MG tablet TAKE 1 TABLET BY MOUTH DAILY   spironolactone (ALDACTONE) 25 MG tablet TAKE 1 TABLET BY MOUTH DAILY   No facility-administered encounter medications on file as of 02/07/2023.    Allergies (verified) Valium [diazepam]   History: Past Medical History:  Diagnosis Date   Adenoma of large intestine    Anxiety    Arthritis    right hand    Asthma    Back pain    Cataract    CHF (congestive heart failure) (HCC)    Coronary artery disease    Depression    Ganglion cyst    rt wrist   Heart failure with reduced ejection fraction (HCC) 08/15/2019   Hemorrhoids    Hyperlipidemia    Hypertension    Loss of teeth due to extraction 09/29/2016   Lung collapse 1984   left lung, no problems since then   Rectal bleeding 01/30/2013   Right shoulder pain 11/26/2020   Sickle cell anemia (HCC)    trait only   Transfusion history    Tubular adenoma    Wears glasses    Past Surgical History:  Procedure Laterality Date  ABDOMINAL HYSTERECTOMY     has one ovary left   BACK SURGERY     3 disc removed   CESAREAN SECTION     1983   COLON SURGERY  1987   benign tubulovillous adenoma   COLONOSCOPY N/A 07/22/2017   Procedure: COLONOSCOPY;  Surgeon: Ovidio Kin, MD;  Location: [facility];  Service: General;  Laterality: N/A;  NO ANESTHESIA PER DR. Ezzard Standing   COLONOSCOPY  10/2020   MS-MAC-poor prep-TA=repeat in 3 months due to poor prep   CORONARY ANGIOPLASTY  2005   w 2 stents   GANGLION CYST EXCISION Right 10/07/2016   Procedure: Right wrist volar ganglion excision;  Surgeon: Bradly Bienenstock, MD;  Location: [facility];  Service: Orthopedics;  Laterality: Right;   Heart stints     Dr. Donnie Aho   LUNG SURGERY Left    for collasped lung- in the 1980s   POLYPECTOMY  10/2020    TA x 1   TONSILLECTOMY     TOTAL HIP ARTHROPLASTY Right 05/13/2015   Procedure: RIGHT TOTAL HIP ARTHROPLASTY ANTERIOR APPROACH;  Surgeon: Kathryne Hitch, MD;  Location: [facility] OR;  Service: Orthopedics;  Laterality: Right;   Family History  Problem Relation Age of Onset   Cancer Mother    Breast cancer Mother    Heart disease Mother    Cancer Father    Colon polyps Father 4   Colon cancer Father 77   Diabetes Father    Heart disease Father    Breast cancer Sister 19   Cancer Sister 68       Breast   Stroke Maternal Grandmother    Heart disease Maternal Grandfather    Hypertension Son    Esophageal cancer Neg Hx    Stomach cancer Neg Hx    Rectal cancer Neg Hx    Social History   Socioeconomic History   Marital status: Widowed    Spouse name: Not on file   Number of children: 1   Years of education: 82   Highest education level: High school graduate  Occupational History   Occupation: disabled    Comment: Designer, 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  Tobacco Use   Smoking status: Every Day    Current packs/day: 0.25    Average packs/day: 0.3 packs/day for 64.6 years (16.1 ttl pk-yrs)    Types: Cigarettes    Start date: 07/12/1958   Smokeless tobacco: Never   Tobacco comments:    smoking 0-3 cigs per day  Vaping Use   Vaping status: Never Used  Substance and Sexual Activity   Alcohol use: Yes    Alcohol/week: 1.0 standard drink of alcohol    Types: 1 Cans of beer per week   Drug use: No   Sexual activity: Not Currently    Birth control/protection: Surgical  Other Topics Concern   Not on file  Social History Narrative   Patient lives alone with her dog Chance.      Patient is very close with her Son [place]. Patients son takes care of her finances for her, and has recently paid off her home and bought her a car.       Enjoys spending time with her son and her dog Chance.    Social Determinants of Health   Financial Resource Strain: Low Risk  (09/29/2021)   Overall  Financial Resource Strain (CARDIA)    Difficulty of Paying Living Expenses: Not hard at all  Food Insecurity: No Food Insecurity (09/29/2021)   Hunger  Vital Sign    Worried About Programme researcher, broadcasting/film/video in the Last Year: Never true    Ran Out of Food in the Last Year: Never true  Transportation Needs: No Transportation Needs (09/29/2021)   PRAPARE - Administrator, Civil Service (Medical): No    Lack of Transportation (Non-Medical): No  Physical Activity: Inactive (09/29/2021)   Exercise Vital Sign    Days of Exercise per Week: 0 days    Minutes of Exercise per Session: 0 min  Stress: No Stress Concern Present (09/29/2021)   [employer] of Occupational Health - Occupational Stress Questionnaire    Feeling of Stress : Not at all  Social Connections: Moderately Integrated (09/29/2021)   Social Connection and Isolation Panel [NHANES]    Frequency of Communication with Friends and Family: More than three times a week    Frequency of Social Gatherings with Friends and Family: More than three times a week    Attends Religious Services: More than 4 times per year    Active Member of [employer] or Organizations: Yes    Attends Banker Meetings: More than 4 times per year    Marital Status: Widowed    Tobacco Counseling Ready to quit: Not Answered Counseling given: Not Answered Tobacco comments: smoking 0-3 cigs per day   Clinical Intake:                        Activities of Daily Living     No data to display          Patient Care Team: Lorayne Bender, MD as PCP - General Jodelle Red, MD as PCP - Cardiology (Cardiology) Meryl Dare, MD as Consulting Physician (Gastroenterology) Lilian Kapur Rachelle Hora, DPM as Consulting Physician (Podiatry) Fredrik Cove, Di Kindle, DMD (Dentistry)  Indicate any recent Medical Services you may have received from other than [facility] providers in the past year (date may be approximate).     Assessment:    This is a routine wellness examination for [place].  Hearing/Vision screen No results found.  Dietary issues and exercise activities discussed:     Goals Addressed   None    Depression Screen    09/27/2022    9:50 AM 04/06/2022    2:59 PM 03/10/2022   10:09 AM 09/29/2021    1:46 PM 07/22/2021    9:50 AM 11/26/2020   10:35 AM 04/07/2020    9:42 AM  PHQ 2/9 Scores  PHQ - 2 Score 0 0 1 0 0 0 0  PHQ- 9 Score 0 1 2 0 0 6 0    Fall Risk    09/27/2022    9:50 AM 03/10/2022   10:09 AM 09/29/2021    1:48 PM 07/22/2021    9:50 AM 11/26/2020   10:36 AM  Fall Risk   Falls in the past year? 0 0 0 0 1  Number falls in past yr: 0 0 0 0 0  Injury with Fall? 0 0 0 0 0  Risk for fall due to :   Impaired balance/gait;Orthopedic patient    Follow up   Falls prevention discussed      MEDICARE RISK AT HOME:   TIMED UP AND GO:  Was the test performed?  No    Cognitive Function:        09/29/2021    1:50 PM 01/09/2019    9:01 AM  6CIT Screen  What Year? 0 points 0 points  What  month? 0 points 0 points  What time? 0 points 0 points  Count back from 20 0 points 0 points  Months in reverse 4 points 4 points  Repeat phrase 2 points 0 points  Total Score 6 points 4 points    Immunizations Immunization History  Administered Date(s) Administered   Influenza Split 03/07/2012, 02/25/2020   Influenza Whole 05/03/2007, 04/17/2008, 05/21/2008, 03/25/2009   Influenza,inj,Quad PF,6+ Mos 03/09/2016, 04/11/2017, 07/22/2021   Influenza-Unspecified 03/17/2011, 04/06/2013, 04/11/2014, 05/11/2015   Moderna SARS-COV2 Booster Vaccination 03/23/2021   Moderna Sars-Covid-2 Vaccination 08/24/2019, 09/21/2019, 02/25/2020, 10/28/2020   PNEUMOCOCCAL CONJUGATE-20 03/10/2022   Pneumococcal Conjugate-13 07/08/2016   Pneumococcal Polysaccharide-23 07/27/2007, 05/15/2015   Td 07/12/2001   Tdap 05/01/2012   Zoster Recombinant(Shingrix) 08/29/2020, 10/28/2020    TDAP status: Due, Education has been  provided regarding the importance of this vaccine. Advised may receive this vaccine at local pharmacy or Health Dept. Aware to provide a copy of the vaccination record if obtained from local pharmacy or Health Dept. Verbalized acceptance and understanding.  Pneumococcal vaccine status: Up to date  Covid-19 vaccine status: Information provided on how to obtain vaccines.   Qualifies for Shingles Vaccine? Yes   Zostavax completed No   Shingrix Completed?: Yes  Screening Tests Health Maintenance  Topic Date Due   COVID-19 Vaccine (6 - 2023-24 season) 03/12/2022   DTaP/Tdap/Td (3 - Td or Tdap) 05/01/2022   Medicare Annual Wellness (AWV)  09/30/2022   INFLUENZA VACCINE  02/10/2023   MAMMOGRAM  09/14/2024   Colonoscopy  01/29/2028   Pneumonia Vaccine 64+ Years old  Completed   DEXA SCAN  Completed   Hepatitis C Screening  Completed   Zoster Vaccines- Shingrix  Completed   HPV VACCINES  Aged Out    Health Maintenance  Health Maintenance Due  Topic Date Due   COVID-19 Vaccine (6 - 2023-24 season) 03/12/2022   DTaP/Tdap/Td (3 - Td or Tdap) 05/01/2022   Medicare Annual Wellness (AWV)  09/30/2022    Colorectal cancer screening: Type of screening: Colonoscopy. Completed 01/28/21. Repeat every 7 years  Mammogram status: Completed 09/15/22. Repeat every year  Bone Density status: Completed 07/25/15. Results reflect: Bone density results: NORMAL. Repeat every 5 years.  Lung Cancer Screening: (Low Dose CT Chest recommended if Age 29-80 years, 20 pack-year currently smoking OR have quit w/in 15years.) does not qualify.   Lung Cancer Screening Referral: n/a  Additional Screening:  Hepatitis C Screening: does qualify; Completed 06/23/15  Vision Screening: Recommended annual ophthalmology exams for early detection of glaucoma and other disorders of the eye. Is the patient up to date with their annual eye exam?  {YES/NO:21197} Who is the provider or what is the name of the office in which  the patient attends annual eye exams? *** If pt is not established with a provider, would they like to be referred to a provider to establish care? {YES/NO:21197}.   Dental Screening: Recommended annual dental exams for proper oral hygiene  Community Resource Referral / Chronic Care Management: CRR required this visit?  {YES/NO:21197}  CCM required this visit?  {CCM Required [phone]}     Plan:     I have personally reviewed and noted the following in the patient's chart:   Medical and social history Use of alcohol, tobacco or illicit drugs  Current medications and supplements including opioid prescriptions. {Opioid [phone]} Functional ability and status Nutritional status Physical activity Advanced directives List of other physicians Hospitalizations, surgeries, and ER visits in previous 12 months Vitals Screenings to  include cognitive, depression, and falls Referrals and appointments  In addition, I have reviewed and discussed with patient certain preventive protocols, quality metrics, and best practice recommendations. A written personalized care plan for preventive services as well as general preventive health recommendations were provided to patient.     Kandis Fantasia Shepherd, [place]   08/11/8655   After Visit Summary: {CHL AMB AWV After Visit Summary:[phone]}  Nurse Notes: ***

## 2023-02-07 ENCOUNTER — Ambulatory Visit (INDEPENDENT_AMBULATORY_CARE_PROVIDER_SITE_OTHER): Payer: 59

## 2023-02-07 VITALS — Ht 69.0 in | Wt 207.0 lb

## 2023-02-07 DIAGNOSIS — Z0000 Encounter for general adult medical examination without abnormal findings: Secondary | ICD-10-CM | POA: Diagnosis not present

## 2023-03-25 ENCOUNTER — Other Ambulatory Visit (HOSPITAL_BASED_OUTPATIENT_CLINIC_OR_DEPARTMENT_OTHER): Payer: Self-pay | Admitting: Cardiology

## 2023-03-25 DIAGNOSIS — I10 Essential (primary) hypertension: Secondary | ICD-10-CM

## 2023-04-16 ENCOUNTER — Other Ambulatory Visit (HOSPITAL_BASED_OUTPATIENT_CLINIC_OR_DEPARTMENT_OTHER): Payer: Self-pay | Admitting: Cardiology

## 2023-04-16 DIAGNOSIS — I10 Essential (primary) hypertension: Secondary | ICD-10-CM

## 2023-05-07 ENCOUNTER — Other Ambulatory Visit (HOSPITAL_BASED_OUTPATIENT_CLINIC_OR_DEPARTMENT_OTHER): Payer: Self-pay | Admitting: Cardiology

## 2023-05-07 DIAGNOSIS — I10 Essential (primary) hypertension: Secondary | ICD-10-CM

## 2023-06-12 NOTE — Patient Instructions (Addendum)
Thank you for coming in today! Here is a summary of what we discussed:  Please continue taking your medications as prescribed. Please send me a MyChart message or call the clinic if you have issues getting your medications or need refills.  Try the Voltaren ointment for your knee pain. You can also use Tylenol as needed. Please see the attached sheet for more recommendations.   We are checking some labs today. If they are abnormal, I will call you. If they are normal, I will send you a MyChart message (if it is active) or a letter in the mail. If you do not hear about your labs in the next 2 weeks, please call the office.   You should return to our clinic 08/15/23 and 10:05AM.  If you haven't already, sign up for My Chart to have easy access to your labs results, and communication with your primary care physician.  I recommend that you always bring your medications to each appointment as this makes it easy to ensure you are on the correct medications and helps [place] not miss refills when you need them.  Please call the clinic at [phone] if your symptoms worsen or you have any concerns.  Best, Dr Dolan Amen

## 2023-06-17 ENCOUNTER — Other Ambulatory Visit: Payer: Self-pay | Admitting: Family Medicine

## 2023-06-17 ENCOUNTER — Ambulatory Visit (INDEPENDENT_AMBULATORY_CARE_PROVIDER_SITE_OTHER): Payer: 59 | Admitting: Family Medicine

## 2023-06-17 ENCOUNTER — Encounter: Payer: Self-pay | Admitting: Family Medicine

## 2023-06-17 VITALS — BP 106/65 | HR 58 | Ht 69.0 in | Wt 206.4 lb

## 2023-06-17 DIAGNOSIS — E782 Mixed hyperlipidemia: Secondary | ICD-10-CM | POA: Diagnosis not present

## 2023-06-17 DIAGNOSIS — Z8679 Personal history of other diseases of the circulatory system: Secondary | ICD-10-CM | POA: Diagnosis not present

## 2023-06-17 DIAGNOSIS — I10 Essential (primary) hypertension: Secondary | ICD-10-CM

## 2023-06-17 DIAGNOSIS — F17200 Nicotine dependence, unspecified, uncomplicated: Secondary | ICD-10-CM | POA: Diagnosis not present

## 2023-06-17 DIAGNOSIS — G8929 Other chronic pain: Secondary | ICD-10-CM

## 2023-06-17 DIAGNOSIS — R7303 Prediabetes: Secondary | ICD-10-CM

## 2023-06-17 DIAGNOSIS — M25561 Pain in right knee: Secondary | ICD-10-CM | POA: Diagnosis not present

## 2023-06-17 DIAGNOSIS — R0981 Nasal congestion: Secondary | ICD-10-CM | POA: Diagnosis not present

## 2023-06-17 DIAGNOSIS — M25562 Pain in left knee: Secondary | ICD-10-CM | POA: Diagnosis not present

## 2023-06-17 DIAGNOSIS — I2510 Atherosclerotic heart disease of native coronary artery without angina pectoris: Secondary | ICD-10-CM | POA: Diagnosis not present

## 2023-06-17 LAB — POCT GLYCOSYLATED HEMOGLOBIN (HGB A1C): Hemoglobin A1C: 5.6 % (ref 4.0–5.6)

## 2023-06-17 MED ORDER — FUROSEMIDE 20 MG PO TABS: ORAL_TABLET | ORAL | 2 refills | Status: DC

## 2023-06-17 MED ORDER — SPIRONOLACTONE 25 MG PO TABS: 25.0000 mg | ORAL_TABLET | Freq: Every day | ORAL | 2 refills | Status: DC

## 2023-06-17 MED ORDER — DICLOFENAC SODIUM 1 % EX GEL
2.0000 g | Freq: Four times a day (QID) | CUTANEOUS | 1 refills | Status: AC | PRN
Start: 2023-06-17 — End: ?

## 2023-06-17 MED ORDER — ASPIRIN 81 MG PO TBEC: 81.0000 mg | DELAYED_RELEASE_TABLET | Freq: Every day | ORAL | 11 refills | Status: AC

## 2023-06-17 MED ORDER — OXYMETAZOLINE HCL 0.05 % NA SOLN
1.0000 | Freq: Two times a day (BID) | NASAL | 2 refills | Status: DC | PRN
Start: 2023-06-17 — End: 2023-09-07

## 2023-06-17 MED ORDER — ROSUVASTATIN CALCIUM 20 MG PO TABS: 20.0000 mg | ORAL_TABLET | Freq: Every day | ORAL | 2 refills | Status: DC

## 2023-06-17 NOTE — Assessment & Plan Note (Addendum)
Compliant with daily statin. Will check lipid panel today.

## 2023-06-17 NOTE — Assessment & Plan Note (Signed)
Discussed importance of cessation. Scheduled follow up visit to discuss cessation options more thoroughly. Low dose screening CT not indicated as pt has <20 pack-year history.

## 2023-06-17 NOTE — Assessment & Plan Note (Signed)
BP: 106/65 today. Well controlled. Goal of <130/80. Continue to work on healthy dietary habits and exercise. Refilled medications. Checking BMP today. Follow up in 2 months.   Medication regimen: hydral 50 BID, isordil 20 BID, lisinopril 40 daily, metop 25 daily, spiro 25 daily

## 2023-06-17 NOTE — Progress Notes (Signed)
    SUBJECTIVE:   CHIEF COMPLAINT / HPI:   HTN -checks at home, systolic 100s-130s -denies dizziness, lightheadedness -taking all medications as prescribed, needs refills today  Knee pain -noticed this over the summer -aching pain at night especially after doing a lot of activity during the day -never wakes from sleep with pain -no fevers, chills  Prediabetes -A1c 5.8 last year -has been trying to change diet and lose weight  Tobacco use -has been smoking 3-4 cigarettes per day since she was a child -has tried cutting back, Chantix, gum -has not tried nicotine patches because someone told her not to since her parents died of colon and breast cancer  PERTINENT  PMH / PSH: HTN, CAD, HLD, tobacco abuse, prediabetes  OBJECTIVE:   BP 106/65   Pulse (!) 58   Ht 5\' 9"  (1.753 m)   Wt 206 lb 6 oz (93.6 kg)   SpO2 100%   BMI 30.48 kg/m    Initial BP 140/68  Gen: sitting in exam room, awake and conversant, in NAD CV: RRR, normal S1/S2 Pulm: CTAB, normal WOB on RA Neuro: normal gait, strength grossly normal Psych: appropriate mood and affect  ASSESSMENT/PLAN:   Essential hypertension BP: 106/65 today. Well controlled. Goal of <130/80. Continue to work on healthy dietary habits and exercise. Refilled medications. Checking BMP today. Follow up in 2 months.   Medication regimen: hydral 50 BID, isordil 20 BID, lisinopril 40 daily, metop 25 daily, spiro 25 daily   Hyperlipidemia Compliant with daily statin. Will check lipid panel today.  Prediabetes Reports dietary changes. Will check A1c today. Provided handout with prediabetes diet recommendations.  TOBACCO ABUSE Discussed importance of cessation. Scheduled follow up visit to discuss cessation options more thoroughly. Low dose screening CT not indicated as pt has <20 pack-year history.     Lorayne Bender, MD [facility]

## 2023-06-17 NOTE — Assessment & Plan Note (Signed)
Reports dietary changes. Will check A1c today. Provided handout with prediabetes diet recommendations.

## 2023-06-18 LAB — BASIC METABOLIC PANEL
BUN/Creatinine Ratio: 11 — ABNORMAL LOW (ref 12–28)
BUN: 12 mg/dL (ref 8–27)
CO2: 22 mmol/L (ref 20–29)
Calcium: 9.7 mg/dL (ref 8.7–10.3)
Chloride: 107 mmol/L — ABNORMAL HIGH (ref 96–106)
Creatinine, Ser: 1.14 mg/dL — ABNORMAL HIGH (ref 0.57–1.00)
Glucose: 91 mg/dL (ref 70–99)
Potassium: 4.9 mmol/L (ref 3.5–5.2)
Sodium: 141 mmol/L (ref 134–144)
eGFR: 51 mL/min/{1.73_m2} — ABNORMAL LOW (ref >59)

## 2023-06-18 LAB — LIPID PANEL
Chol/HDL Ratio: 2.8 {ratio} (ref 0.0–4.4)
Cholesterol, Total: 139 mg/dL (ref 100–199)
HDL: 49 mg/dL (ref >39)
LDL Chol Calc (NIH): 67 mg/dL (ref 0–99)
Triglycerides: 133 mg/dL (ref 0–149)
VLDL Cholesterol Cal: 23 mg/dL (ref 5–40)

## 2023-06-20 ENCOUNTER — Encounter: Payer: Self-pay | Admitting: Family Medicine

## 2023-07-21 ENCOUNTER — Other Ambulatory Visit: Payer: Self-pay

## 2023-07-25 MED ORDER — METOPROLOL SUCCINATE ER 25 MG PO TB24: 25.0000 mg | ORAL_TABLET | Freq: Two times a day (BID) | ORAL | 2 refills | Status: DC

## 2023-07-25 MED ORDER — POTASSIUM CHLORIDE CRYS ER 20 MEQ PO TBCR: 20.0000 meq | EXTENDED_RELEASE_TABLET | Freq: Two times a day (BID) | ORAL | 2 refills | Status: DC

## 2023-08-10 ENCOUNTER — Other Ambulatory Visit: Payer: Self-pay | Admitting: Family Medicine

## 2023-08-10 DIAGNOSIS — Z1231 Encounter for screening mammogram for malignant neoplasm of breast: Secondary | ICD-10-CM

## 2023-08-15 ENCOUNTER — Encounter: Payer: Self-pay | Admitting: Family Medicine

## 2023-08-15 ENCOUNTER — Ambulatory Visit (INDEPENDENT_AMBULATORY_CARE_PROVIDER_SITE_OTHER): Payer: 59 | Admitting: Family Medicine

## 2023-08-15 VITALS — BP 136/76 | HR 71 | Ht 68.0 in | Wt 206.2 lb

## 2023-08-15 DIAGNOSIS — M25561 Pain in right knee: Secondary | ICD-10-CM

## 2023-08-15 DIAGNOSIS — G8929 Other chronic pain: Secondary | ICD-10-CM | POA: Diagnosis not present

## 2023-08-15 DIAGNOSIS — M25562 Pain in left knee: Secondary | ICD-10-CM

## 2023-08-15 DIAGNOSIS — F17200 Nicotine dependence, unspecified, uncomplicated: Secondary | ICD-10-CM | POA: Diagnosis not present

## 2023-08-15 MED ORDER — NICOTINE 7 MG/24HR TD PT24: 7.0000 mg | MEDICATED_PATCH | Freq: Every day | TRANSDERMAL | 0 refills | Status: DC

## 2023-08-15 MED ORDER — NICOTINE POLACRILEX 2 MG MT GUM: 2.0000 mg | CHEWING_GUM | OROMUCOSAL | 0 refills | Status: DC | PRN

## 2023-08-15 NOTE — Patient Instructions (Addendum)
Thank you for coming in today! Here is a summary of what we discussed:  Try the nicotine patch daily with nicotine gum in the morning when you'd normally have your first cigarette of the day. Make sure you get the lowest dose nicotine patch (7mg ) and lowest dose gum (2mg ) The goal is to reduce the patch to a few times a week and then not at all.  Please contact the cardiology office (Dr Cristal Deer) to make a follow up appointment. [address] [place], [place] [address] [phone]  If you haven't already, sign up for My Chart to have easy access to your labs results, and communication with your primary care physician.  I recommend that you always bring your medications to each appointment as this makes it easy to ensure you are on the correct medications and helps [place] not miss refills when you need them.  Please call the clinic at [phone] if your symptoms worsen or you have any concerns.  Best, Dr Dolan Amen

## 2023-08-15 NOTE — Assessment & Plan Note (Signed)
Provided counseling on benefits of reducing and ideally eliminating cigarette use. Patient open to trying nicotine replacement with nicotine patches and gum. --Daily 7mg  Nicotine patch --Nicotine gum in the morning to replace AM cigarette --scheduled follow up in 2 weeks

## 2023-08-15 NOTE — Progress Notes (Cosign Needed)
    SUBJECTIVE:   CHIEF COMPLAINT / HPI:    Bilateral knee pain -notices more in the cold weather -describes as achy -worse with activity -no joint swelling -has been taking tylenol as discussed at prior visit, this is helping -not bothering her too much  Tobacco abuse -smokes 1-3 cigarettes per day -always has a morning cigarette with her coffee -has tried Chantix, nicotine lozenge, gum -was told not to use nicotine patches in the past due to fhx of cancer, discussed that this is not a contraindication   PERTINENT  PMH / PSH: HTN, CAD, tobacco abuse  OBJECTIVE:   BP 136/76   Pulse 71   Ht 5\' 8"  (1.727 m)   Wt 206 lb 3.2 oz (93.5 kg)   SpO2 97%   BMI 31.35 kg/m   Gen: sitting comfortably in exam room, pleasant and conversant, in NAD Pulm: even, non labored breathing on room air Neuro: no focal deficits Psych: appropriate mood and affect  Assessment & Plan TOBACCO ABUSE Provided counseling on benefits of reducing and ideally eliminating cigarette use. Patient open to trying nicotine replacement with nicotine patches and gum. --Daily 7mg  Nicotine patch --Nicotine gum in the morning to replace AM cigarette --scheduled follow up in 2 weeks Chronic pain of both knees Mild, well controlled with tylenol. Can consider imaging in the future if symptoms worsen.    Lorayne Bender, MD [facility]

## 2023-08-22 ENCOUNTER — Other Ambulatory Visit: Payer: Self-pay

## 2023-08-22 MED ORDER — ISOSORBIDE DINITRATE 20 MG PO TABS: ORAL_TABLET | ORAL | 0 refills | Status: DC

## 2023-09-01 ENCOUNTER — Ambulatory Visit: Payer: 59 | Admitting: Family Medicine

## 2023-09-01 ENCOUNTER — Ambulatory Visit: Payer: Self-pay | Admitting: Family Medicine

## 2023-09-06 ENCOUNTER — Ambulatory Visit (HOSPITAL_BASED_OUTPATIENT_CLINIC_OR_DEPARTMENT_OTHER): Payer: 59 | Admitting: Family

## 2023-09-06 ENCOUNTER — Encounter (HOSPITAL_BASED_OUTPATIENT_CLINIC_OR_DEPARTMENT_OTHER): Payer: Self-pay | Admitting: Family

## 2023-09-06 VITALS — BP 118/72 | HR 64 | Ht 68.0 in | Wt 207.5 lb

## 2023-09-06 DIAGNOSIS — I25118 Atherosclerotic heart disease of native coronary artery with other forms of angina pectoris: Secondary | ICD-10-CM | POA: Diagnosis not present

## 2023-09-06 DIAGNOSIS — E785 Hyperlipidemia, unspecified: Secondary | ICD-10-CM | POA: Diagnosis not present

## 2023-09-06 DIAGNOSIS — I10 Essential (primary) hypertension: Secondary | ICD-10-CM | POA: Diagnosis not present

## 2023-09-06 DIAGNOSIS — Z720 Tobacco use: Secondary | ICD-10-CM

## 2023-09-06 NOTE — Patient Instructions (Addendum)
 Medication Instructions:  Your physician recommends that you continue on your current medications as directed. Please refer to the Current Medication list given to you today.  Follow-Up: At [facility], you and your health needs are our priority.  As part of our continuing mission to provide you with exceptional heart care, we have created designated Provider Care Teams.  These Care Teams include your primary Cardiologist (physician) and Advanced Practice Providers (APPs -  Physician Assistants and Nurse Practitioners) who all work together to provide you with the care you need, when you need it.  We recommend signing up for the patient portal called "MyChart".  Sign up information is provided on this After Visit Summary.  MyChart is used to connect with patients for Virtual Visits (Telemedicine).  Patients are able to view lab/test results, encounter notes, upcoming appointments, etc.  Non-urgent messages can be sent to your provider as well.   To learn more about what you can do with MyChart, go to [email or web address].    Your next appointment:   1 year with Dr. Cristal Deer   Other Instructions

## 2023-09-06 NOTE — Progress Notes (Signed)
 Cardiology Office Note:  .   Date:  09/10/2023  ID:  Elmarie Mainland, DOB [birth year], MRN [id number] PCP: Lorayne Bender, MD  [facility] Providers Cardiologist:  Jodelle Red, MD    History of Present Illness: .   Lindsey Payne is a 73 y.o. female with hx of HLD, HTN, CAD, tobacco use, heart failure with recovered LVEF.   Presents today for follow up. Spends significant amount of her time with her son which she enjoys. She is walking in her home for exercise. Blood pressure at home 130/60s. Reports no shortness of breath nor dyspnea on exertion. Reports no chest pain, pressure, or tightness. No edema, orthopnea, PND. Reports no palpitations.  One pack of cigarettes last her 3-4 days. She is working with her primary care provider on cessation.    ROS: Please see the history of present illness.    All other systems reviewed and are negative.   Studies Reviewed: Marland Kitchen   EKG Interpretation Date/Time:  Tuesday September 06 2023 08:48:00 EST Ventricular Rate:  60 PR Interval:  174 QRS Duration:  84 QT Interval:  416 QTC Calculation: 416 R Axis:   32  Text Interpretation: Normal sinus rhythm TWI lead II, III, aVF more pronounced than prior ECG. Poor R wave progression Confirmed by Gillian Shields ([id number]) on 09/06/2023 8:59:55 AM    Cardiac Studies & Procedures   ______________________________________________________________________________________________   STRESS TESTS  MYOCARDIAL PERFUSION IMAGING 09/12/2020  Narrative  The left ventricular ejection fraction is mildly decreased (45-54%).  Nuclear stress EF: 53%.  There was no ST segment deviation noted during stress.  This is a low risk study.  Low risk stress nuclear study with normal perfusion and borderline reduced left ventricular global systolic function.   ECHOCARDIOGRAM  ECHOCARDIOGRAM COMPLETE 10/09/2020  Narrative ECHOCARDIOGRAM REPORT    Patient Name:   Lindsey Payne Date of Exam: 10/09/2020 Medical Rec #:  [id number]                    Height:       69.0 in Accession #:    [id number]                   Weight:       209.2 lb Date of Birth:  Jun 04, 1951                   BSA:          2.106 m Patient Age:    69 years                     BP:           138/84 mmHg Patient Gender: F                            HR:           60 bpm. Exam Location:  [address]  Procedure: 2D Echo, 3D Echo, Cardiac Doppler and Color Doppler  Indications:    R06.00 Dyspnea  History:        Patient has no prior history of Echocardiogram examinations. CHF, CAD, Signs/Symptoms:Dyspnea; Risk Factors:Dyslipidemia, Hypertension, Family History of Coronary Artery Disease and Current Smoker. Asthma.  Sonographer:    Farrel Conners RDCS Referring Phys: [id number] BRIDGETTE CHRISTOPHER  IMPRESSIONS   1. Left ventricular ejection fraction, by estimation, is 60 to 65%. Left ventricular ejection fraction by 3D  volume is 63 %. The left ventricle has normal function. The left ventricle has no regional wall motion abnormalities. Left ventricular diastolic parameters are consistent with Grade I diastolic dysfunction (impaired relaxation). 2. Right ventricular systolic function is normal. The right ventricular size is normal. Tricuspid regurgitation signal is inadequate for assessing PA pressure. 3. The mitral valve is grossly normal. No evidence of mitral valve regurgitation. No evidence of mitral stenosis. 4. The aortic valve is tricuspid. Aortic valve regurgitation is not visualized. No aortic stenosis is present. 5. The inferior vena cava is normal in size with greater than 50% respiratory variability, suggesting right atrial pressure of 3 mmHg.  FINDINGS Left Ventricle: Left ventricular ejection fraction, by estimation, is 60 to 65%. Left ventricular ejection fraction by 3D volume is 63 %. The left ventricle has normal function. The left ventricle has no regional wall  motion abnormalities. The left ventricular internal cavity size was normal in size. There is no left ventricular hypertrophy. Left ventricular diastolic parameters are consistent with Grade I diastolic dysfunction (impaired relaxation).  Right Ventricle: The right ventricular size is normal. No increase in right ventricular wall thickness. Right ventricular systolic function is normal. Tricuspid regurgitation signal is inadequate for assessing PA pressure.  Left Atrium: Left atrial size was normal in size.  Right Atrium: Right atrial size was normal in size.  Pericardium: Trivial pericardial effusion is present. Presence of pericardial fat pad.  Mitral Valve: The mitral valve is grossly normal. No evidence of mitral valve regurgitation. No evidence of mitral valve stenosis.  Tricuspid Valve: The tricuspid valve is grossly normal. Tricuspid valve regurgitation is not demonstrated. No evidence of tricuspid stenosis.  Aortic Valve: The aortic valve is tricuspid. Aortic valve regurgitation is not visualized. No aortic stenosis is present.  Pulmonic Valve: The pulmonic valve was grossly normal. Pulmonic valve regurgitation is not visualized. No evidence of pulmonic stenosis.  Aorta: The aortic root and ascending aorta are structurally normal, with no evidence of dilitation.  Venous: The inferior vena cava is normal in size with greater than 50% respiratory variability, suggesting right atrial pressure of 3 mmHg.  IAS/Shunts: The atrial septum is grossly normal.   LEFT VENTRICLE PLAX 2D LVIDd:         5.10 cm         Diastology LVIDs:         3.80 cm         LV e' medial:    4.35 cm/s LV PW:         1.10 cm         LV E/e' medial:  15.9 LV IVS:        1.00 cm         LV e' lateral:   6.96 cm/s LVOT diam:     2.10 cm         LV E/e' lateral: 9.9 LV SV:         52 LV SV Index:   25 LVOT Area:     3.46 cm        3D Volume EF LV 3D EF:    Left ventricular ejection fraction by 3D  volume is 63 %.  3D Volume EF: 3D EF:        63 % LV EDV:       136 ml LV ESV:       50 ml LV SV:        86 ml  RIGHT VENTRICLE RV S prime:  9.90 cm/s TAPSE (M-mode): 1.9 cm  LEFT ATRIUM             Index       RIGHT ATRIUM           Index LA diam:        4.30 cm 2.04 cm/m  RA Area:     13.80 cm LA Vol (A2C):   50.7 ml 24.08 ml/m RA Volume:   30.00 ml  14.25 ml/m LA Vol (A4C):   55.5 ml 26.35 ml/m LA Biplane Vol: 55.0 ml 26.12 ml/m AORTIC VALVE LVOT Vmax:   63.50 cm/s LVOT Vmean:  38.700 cm/s LVOT VTI:    0.149 m  AORTA Ao Root diam: 3.10 cm Ao Asc diam:  3.40 cm  MITRAL VALVE MV Area (PHT): cm         SHUNTS MV Decel Time: 432 msec    Systemic VTI:  0.15 m MV E velocity: 69.00 cm/s  Systemic Diam: 2.10 cm MV A velocity: 87.00 cm/s MV E/A ratio:  0.79  Lennie Odor MD Electronically signed by Lennie Odor MD Signature Date/Time: 10/09/2020/11:53:11 AM    Final          ______________________________________________________________________________________________      Risk Assessment/Calculations:            Physical Exam:   VS:  BP 118/72   Pulse 64   Ht 5\' 8"  (1.727 m)   Wt 207 lb 8 oz (94.1 kg)   SpO2 98%   BMI 31.55 kg/m    Wt Readings from Last 3 Encounters:  09/07/23 206 lb 6.4 oz (93.6 kg)  09/06/23 207 lb 8 oz (94.1 kg)  08/15/23 206 lb 3.2 oz (93.5 kg)    GEN: Well nourished, overweight, well developed in no acute distress NECK: No JVD; No carotid bruits CARDIAC: RRR, no murmurs, rubs, gallops RESPIRATORY:  Clear to auscultation without rales, wheezing or rhonchi  ABDOMEN: Soft, non-tender, non-distended EXTREMITIES:  No edema; No deformity   ASSESSMENT AND PLAN: .    CAD / HLD, LDL goal <70 - 06/2023 LDL 67. EKG today NSR 60 bpm with TWI lead II, III, aVF more pronounced than prior ECG. Given lack of anginal symptoms, continue present medical therapy Isordil 20mg  TID, Toprol 25mg  daily, Rosuvastatin 20mg  daily. Recommend  aiming for 150 minutes of moderate intensity activity per week and following a heart healthy diet.    Tobacco use - Offered referral to health coach which she politely declined. Working closely with primary care team on cessation.   BMI 31 -  Walking in her home for exercise. Weight loss via diet and exercise encouraged. Discussed the impact being overweight would have on cardiovascular risk. Discussed referral to PREP exercise program at the [facility], she will contact us if interested.        Dispo: follow up with Dr. Cristal Deer in 1 year  Signed, Alver Sorrow, NP

## 2023-09-07 ENCOUNTER — Ambulatory Visit (INDEPENDENT_AMBULATORY_CARE_PROVIDER_SITE_OTHER): Payer: 59 | Admitting: Family Medicine

## 2023-09-07 ENCOUNTER — Encounter: Payer: Self-pay | Admitting: Family Medicine

## 2023-09-07 VITALS — BP 118/64 | HR 71 | Wt 206.4 lb

## 2023-09-07 DIAGNOSIS — F17200 Nicotine dependence, unspecified, uncomplicated: Secondary | ICD-10-CM

## 2023-09-07 MED ORDER — NICOTINE 7 MG/24HR TD PT24
7.0000 mg | MEDICATED_PATCH | Freq: Every day | TRANSDERMAL | 0 refills | Status: DC
Start: 2023-09-07 — End: 2023-11-30

## 2023-09-07 NOTE — Patient Instructions (Addendum)
 It was great to see you today! Thank you for choosing [facility] for your primary care.  Today we addressed:  Smoking cessation You are doing great!  Please continue your hard efforts to quit the way you did 4 years ago.  Keep trying and it will work!  Start nicotine patch 7 mg daily as needed. Potential side effects, including nausea, hiccups, cough, and heartburn, but these are very rare with patch.  You can call 1-800-QUIT-NOW to get more support and resources for quitting.  Please do so.  Our pharmacist Dr. Raymondo Band will call you to follow up on your progress.  I have placed a low dose CT scan order to screen for lung cancer, our CMA will call you to set up an appointment. Please give [place] a call if you don't hear back in the next 2 weeks.  You should return to our clinic in 3 months.  Thank you for coming to see [place] at [facility] and for the opportunity to care for you! Lindsey Soave, MD 09/07/2023, 10:23 AM

## 2023-09-07 NOTE — Progress Notes (Signed)
   SUBJECTIVE:   CHIEF COMPLAINT / HPI:  Lindsey Payne is a 73 y.o. female with a pertinent past medical history of colonic polyps, HLD, HTN, CAD, and tobacco use disorder presenting to the clinic for tobacco cessation follow up.   Tobacco cessation Seen on 08/15/2023.  Was smoking 1-3 cigarettes per day at the time.  On further discussion, may be smoking a bit more given that a pack lasts her about 3 days. Historic tobacco use: Smoked ~0.5 pack/day x 64 years (~30 pack year history) since age 73. Does qualify for lung cancer screen at this time. Usually smokes a cigarette with morning coffee and another with dinner. Has previously tried Chantix, nicotine lozenge, gum. Has tried occupying hands. Prescribed 7 mg nicotine patch and nicotine gum AM that visit. Did not get nicotine patch after the visit. Did quit for 2 months once about 4 years ago through willpower, but others got her smoking again with their influence when visiting her at home.   PERTINENT PMH / PSH: CAD, HTN, HFpEF, tubular adenoma of colon, tobacco use disorder   OBJECTIVE:   BP 118/64   Pulse 71   Wt 206 lb 6.4 oz (93.6 kg)   SpO2 97%   BMI 31.38 kg/m   General: Age-appropriate, resting comfortably in chair, NAD, alert and at baseline. Cardiovascular: Regular rate and rhythm. Normal S1/S2. No murmurs, rubs, or gallops appreciated. 2+ radial pulses. Pulmonary: Clear bilaterally to ascultation. No wheezes, crackles, or rhonchi. No increased WOB, no accessory muscle usage on room air. Skin: Warm and dry. Extremities: No peripheral edema bilaterally. Capillary refill <2 seconds.   ASSESSMENT/PLAN:   Assessment & Plan Tobacco use disorder Patient was counseled on the risks of tobacco use and cessation strongly encouraged. Discussed various methods of smoking cessation: Use a timer between smokes, occupying hands as that may be a trigger, making it difficult for self to access pack of  cigarettes.  Opted to start with Nicotine transdermal patch for smoking cessation controller medication. Patches should be changed daily. -Started on 7 mg daily since only smoking ~2-3 cigarettes some days -Side effects discussed and provided in AVS -1-800-QUIT-NOW provided -Dr. Raymondo Band contacted to assist with following up remotely -Low-dose CT scan ordered given smoking history >20 years and current smoker aged 42-80  Return in about 3 months (around 12/05/2023) for smoking cessation.  Lindsey Iacobucci Sharion Dove, MD [facility]

## 2023-09-07 NOTE — Assessment & Plan Note (Signed)
 Patient was counseled on the risks of tobacco use and cessation strongly encouraged. Discussed various methods of smoking cessation: Use a timer between smokes, occupying hands as that may be a trigger, making it difficult for self to access pack of cigarettes.  Opted to start with Nicotine transdermal patch for smoking cessation controller medication. Patches should be changed daily. -Started on 7 mg daily since only smoking ~2-3 cigarettes some days -Side effects discussed and provided in AVS -1-800-QUIT-NOW provided -Dr. Raymondo Band contacted to assist with following up remotely -Low-dose CT scan ordered given smoking history >20 years and current smoker aged [age]

## 2023-09-16 ENCOUNTER — Telehealth: Payer: Self-pay | Admitting: Pharmacist

## 2023-09-16 ENCOUNTER — Ambulatory Visit: Payer: 59

## 2023-09-16 NOTE — Telephone Encounter (Signed)
 Patient contacted for follow/up of tobacco intake reduction / cessation attempt.  First contact - referred by Dr. Sharion Dove.   Since last contact patient reports she has decreased to 1 cigarette per day for about 1 week.  She reports this single cigarette is with her coffee in the AM.   Medications currently being used;  None.   Rates CONFIDENCE of quitting tobacco  in the next three weeks on 1-10 scale of 5.  Encouraged to continue with quit plan.  Patient accepted offer for me to follow-up by phone in ~ 6 weeks to determine smoking status.   No visit planned at this time.   Total time with patient call and documentation of interaction: 12 minutes. Follow-up phone call planned: mid-April

## 2023-09-16 NOTE — Telephone Encounter (Signed)
-----   Message from [employer] sent at 09/07/2023  7:40 PM EST ----- Hi, Dr. Raymondo Band.  Another patient working on quitting smoking.  ~30 pack-year hx.  QUIT NOW number provided.  Started 7 mg patch given 2-3 cigarettes daily.  Previously tried varenicline and resistant.  Thanks!

## 2023-09-19 ENCOUNTER — Telehealth: Payer: Self-pay

## 2023-09-19 NOTE — Telephone Encounter (Signed)
 Called radiology and spoke with [facility] to schedule a CT lung cancer screening for patient.  Was able to schedule it for 09/22/2023 @ 1pm Check in time will be at 12:30pm Location would be at [facility].  Called patient to inform her about her upcoming CT screening appointment, Patient was aware due to her son calling to inform her about the appointment from her MyChart.  Did inform patient that check in time is at 12:30pm and to go through entrance A.  Patient stated thanks and that she will be there at the appointment.  Lindsey Payne, CMA

## 2023-09-19 NOTE — Telephone Encounter (Signed)
 Reviewed and agree with Dr Macky Lower plan.

## 2023-09-19 NOTE — Telephone Encounter (Signed)
-----   Message from [facility] Jasmine December S sent at 09/19/2023  9:30 AM EDT ----- Ok to schedule CT lung cancer screening at [facility].  Thanks, Melvenia Beam

## 2023-09-22 ENCOUNTER — Ambulatory Visit (HOSPITAL_COMMUNITY)
Admission: RE | Admit: 2023-09-22 | Discharge: 2023-09-22 | Disposition: A | Discharge status: Home / Self Care | Source: Ambulatory Visit | Attending: Family Medicine | Admitting: Family Medicine

## 2023-09-22 DIAGNOSIS — I2510 Atherosclerotic heart disease of native coronary artery without angina pectoris: Secondary | ICD-10-CM | POA: Insufficient documentation

## 2023-09-22 DIAGNOSIS — F17200 Nicotine dependence, unspecified, uncomplicated: Secondary | ICD-10-CM

## 2023-09-22 DIAGNOSIS — J439 Emphysema, unspecified: Secondary | ICD-10-CM | POA: Insufficient documentation

## 2023-09-22 DIAGNOSIS — F17210 Nicotine dependence, cigarettes, uncomplicated: Secondary | ICD-10-CM | POA: Diagnosis not present

## 2023-09-22 DIAGNOSIS — I700 Atherosclerosis of aorta: Secondary | ICD-10-CM | POA: Diagnosis not present

## 2023-09-22 DIAGNOSIS — Z122 Encounter for screening for malignant neoplasm of respiratory organs: Secondary | ICD-10-CM | POA: Diagnosis not present

## 2023-09-27 ENCOUNTER — Ambulatory Visit
Admission: RE | Admit: 2023-09-27 | Discharge: 2023-09-27 | Disposition: A | Discharge status: Home / Self Care | Source: Ambulatory Visit

## 2023-09-27 DIAGNOSIS — Z1231 Encounter for screening mammogram for malignant neoplasm of breast: Secondary | ICD-10-CM | POA: Diagnosis not present

## 2023-10-16 ENCOUNTER — Other Ambulatory Visit: Payer: Self-pay | Admitting: Family Medicine

## 2023-10-19 ENCOUNTER — Encounter: Payer: Self-pay | Admitting: Family Medicine

## 2023-10-19 DIAGNOSIS — D3502 Benign neoplasm of left adrenal gland: Secondary | ICD-10-CM | POA: Insufficient documentation

## 2023-10-19 DIAGNOSIS — J439 Emphysema, unspecified: Secondary | ICD-10-CM | POA: Insufficient documentation

## 2023-10-19 DIAGNOSIS — D3501 Benign neoplasm of right adrenal gland: Secondary | ICD-10-CM | POA: Insufficient documentation

## 2023-10-20 ENCOUNTER — Telehealth: Payer: Self-pay | Admitting: Family Medicine

## 2023-10-20 DIAGNOSIS — D3502 Benign neoplasm of left adrenal gland: Secondary | ICD-10-CM

## 2023-10-21 NOTE — Telephone Encounter (Signed)
 Noted emphysema and probable L adrenal adenoma, added to problem list.  Called patient to discuss results.  Ordered adrenal CT to follow up adenoma per Radiology recs and after shared decision-making with patient.  Also scheduled follow up visit in clinic on April 17th @ 9:15 AM with myself. Plan to do dexamethasone suppression test with 1 mg dexamethasone and return following morning at 8 AM for follow up labs: AM cortisol, DHEAS, and renin-aldosterone ratio. RAR interpretation may be complicated by spironolactone use, but will use clinical judgement with interpretation and low threshold to hold spiro, refer to Endo. Metanephrines only necessary if imaging shows lipid rich adenoma.

## 2023-10-21 NOTE — Telephone Encounter (Signed)
 Patient returning Shitarev's call. Please call patient back when you get a chance.

## 2023-10-24 ENCOUNTER — Telehealth: Payer: Self-pay | Admitting: Pharmacist

## 2023-10-24 NOTE — Telephone Encounter (Signed)
 Patient contacted for follow/up of tobacco cessation attempt.   Since last contact patient reports smoking 1/2 of a cigarette per day over the last few days.  Verbalized commitment to quitting.   Has PCP visit on 4/17 (3 days from now).  Encouraged patient to quit for at least 1 day prior to that visit.    Total time with patient call and documentation of interaction: 5 minutes. Follow-up phone call planned: 1 month

## 2023-10-25 ENCOUNTER — Telehealth: Payer: Self-pay

## 2023-10-25 NOTE — Telephone Encounter (Signed)
 Patient scheduled for Adrenal CT at [facility] on 11/02/2023 at 0930.  Called her to make sure that she received notification through [facility] CHART.  Patient is aware of appointment.  Gerold Kos, CMA

## 2023-10-25 NOTE — Telephone Encounter (Signed)
 Reviewed and agree with Dr Macky Lower plan.

## 2023-10-27 ENCOUNTER — Ambulatory Visit (INDEPENDENT_AMBULATORY_CARE_PROVIDER_SITE_OTHER): Payer: Self-pay | Admitting: Family Medicine

## 2023-10-27 ENCOUNTER — Encounter: Payer: Self-pay | Admitting: Family Medicine

## 2023-10-27 VITALS — BP 118/70 | HR 78 | Ht 68.0 in | Wt 203.0 lb

## 2023-10-27 DIAGNOSIS — D3502 Benign neoplasm of left adrenal gland: Secondary | ICD-10-CM | POA: Diagnosis not present

## 2023-10-27 MED ORDER — DEXAMETHASONE 1 MG PO TABS: 1.0000 mg | ORAL_TABLET | Freq: Every evening | ORAL | 0 refills | Status: DC

## 2023-10-27 NOTE — Progress Notes (Signed)
   SUBJECTIVE:   CHIEF COMPLAINT / HPI:  Lindsey Payne is a 73 y.o. female with a pertinent past medical history of CAD, HTN, HFpEF, tubular adenoma of colon, tobacco use disorder presenting to the clinic for follow up of incidental adrenal adenoma on screening low-dose CT.  Adrenal adenoma No family history of malignancy, but has had tubular adenoma of colon. Has not noticed any palpitations, racing heartbeat, increased diaphoresis, increased anxiety. Not increasingly jittery or restless. No recent weight gain or weight loss.  Tobacco use Has been able to quit for 1 day prior to visit per discussion with Dr. Koval, follow up by phone for further cessation efforts.   PERTINENT PMH / PSH: CAD, HTN, HFpEF, tubular adenoma of colon, tobacco use disorder No family history of malignancy.   OBJECTIVE:   BP 118/70   Pulse 78   Ht 5\' 8"  (1.727 m)   Wt 203 lb (92.1 kg)   SpO2 98%   BMI 30.87 kg/m   General: Age-appropriate, resting comfortably in chair, NAD, alert and at baseline. Cardiovascular: Regular rate and rhythm. Normal S1/S2. No murmurs, rubs, or gallops appreciated. 2+ radial pulses. Abdominal: No tenderness to deep or light palpation. No rebound or guarding. No HSM.  No masses palpable. Skin: Warm and dry. Extremities: No peripheral edema bilaterally. Capillary refill <2 seconds.   ASSESSMENT/PLAN:   Assessment & Plan Adrenal adenoma, left Incidental left adrenal adenoma found on low-dose CT scan 3/13.  Suspect benign lipid poor adenoma, will pursue further workup.  Will perform dexamethasone suppression test Sunday night and obtain fasting AM labs first thing Monday morning. Reassuringly, blood pressure is well-controlled on current regimen, making adrenal malignancy less likely.  Renin/aldosterone ratio interpretation may be complicated by spironolactone use, but will use clinical judgement with interpretation and low threshold to hold spiro, refer to  Endo.  Metanephrines only necessary if imaging shows lipid rich adenoma. - Dexamethasone 1 mg PO provided, instructed patient to take between 11 PM and midnight Sunday 4/24 dexamethasone suppression test - Follow up lab appointment scheduled for 8:30 AM on Monday 4/21 - Fasting AM labs ordered for future: BMP, cortisol, DHEA-S, androstanedione, 17-hydroxyprogesterone, renin/aldosterone ratio (RAR) - Adrenal CT as previously scheduled for 4/23  Lindsey Mull Lansing Planas, MD [facility]

## 2023-10-27 NOTE — Patient Instructions (Signed)
 It was great to see you today! Thank you for choosing [facility] for your primary care.  Today we addressed: 1. Adrenal adenoma, left Please take your dexamethasone pill as close to 11 pm on Sunday night as possible.  Please do not eat anything from midnight Sunday to 8 am Monday.  Please return to clinic on Monday at 8:30 AM for labs.  You will get a MyChart message or a letter if results are normal. Otherwise, you will get a call from us .  Thank you for coming to see us  at [facility] and for the opportunity to care for you! Licia Harl, MD 10/27/2023, 9:34 AM

## 2023-10-27 NOTE — Assessment & Plan Note (Signed)
 Incidental left adrenal adenoma found on low-dose CT scan 3/13.  Suspect benign lipid poor adenoma, will pursue further workup.  Will perform dexamethasone suppression test Sunday night and obtain fasting AM labs first thing Monday morning. Reassuringly, blood pressure is well-controlled on current regimen, making adrenal malignancy less likely.  Renin/aldosterone ratio interpretation may be complicated by spironolactone use, but will use clinical judgement with interpretation and low threshold to hold spiro, refer to Endo.  Metanephrines only necessary if imaging shows lipid rich adenoma. - Dexamethasone 1 mg PO provided, instructed patient to take between 11 PM and midnight Sunday 4/24 dexamethasone suppression test - Follow up lab appointment scheduled for 8:30 AM on Monday 4/21 - Fasting AM labs ordered for future: BMP, cortisol, DHEA-S, androstanedione, 17-hydroxyprogesterone, renin/aldosterone ratio (RAR) - Adrenal CT as previously scheduled for 4/23

## 2023-10-31 ENCOUNTER — Ambulatory Visit (INDEPENDENT_AMBULATORY_CARE_PROVIDER_SITE_OTHER): Admitting: Family Medicine

## 2023-10-31 DIAGNOSIS — D3502 Benign neoplasm of left adrenal gland: Secondary | ICD-10-CM

## 2023-11-02 ENCOUNTER — Ambulatory Visit (HOSPITAL_COMMUNITY)
Admission: RE | Admit: 2023-11-02 | Discharge: 2023-11-02 | Disposition: A | Discharge status: Home / Self Care | Source: Ambulatory Visit | Attending: Family Medicine | Admitting: Family Medicine

## 2023-11-02 DIAGNOSIS — D3501 Benign neoplasm of right adrenal gland: Secondary | ICD-10-CM | POA: Diagnosis not present

## 2023-11-02 DIAGNOSIS — K7689 Other specified diseases of liver: Secondary | ICD-10-CM | POA: Diagnosis not present

## 2023-11-02 DIAGNOSIS — D3502 Benign neoplasm of left adrenal gland: Secondary | ICD-10-CM | POA: Insufficient documentation

## 2023-11-02 DIAGNOSIS — K5730 Diverticulosis of large intestine without perforation or abscess without bleeding: Secondary | ICD-10-CM | POA: Diagnosis not present

## 2023-11-06 LAB — BASIC METABOLIC PANEL WITH GFR
BUN/Creatinine Ratio: 12 (ref 12–28)
BUN: 15 mg/dL (ref 8–27)
CO2: 20 mmol/L (ref 20–29)
Calcium: 9.8 mg/dL (ref 8.7–10.3)
Chloride: 104 mmol/L (ref 96–106)
Creatinine, Ser: 1.29 mg/dL — ABNORMAL HIGH (ref 0.57–1.00)
Glucose: 85 mg/dL (ref 70–99)
Potassium: 5.2 mmol/L (ref 3.5–5.2)
Sodium: 140 mmol/L (ref 134–144)
eGFR: 44 mL/min/{1.73_m2} — ABNORMAL LOW (ref >59)

## 2023-11-06 LAB — ANDROSTENEDIONE: Androstenedione LCMS: 41 ng/dL (ref 17–99)

## 2023-11-06 LAB — ALDOSTERONE + RENIN ACTIVITY W/ RATIO
Aldos/Renin Ratio: 1.3 (ref 0.0–30.0)
Aldosterone: 42.8 ng/dL — ABNORMAL HIGH (ref 0.0–30.0)
Renin Activity, Plasma: 33.915 ng/mL/h — ABNORMAL HIGH (ref 0.167–5.380)

## 2023-11-06 LAB — DHEA-SULFATE: DHEA-SO4: 22.5 ug/dL (ref 20.4–186.6)

## 2023-11-06 LAB — 17-HYDROXYPROGESTERONE: 17-OH Progesterone LCMS: 11 ng/dL

## 2023-11-06 LAB — CORTISOL: Cortisol: 5.5 ug/dL — ABNORMAL LOW (ref 6.2–19.4)

## 2023-11-08 NOTE — Progress Notes (Signed)
 Patient presented for lab visit to obtain dexamethasone  suppression test labs.  Visit erroneously scheduled as office visit, will No Charge.  Following up labs as they result.

## 2023-11-09 ENCOUNTER — Telehealth: Payer: Self-pay | Admitting: Family Medicine

## 2023-11-09 DIAGNOSIS — D3502 Benign neoplasm of left adrenal gland: Secondary | ICD-10-CM

## 2023-11-09 NOTE — Telephone Encounter (Signed)
 Low dose dexamethasone  suppression test for incidentaloma showing: -Cortisol inadequately suppressed at >1.8 mcg/dL -DHEA-s 16.1 mcg/dL -Androstenedione and [id number] overall WNL -BMP with normal K, glucose, and stable Cr -Elevated aldosterone and renin, but normal ratio and likely elevated in setting of spironolactone  use, complicates interpretation mildly  Adrenal CT with contrast still pending read.  DHEA-s with ratio of 0.15 using lower limit of reference range for age and sex (<145 mcg/dL), which is suggestive of subclinical Cushing syndrome.  Cortisol consistent with ACTH-independent autonomous cortisol production. -Referring to Endo for further workup -Patient knows to reach out to [facility] for phone number if she does not hear from Endo within 3 weeks -Further workup may depend on goals of care for patient given advanced age of 89 -Follow up with Endo may involve 24 hour urinary free cortisol, serum ACTH, and high-dose overnight DS

## 2023-11-11 ENCOUNTER — Encounter: Payer: Self-pay | Admitting: Family Medicine

## 2023-11-30 ENCOUNTER — Telehealth: Payer: Self-pay | Admitting: Pharmacist

## 2023-11-30 DIAGNOSIS — F17200 Nicotine dependence, unspecified, uncomplicated: Secondary | ICD-10-CM

## 2023-11-30 NOTE — Assessment & Plan Note (Signed)
 Patient contacted for follow/up of tobacco cessation attempt.   Since last contact patient reports she has abstained from smoking for~1 month.   Medications currently being used;  NONE No longer using nicotine  patches - removed from med list.   Patient denies any significant side effects from tobacco cessation therapy.   Rates CONFIDENCE in remaining quit from tobacco as 10/10

## 2023-11-30 NOTE — Telephone Encounter (Signed)
 Patient contacted for follow/up of tobacco cessation attempt.   Since last contact patient reports she has abstained from smoking for~1 month.   Medications currently being used;  NONE No longer using nicotine  patches - removed from med list.   Patient denies any significant side effects from tobacco cessation therapy.   Rates CONFIDENCE in remaining quit from tobacco as 10/10  Motivation to quit: Breathing / Help with Health  Total time with patient call and documentation of interaction: 11 minutes.  Patient reports she has NOT heard from endocrinology referral.  She asked for help with update on status of referral.  I agreed to share with referral coordinator and PCP.   Follow-up phone call planned: 2 months.

## 2023-11-30 NOTE — Telephone Encounter (Signed)
-----   Message from Cristal Don sent at 10/24/2023  4:04 PM EDT ----- Regarding: Quit ? Down to 1/2 cig per day on 4/14

## 2023-12-01 NOTE — Telephone Encounter (Signed)
 Reviewed and agree with Dr Macky Lower plan.

## 2023-12-16 ENCOUNTER — Encounter: Payer: Self-pay | Admitting: Internal Medicine

## 2023-12-16 ENCOUNTER — Ambulatory Visit (INDEPENDENT_AMBULATORY_CARE_PROVIDER_SITE_OTHER): Admitting: Internal Medicine

## 2023-12-16 VITALS — BP 122/60 | HR 67 | Resp 20 | Ht 68.0 in | Wt 201.0 lb

## 2023-12-16 DIAGNOSIS — D3502 Benign neoplasm of left adrenal gland: Secondary | ICD-10-CM

## 2023-12-16 DIAGNOSIS — D3501 Benign neoplasm of right adrenal gland: Secondary | ICD-10-CM

## 2023-12-16 NOTE — Patient Instructions (Signed)
 SALIVARY CORTISOL COLLECTION INSTRUCTIONS    Precautions:  Please collect sample at Midnight You will need to do this on 2 nights  No food or fluids 30 minutes prior to collection.  Do not use any creams, lotions on hands, or use steroid inhalers 24- hours prior to collection.  Wash hands carefully.  Avoid any activity that could cause your gums to bleed: including flushing of brushing your teeth.  Kit must not be used in children less than 74 years of age, or a person that is at risk for choking on collection kit.  Instructions for saliva collection:   Rinse mouth thoroughly with water and discard. Do not swallow.  Hold the Salivette at the rim of the suspended insert and remove the stopper.  Remove the swab.  Place swab under tongue until well saturated, approximately 1 minute.   Return the saturated swab to the suspended insert and close the Salivette firmly with the stopper.  Do not remove the tube holding the insert. The Salivette should be sent to the lab with the swab.   Come to the lab and leave the Salivette kit for labeling with your identifying information.   Make sure you refrigerate sample if not bringing to the lab immediately. Try to use cold packs for transportation if available.

## 2023-12-16 NOTE — Progress Notes (Signed)
 Name: Lindsey Payne  MRN/ DOB: [id number], Feb 21, 1951    Age/ Sex: 73 y.o., female    PCP: Naida Austria, MD   Reason for Endocrinology Evaluation: Adrenal adenoma     Date of Initial Endocrinology Evaluation: 12/16/2023     HPI: Lindsey Payne is a 73 y.o. female with a past medical history of HTN. The patient presented for initial endocrinology clinic visit on 12/16/2023 for consultative assistance with her adrenal adenoma.   Patient was noted with bilateral adrenal adenoma on CT imaging 10/2023.  There is a 2.9 cm left adrenal adenoma as well as 1.5 cm right adrenal adenoma.   In reviewing her chart, patient has been noted with normal Aldo/renin ratio 1.3 , aldosterone elevated at 42.8, as well as elevated renin at 33.915  ng/mL/hr, DHEA-S normal 22.5 Cortisol low 5.5 UG/DL  Patient also was noted with the dexamethasone  1 mg tablet prescribed on 10/27/2023 and it appears that a serum cortisol of 5.5 was following dexamethasone  suppression test   She was diagnosed with HTN at age 25 Son with HTN and parents   Has hx of back and knee sx, years ago   Adrenal incidentaloma: Substantial weight gain- weight has been fluctuating  Severe hypertension- no DM- no Proximal muscle weakness-yes Anxiety attacks- yes Cardiac arrhythmias-no but has CAD Palpitations- no Fluid retention- no, she is on furosemide  and spironolactone   Hypokalemia-once in 2015, she was on furosemide  at the time.  Patient on potassium supplements   Patient already on spironolactone  25 mg, which she has been on for years  Denies headaches  Denies vision changes  No changes in bowel movements Denies nausea or vomiting    Bedtime 7-7:30 PM       HISTORY:  Past Medical History:  Past Medical History:  Diagnosis Date   Adenoma of large intestine    Anxiety    Arthritis    right hand    Asthma    Back pain    Cataract    CHF (congestive heart failure) (HCC)     Coronary artery disease    Depression    Ganglion cyst    rt wrist   Heart failure with reduced ejection fraction (HCC) 08/15/2019   Hemorrhoids    Hyperlipidemia    Hypertension    Loss of teeth due to extraction 09/29/2016   Lung collapse 1984   left lung, no problems since then   Prediabetes 03/10/2022   Rectal bleeding 01/30/2013   Right shoulder pain 11/26/2020   Sickle cell anemia (HCC)    trait only   Transfusion history    Tubular adenoma    Wears glasses    Past Surgical History:  Past Surgical History:  Procedure Laterality Date   ABDOMINAL HYSTERECTOMY     has one ovary left   BACK SURGERY     3 disc removed   CESAREAN SECTION     1983   COLON SURGERY  1987   benign tubulovillous adenoma   COLONOSCOPY N/A 07/22/2017   Procedure: COLONOSCOPY;  Surgeon: Juanita Norlander, MD;  Location: [facility];  Service: General;  Laterality: N/A;  NO ANESTHESIA PER DR. Odean Bend   COLONOSCOPY  10/2020   MS-MAC-poor prep-TA=repeat in 3 months due to poor prep   CORONARY ANGIOPLASTY  2005   w 2 stents   GANGLION CYST EXCISION Right 10/07/2016   Procedure: Right wrist volar ganglion excision;  Surgeon: Arvil Birks, MD;  Location: [facility];  Service:  Orthopedics;  Laterality: Right;   Heart stints     Dr. Anastasia Balo   LUNG SURGERY Left    for collasped lung- in the 1980s   POLYPECTOMY  10/2020   TA x 1   TONSILLECTOMY     TOTAL HIP ARTHROPLASTY Right 05/13/2015   Procedure: RIGHT TOTAL HIP ARTHROPLASTY ANTERIOR APPROACH;  Surgeon: Arnie Lao, MD;  Location: [facility] OR;  Service: Orthopedics;  Laterality: Right;    Social History:  reports that she has been smoking cigarettes. She started smoking about 65 years ago. She has a 16.4 pack-year smoking history. She has never used smokeless tobacco. She reports current alcohol  use of about 1.0 standard drink of alcohol  per week. She reports that she does not use drugs. Family History: family history includes  Breast cancer in her mother; Breast cancer (age of onset: 57) in her sister; Cancer in her father and mother; Cancer (age of onset: 61) in her sister; Colon cancer (age of onset: 53) in her father; Colon polyps (age of onset: 45) in her father; Diabetes in her father; Heart disease in her father, maternal grandfather, and mother; Hypertension in her son; Stroke in her maternal grandmother.   HOME MEDICATIONS: Allergies as of 12/16/2023       Reactions   Valium  [diazepam ] Anxiety        Medication List        Accurate as of December 16, 2023  7:39 AM. If you have any questions, ask your nurse or doctor.          aspirin  EC 81 MG tablet Take 1 tablet (81 mg total) by mouth daily.   dexamethasone  1 MG tablet Commonly known as: DECADRON  Take 1 tablet (1 mg total) by mouth at bedtime. Please take as close to 11 pm as possible.   diclofenac  Sodium 1 % Gel Commonly known as: Voltaren  Apply 2 g topically 4 (four) times daily as needed (pain).   furosemide  20 MG tablet Commonly known as: LASIX  TAKE 1 TABLET BY MOUTH TWICE  DAILY   hydrALAZINE  50 MG tablet Commonly known as: APRESOLINE  TAKE 1 TABLET BY MOUTH TWICE  DAILY   isosorbide  dinitrate 20 MG tablet Commonly known as: ISORDIL  TAKE 1 TABLET BY MOUTH TWICE  DAILY   lisinopril  40 MG tablet Commonly known as: ZESTRIL  TAKE 1 TABLET BY MOUTH DAILY   metoprolol  succinate 25 MG 24 hr tablet Commonly known as: TOPROL -XL Take 1 tablet (25 mg total) by mouth 2 (two) times daily.   nitroGLYCERIN  0.4 MG SL tablet Commonly known as: NITROSTAT  Place 1 tablet (0.4 mg total) under the tongue every 5 (five) minutes as needed.   PARoxetine  40 MG tablet Commonly known as: PAXIL  TAKE 1 TABLET BY MOUTH IN THE  MORNING   potassium chloride  SA 20 MEQ tablet Commonly known as: KLOR-CON  M Take 1 tablet (20 mEq total) by mouth 2 (two) times daily.   rosuvastatin  20 MG tablet Commonly known as: CRESTOR  Take 1 tablet (20 mg total) by  mouth daily.   spironolactone  25 MG tablet Commonly known as: ALDACTONE  Take 1 tablet (25 mg total) by mouth daily.          REVIEW OF SYSTEMS: A comprehensive ROS was conducted with the patient and is negative except as per HPI    OBJECTIVE:  VS: BP 122/60   Pulse 67   Resp 20   Ht 5\' 8"  (1.727 m)   Wt 201 lb (91.2 kg)   SpO2 99%  BMI 30.56 kg/m    Wt Readings from Last 3 Encounters:  12/16/23 201 lb (91.2 kg)  10/27/23 203 lb (92.1 kg)  09/07/23 206 lb 6.4 oz (93.6 kg)     EXAM: General: Pt appears well and is in NAD  Neck: General: Supple without adenopathy. Thyroid : Thyroid  size normal.  No goiter or nodules appreciated.   Lungs: Clear with good BS bilat   Heart: Auscultation: RRR.  Abdomen: Soft, nontender  Extremities:  BL LE: No pretibial edema   Mental Status: Judgment, insight: Intact Orientation: Oriented to time, place, and person Mood and affect: No depression, anxiety, or agitation     DATA REVIEWED:   Latest Reference Range & Units 10/31/23 08:56  ALDOSTERONE 0.0 - 30.0 ng/dL 16.1 (H)  Cortisol 6.2 - 19.4 ug/dL 5.5 (L)  DHEA-SO4 09.6 - 186.6 ug/dL 04.5  ALDOS/RENIN RATIO 0.0 - 30.0  1.3    Latest Reference Range & Units 10/31/23 08:56  17-OH Progesterone LCMS ng/dL 11  Androstenedione LCMS 17 - 99 ng/dL 41       CT adrenal abdomen without contrast 11/02/2023   There is a 2.9 cm left adrenal adenoma as well as a 1.5 cm right adrenal adenoma.  Old records , labs and images have been reviewed.   ASSESSMENT/PLAN/RECOMMENDATIONS:   Bilateral adrenal adenomas  -Right adenoma 1.5 cm, left adenoma 2.9 cm. < 10 HFU, mostly benign.  No serial imaging needed at this time - We discussed the pathophysiology of adrenal adenomas  - Eighty five percent of adrenal adenomas are nonsecretory.  - Three forms of adrenal hyperfunction should be considered in patients with adrenal incidentaloma  Glucocorticoid hypersecretion Primary  hyperaldosteronism Pheochromocytoma ion - The patient had elevated renin, while on spironolactone , BP is controlled at this time, we did discuss that spironolactone  is medical management for hyperaldosteronism, patient states she is 73 years old and would like to keep things simple, we will avoid invasive testing as long as it is not necessary at this time - She did have elevated dexamethasone  suppression test with a serum cortisol of 5.5.  I will proceed with saliva cortisol, emphasized the importance of doing this exactly at midnight on 2 separate nights - Unable to proceed with 24-hour testing as the patient is on furosemide  and spironolactone  which will skew her urinary testing results - Plasma catecholamines pending   Follow-up in 6 months  Signed electronically by: Natale Bail, MD  [facility]  [facility] [address] [place] [place], [place] [address] Phone: [phone] FAX: [phone]   CC: Naida Austria, MD [address] [place] [address] Phone: [phone] Fax: [phone]   Return to Endocrinology clinic as below: Future Appointments  Date Time Provider Department Center  02/13/2024  9:10 AM [facility] ANNUAL WELLNESS VISIT [facility]

## 2023-12-19 ENCOUNTER — Other Ambulatory Visit: Payer: Self-pay

## 2023-12-19 ENCOUNTER — Other Ambulatory Visit

## 2023-12-19 DIAGNOSIS — D3502 Benign neoplasm of left adrenal gland: Secondary | ICD-10-CM | POA: Diagnosis not present

## 2023-12-19 DIAGNOSIS — D3501 Benign neoplasm of right adrenal gland: Secondary | ICD-10-CM | POA: Diagnosis not present

## 2023-12-25 LAB — CATECHOLAMINES, FRACTIONATED, PLASMA
Dopamine: 65 pg/mL — ABNORMAL HIGH
Epinephrine: 45 pg/mL
Norepinephrine: 1119 pg/mL — ABNORMAL HIGH
Total Catecholamines: 1229 pg/mL — ABNORMAL HIGH

## 2023-12-28 ENCOUNTER — Ambulatory Visit: Payer: Self-pay | Admitting: Internal Medicine

## 2023-12-28 DIAGNOSIS — D3501 Benign neoplasm of right adrenal gland: Secondary | ICD-10-CM

## 2023-12-28 NOTE — Telephone Encounter (Signed)
 Please let the patient know that her hormone levels are high, and this is most likely due to the adrenal tumors.    Before proceeding any further, I would like to make sure that there is no error and I would like to repeat this test.   Please advise the patient to avoid caffeine 24 hours before she comes in for her lab appointment   The patient will need to be laid down for 30 minutes before drawing her labs   Please schedule her for repeat labs   Thanks

## 2024-01-02 ENCOUNTER — Other Ambulatory Visit

## 2024-01-02 DIAGNOSIS — D3502 Benign neoplasm of left adrenal gland: Secondary | ICD-10-CM | POA: Diagnosis not present

## 2024-01-02 DIAGNOSIS — D3501 Benign neoplasm of right adrenal gland: Secondary | ICD-10-CM | POA: Diagnosis not present

## 2024-01-03 LAB — CORTISOL, LC/MS, SALIVA, 2 SAMPLES
Cortisol Savliva Sample: 0.06 ug/dL
Cortisol, Saliva Sample: 0.04 ug/dL

## 2024-01-10 LAB — CATECHOLAMINES, FRACTIONATED, PLASMA
Dopamine: 28 pg/mL — ABNORMAL HIGH
Epinephrine: 32 pg/mL
Norepinephrine: 558 pg/mL
Total Catecholamines: 618 pg/mL

## 2024-01-21 ENCOUNTER — Other Ambulatory Visit (HOSPITAL_BASED_OUTPATIENT_CLINIC_OR_DEPARTMENT_OTHER): Payer: Self-pay | Admitting: Cardiology

## 2024-01-21 DIAGNOSIS — I10 Essential (primary) hypertension: Secondary | ICD-10-CM

## 2024-02-02 ENCOUNTER — Telehealth: Payer: Self-pay | Admitting: Pharmacist

## 2024-02-02 DIAGNOSIS — Z87891 Personal history of nicotine dependence: Secondary | ICD-10-CM

## 2024-02-02 NOTE — Telephone Encounter (Signed)
 Patient contacted for follow/up of tobacco cessation attempt.   Since last contact patient reports she has remained smoke free and has NOW quit smoking for > 3 months.   Medications currently being used; None -   Patient denies any significant side effects from tobacco cessation therapy.   Continues to rate CONFIDENCE of quitting tobacco as high.  Encouraged continued abstinence and discussed caution with future stressful life events which can trigger smoking again.   Total time with patient call and documentation of interaction: 11 minutes. Follow-up phone call planned: at 6 months quit

## 2024-02-02 NOTE — Assessment & Plan Note (Signed)
 Patient contacted for follow/up of tobacco cessation attempt.   Since last contact patient reports she has remained smoke free and has NOW quit smoking for > 3 months.   Medications currently being used; None -   Patient denies any significant side effects from tobacco cessation therapy.   Continues to rate CONFIDENCE of quitting tobacco as high.  Encouraged continued abstinence and discussed caution with future stressful life events which can trigger smoking again.   Total time with patient call and documentation of interaction: 11 minutes. Follow-up phone call planned: at 6 months quit

## 2024-02-02 NOTE — Telephone Encounter (Signed)
-----   Message from Maude Lagos sent at 11/30/2023  3:15 PM EDT ----- Regarding: Quit since mid April 2025 3 months quit?

## 2024-02-02 NOTE — Telephone Encounter (Signed)
 Reviewed and agree with Dr Macky Lower plan.

## 2024-02-07 ENCOUNTER — Ambulatory Visit: Admitting: Internal Medicine

## 2024-02-13 ENCOUNTER — Ambulatory Visit: Payer: 59

## 2024-02-13 VITALS — Ht 68.0 in | Wt 203.0 lb

## 2024-02-13 DIAGNOSIS — Z0000 Encounter for general adult medical examination without abnormal findings: Secondary | ICD-10-CM

## 2024-02-13 NOTE — Progress Notes (Signed)
 Because this visit was a virtual/telehealth visit,  certain criteria was not obtained, such a blood pressure, CBG if applicable, and timed get up and go. Any medications not marked as taking were not mentioned during the medication reconciliation part of the visit. Any vitals not documented were not able to be obtained due to this being a telehealth visit or patient was unable to self-report a recent blood pressure reading due to a lack of equipment at home via telehealth. Vitals that have been documented are verbally provided by the patient.   Subjective:   Lindsey Payne is a 73 y.o. who presents for a Medicare Wellness preventive visit.  As a reminder, Annual Wellness Visits don't include a physical exam, and some assessments may be limited, especially if this visit is performed virtually. We may recommend an in-person follow-up visit with your provider if needed.  Visit Complete: Virtual I connected with  Link Osie Remington on 02/13/24 by a audio enabled telemedicine application and verified that I am speaking with the correct person using two identifiers.  Patient Location: Home  Provider Location: Home Office  I discussed the limitations of evaluation and management by telemedicine. The patient expressed understanding and agreed to proceed.  Vital Signs: Because this visit was a virtual/telehealth visit, some criteria may be missing or patient reported. Any vitals not documented were not able to be obtained and vitals that have been documented are patient reported.  VideoDeclined- This patient declined Librarian, academic. Therefore the visit was completed with audio only.  Persons Participating in Visit: Patient.  AWV Questionnaire: No: Patient Medicare AWV questionnaire was not completed prior to this visit.  Cardiac Risk Factors include: advanced age (>54men, >33 women);dyslipidemia;hypertension;obesity (BMI >30kg/m2);smoking/  tobacco exposure;family history of premature cardiovascular disease     Objective:    Today's Vitals   02/13/24 0913  Weight: 203 lb (92.1 kg)  Height: 5' 8 (1.727 m)  PainSc: 8   PainLoc: Back   Body mass index is 30.87 kg/m.     02/13/2024    9:17 AM 10/27/2023    8:55 AM 06/17/2023    8:53 AM 02/07/2023    3:19 PM 09/27/2022    9:51 AM 04/06/2022    2:59 PM 03/10/2022   10:10 AM  Advanced Directives  Does Patient Have a Medical Advance Directive? Yes No No No No No No  Type of Estate agent of [place];Living will        Copy of Healthcare Power of Attorney in Chart? No - copy requested        Would patient like information on creating a medical advance directive?  No - Patient declined No - Patient declined Yes (MAU/Ambulatory/Procedural Areas - Information given) No - Patient declined No - Patient declined No - Patient declined    Current Medications (verified) Outpatient Encounter Medications as of 02/13/2024  Medication Sig   aspirin  EC 81 MG tablet Take 1 tablet (81 mg total) by mouth daily.   diclofenac  Sodium (VOLTAREN ) 1 % GEL Apply 2 g topically 4 (four) times daily as needed (pain).   furosemide  (LASIX ) 20 MG tablet TAKE 1 TABLET BY MOUTH TWICE  DAILY   hydrALAZINE  (APRESOLINE ) 50 MG tablet TAKE 1 TABLET BY MOUTH TWICE  DAILY   isosorbide  dinitrate (ISORDIL ) 20 MG tablet TAKE 1 TABLET BY MOUTH TWICE  DAILY   lisinopril  (ZESTRIL ) 40 MG tablet TAKE 1 TABLET BY MOUTH DAILY   metoprolol  succinate (TOPROL -XL) 25 MG 24  hr tablet Take 1 tablet (25 mg total) by mouth 2 (two) times daily.   nitroGLYCERIN  (NITROSTAT ) 0.4 MG SL tablet Place 1 tablet (0.4 mg total) under the tongue every 5 (five) minutes as needed.   PARoxetine  (PAXIL ) 40 MG tablet TAKE 1 TABLET BY MOUTH IN THE  MORNING   potassium chloride  SA (KLOR-CON  M) 20 MEQ tablet Take 1 tablet (20 mEq total) by mouth 2 (two) times daily.   rosuvastatin  (CRESTOR ) 20 MG tablet Take 1 tablet (20 mg total)  by mouth daily.   spironolactone  (ALDACTONE ) 25 MG tablet Take 1 tablet (25 mg total) by mouth daily.   No facility-administered encounter medications on file as of 02/13/2024.    Allergies (verified) Valium  [diazepam ]   History: Past Medical History:  Diagnosis Date   Adenoma of large intestine    Anxiety    Arthritis    right hand    Asthma    Back pain    Cataract    CHF (congestive heart failure) (HCC)    Coronary artery disease    Depression    Ganglion cyst    rt wrist   Heart failure with reduced ejection fraction (HCC) 08/15/2019   Hemorrhoids    Hyperlipidemia    Hypertension    Loss of teeth due to extraction 09/29/2016   Lung collapse 1984   left lung, no problems since then   Prediabetes 03/10/2022   Rectal bleeding 01/30/2013   Right shoulder pain 11/26/2020   Sickle cell anemia (HCC)    trait only   Transfusion history    Tubular adenoma    Wears glasses    Past Surgical History:  Procedure Laterality Date   ABDOMINAL HYSTERECTOMY     has one ovary left   BACK SURGERY     3 disc removed   CESAREAN SECTION     1983   COLON SURGERY  1987   benign tubulovillous adenoma   COLONOSCOPY N/A 07/22/2017   Procedure: COLONOSCOPY;  Surgeon: Ethyl Lenis, MD;  Location: [facility];  Service: General;  Laterality: N/A;  NO ANESTHESIA PER DR. ETHYL   COLONOSCOPY  10/2020   MS-MAC-poor prep-TA=repeat in 3 months due to poor prep   CORONARY ANGIOPLASTY  2005   w 2 stents   GANGLION CYST EXCISION Right 10/07/2016   Procedure: Right wrist volar ganglion excision;  Surgeon: Prentice Pagan, MD;  Location: [facility] [place];  Service: Orthopedics;  Laterality: Right;   Heart stints     Dr. Blanca   LUNG SURGERY Left    for collasped lung- in the 1980s   POLYPECTOMY  10/2020   TA x 1   TONSILLECTOMY     TOTAL HIP ARTHROPLASTY Right 05/13/2015   Procedure: RIGHT TOTAL HIP ARTHROPLASTY ANTERIOR APPROACH;  Surgeon: Lonni CINDERELLA Poli, MD;  Location:  [facility] OR;  Service: Orthopedics;  Laterality: Right;   Family History  Problem Relation Age of Onset   Cancer Mother    Breast cancer Mother    Heart disease Mother    Cancer Father    Colon polyps Father 53   Colon cancer Father 62   Diabetes Father    Heart disease Father    Breast cancer Sister 9   Cancer Sister 30       Breast   Stroke Maternal Grandmother    Heart disease Maternal Grandfather    Hypertension Son    Esophageal cancer Neg Hx    Stomach cancer Neg Hx  Rectal cancer Neg Hx    Social History   Socioeconomic History   Marital status: Widowed    Spouse name: Not on file   Number of children: 1   Years of education: 12   Highest education level: High school graduate  Occupational History   Occupation: disabled    Comment: Designer, 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  Tobacco Use   Smoking status: Every Day    Current packs/day: 0.25    Average packs/day: 0.3 packs/day for 65.6 years (16.4 ttl pk-yrs)    Types: Cigarettes    Start date: 07/12/1958   Smokeless tobacco: Never   Tobacco comments:    smoking 0-3 cigs per day  Vaping Use   Vaping status: Never Used  Substance and Sexual Activity   Alcohol  use: Yes    Alcohol /week: 1.0 standard drink of alcohol     Types: 1 Cans of beer per week   Drug use: No   Sexual activity: Not Currently    Birth control/protection: Surgical  Other Topics Concern   Not on file  Social History Narrative   Patient lives alone with her dog Chance.      Patient is very close with her Son Willapa. Patients son takes care of her finances for her, and has recently paid off her home and bought her a car.       Enjoys spending time with her son and her dog Chance.    Social Drivers of Corporate investment banker Strain: Low Risk  (02/13/2024)   Overall Financial Resource Strain (CARDIA)    Difficulty of Paying Living Expenses: Not hard at all  Food Insecurity: No Food Insecurity (02/13/2024)   Hunger Vital Sign    Worried About Running  Out of Food in the Last Year: Never true    Ran Out of Food in the Last Year: Never true  Transportation Needs: No Transportation Needs (02/13/2024)   PRAPARE - Administrator, Civil Service (Medical): No    Lack of Transportation (Non-Medical): No  Physical Activity: Sufficiently Active (02/13/2024)   Exercise Vital Sign    Days of Exercise per Week: 5 days    Minutes of Exercise per Session: 30 min  Stress: No Stress Concern Present (02/13/2024)   [employer] of Occupational Health - Occupational Stress Questionnaire    Feeling of Stress: Not at all  Social Connections: Moderately Integrated (02/13/2024)   Social Connection and Isolation Panel    Frequency of Communication with Friends and Family: More than three times a week    Frequency of Social Gatherings with Friends and Family: Three times a week    Attends Religious Services: More than 4 times per year    Active Member of Clubs or Organizations: Yes    Attends Banker Meetings: 1 to 4 times per year    Marital Status: Widowed    Tobacco Counseling Ready to quit: Not Answered Counseling given: Not Answered Tobacco comments: smoking 0-3 cigs per day    Clinical Intake:  Pre-visit preparation completed: Yes  Pain : 0-10 Pain Score: 8  Pain Type: Acute pain, Chronic pain, Neuropathic pain     Nutritional Risks: None Diabetes: No  Lab Results  Component Value Date   HGBA1C 5.6 06/17/2023   HGBA1C 5.8 (A) 04/06/2022   HGBA1C 5.6 02/01/2018     How often do you need to have someone help you when you read instructions, pamphlets, or other written materials from your doctor or pharmacy?:  1 - Never What is the last grade level you completed in school?: HSG  Interpreter Needed?: No  Information entered by :: Roz Fuller, LPN.   Activities of Daily Living     02/13/2024    9:18 AM  In your present state of health, do you have any difficulty performing the following activities:   Hearing? 0  Vision? 0  Difficulty concentrating or making decisions? 0  Comment BSE: games on the phone  Walking or climbing stairs? 0  Dressing or bathing? 0  Doing errands, shopping? 0  Preparing Food and eating ? N  Using the Toilet? N  In the past six months, have you accidently leaked urine? N  Do you have problems with loss of bowel control? N  Managing your Medications? N  Managing your Finances? N  Housekeeping or managing your Housekeeping? N    Patient Care Team: Adele Song, MD as PCP - General Aneita Gwendlyn DASEN, MD (Inactive) as Consulting Physician (Gastroenterology) Silva Juliene SAUNDERS, DPM as Consulting Physician (Podiatry) Millsaps, Lonni RAMAN, DMD (Dentistry) Vannie Reche RAMAN, NP as Nurse Practitioner (Cardiology) [facility] as Consulting Physician (Ophthalmology)  I have updated your Care Teams any recent Medical Services you may have received from other providers in the past year.     Assessment:   This is a routine wellness examination for [place].  Hearing/Vision screen Hearing Screening - Comments:: Denies hearing difficulties.  Vision Screening - Comments:: Wears rx glasses - up to date with routine eye exams with [facility]    Goals Addressed             This Visit's Progress    02/13/24: To stop using my cane and get closer to God.         Depression Screen     02/13/2024    9:19 AM 10/27/2023    8:54 AM 09/07/2023    9:48 AM 08/15/2023    9:53 AM 06/17/2023    8:55 AM 02/07/2023    3:13 PM 09/27/2022    9:50 AM  PHQ 2/9 Scores  PHQ - 2 Score 0 2 2 0 4 0 0  PHQ- 9 Score 0 7 4 2 6   0    Fall Risk     02/13/2024    9:17 AM 10/27/2023    8:54 AM 08/15/2023    9:50 AM 02/07/2023    3:15 PM 09/27/2022    9:50 AM  Fall Risk   Falls in the past year? 0 0 0 0 0  Number falls in past yr: 0 0 0 0 0  Injury with Fall? 0 0 0 0 0  Risk for fall due to : No Fall Risks   No Fall Risks   Follow up Falls evaluation  completed  Falls evaluation completed Falls prevention discussed;Education provided;Falls evaluation completed     MEDICARE RISK AT HOME:  Medicare Risk at Home Any stairs in or around the home?: No If so, are there any without handrails?: No Home free of loose throw rugs in walkways, pet beds, electrical cords, etc?: Yes Adequate lighting in your home to reduce risk of falls?: Yes Life alert?: No Use of a cane, walker or w/c?: Yes Grab bars in the bathroom?: Yes Shower chair or bench in shower?: Yes Elevated toilet seat or a handicapped toilet?: Yes  TIMED UP AND GO:  Was the test performed?  No  Cognitive Function: Declined/Normal: No cognitive concerns noted by patient or family. Patient alert,  oriented, able to answer questions appropriately and recall recent events. No signs of memory loss or confusion.    02/13/2024    9:19 AM  MMSE - Mini Mental State Exam  Not completed: Unable to complete        02/13/2024    9:25 AM 02/07/2023    3:19 PM 09/29/2021    1:50 PM 01/09/2019    9:01 AM  6CIT Screen  What Year? 0 points 0 points 0 points 0 points  What month? 0 points 0 points 0 points 0 points  What time? 0 points 0 points 0 points 0 points  Count back from 20 0 points 0 points 0 points 0 points  Months in reverse 0 points 0 points 4 points 4 points  Repeat phrase 0 points 0 points 2 points 0 points  Total Score 0 points 0 points 6 points 4 points    Immunizations Immunization History  Administered Date(s) Administered   Fluad Quad(high Dose 65+) 03/15/2023   Influenza Split 03/07/2012, 02/25/2020   Influenza Whole 05/03/2007, 04/17/2008, 05/21/2008, 03/25/2009   Influenza, High Dose Seasonal PF 03/18/2018, 02/28/2019   Influenza,inj,Quad PF,6+ Mos 03/09/2016, 04/11/2017, 07/22/2021   Influenza-Unspecified 03/17/2011, 04/06/2013, 04/11/2014, 05/11/2015   Moderna SARS-COV2 Booster Vaccination 03/23/2021   Moderna Sars-Covid-2 Vaccination 08/24/2019, 09/21/2019,  02/25/2020, 10/28/2020   PNEUMOCOCCAL CONJUGATE-20 03/10/2022   Pfizer(Comirnaty)Fall Seasonal Vaccine 12 years and older 03/15/2023   Pneumococcal Conjugate-13 07/08/2016   Pneumococcal Polysaccharide-23 07/27/2007, 05/15/2015   RSV,unspecified 05/13/2022   Td 07/12/2001   Tdap 05/01/2012, 11/05/2022   Unspecified SARS-COV-2 Vaccination 03/30/2022   Zoster Recombinant(Shingrix) 08/29/2020, 10/28/2020    Screening Tests Health Maintenance  Topic Date Due   COVID-19 Vaccine (8 - 2024-25 season) 09/12/2023   INFLUENZA VACCINE  02/10/2024   Medicare Annual Wellness (AWV)  02/12/2025   MAMMOGRAM  09/26/2025   Colonoscopy  01/29/2028   DTaP/Tdap/Td (4 - Td or Tdap) 11/04/2032   Pneumococcal Vaccine: 50+ Years  Completed   DEXA SCAN  Completed   Hepatitis C Screening  Completed   Zoster Vaccines- Shingrix  Completed   Hepatitis B Vaccines  Aged Out   HPV VACCINES  Aged Out   Meningococcal B Vaccine  Aged Out    Health Maintenance  Health Maintenance Due  Topic Date Due   COVID-19 Vaccine (8 - 2024-25 season) 09/12/2023   INFLUENZA VACCINE  02/10/2024   Health Maintenance Items Addressed: Yes Patient aware of current care gaps.  Immunization record was verified by [facility] and updated in patient's chart. Patient is due for flu and covid vaccine for Fall season.  Additional Screening:  Vision Screening: Recommended annual ophthalmology exams for early detection of glaucoma and other disorders of the eye. Would you like a referral to an eye doctor? No    Dental Screening: Recommended annual dental exams for proper oral hygiene  Community Resource Referral / Chronic Care Management: CRR required this visit?  No   CCM required this visit?  No   Plan:    I have personally reviewed and noted the following in the patient's chart:   Medical and social history Use of alcohol , tobacco or illicit drugs  Current medications and supplements including opioid prescriptions. Patient  is not currently taking opioid prescriptions. Functional ability and status Nutritional status Physical activity Advanced directives List of other physicians Hospitalizations, surgeries, and ER visits in previous 12 months Vitals Screenings to include cognitive, depression, and falls Referrals and appointments  In addition, I have reviewed and discussed with  patient certain preventive protocols, quality metrics, and best practice recommendations. A written personalized care plan for preventive services as well as general preventive health recommendations were provided to patient.   Roz LOISE Fuller, LPN   07/16/7972   After Visit Summary: (MyChart) Due to this being a telephonic visit, the after visit summary with patients personalized plan was offered to patient via MyChart   Notes: Patient aware of current care gaps.  Immunization record was verified by [facility] and updated in patient's chart. Patient is due for flu and covid vaccine for Fall season.

## 2024-02-13 NOTE — Patient Instructions (Signed)
 Lindsey Payne , Thank you for taking time out of your busy schedule to complete your Annual Wellness Visit with me. I enjoyed our conversation and look forward to speaking with you again next year. I, as well as your care team,  appreciate your ongoing commitment to your health goals. Please review the following plan we discussed and let me know if I can assist you in the future. Your Game plan/ To Do List    Referrals: If you haven't heard from the office you've been referred to, please reach out to them at the phone provided.   Follow up Visits: We will see or speak with you next year for your Next Medicare AWV with our clinical staff Have you seen your provider in the last 6 months (3 months if uncontrolled diabetes)? Yes  Clinician Recommendations:  Aim for 30 minutes of exercise or brisk walking, 6-8 glasses of water, and 5 servings of fruits and vegetables each day.       This is a list of the screenings recommended for you:  Health Maintenance  Topic Date Due   COVID-19 Vaccine (8 - 2024-25 season) 09/12/2023   Flu Shot  02/10/2024   Medicare Annual Wellness Visit  02/12/2025   Mammogram  09/26/2025   Colon Cancer Screening  01/29/2028   DTaP/Tdap/Td vaccine (4 - Td or Tdap) 11/04/2032   Pneumococcal Vaccine for age over 65  Completed   DEXA scan (bone density measurement)  Completed   Hepatitis C Screening  Completed   Zoster (Shingles) Vaccine  Completed   Hepatitis B Vaccine  Aged Out   HPV Vaccine  Aged Out   Meningitis B Vaccine  Aged Out    Advanced directives: (Copy Requested) Please bring a copy of your health care power of attorney and living will to the office to be added to your chart at your convenience. You can mail to [facility] [address] [place], [place] [address] or email to [email or web address] Advance Care Planning is important because it:  [x]  Makes sure you receive the medical care that is consistent with your values, goals, and  preferences  [x]  It provides guidance to your family and loved ones and reduces their decisional burden about whether or not they are making the right decisions based on your wishes.  Follow the link provided in your after visit summary or read over the paperwork we have mailed to you to help you started getting your Advance Directives in place. If you need assistance in completing these, please reach out to us  so that we can help you!  See attachments for Preventive Care and Fall Prevention Tips.

## 2024-02-17 ENCOUNTER — Other Ambulatory Visit: Payer: Self-pay | Admitting: Family Medicine

## 2024-02-17 DIAGNOSIS — E782 Mixed hyperlipidemia: Secondary | ICD-10-CM

## 2024-02-17 DIAGNOSIS — Z8679 Personal history of other diseases of the circulatory system: Secondary | ICD-10-CM

## 2024-02-17 DIAGNOSIS — I10 Essential (primary) hypertension: Secondary | ICD-10-CM

## 2024-03-02 ENCOUNTER — Encounter: Payer: Self-pay | Admitting: Family Medicine

## 2024-03-02 ENCOUNTER — Ambulatory Visit: Payer: Self-pay | Admitting: Family Medicine

## 2024-03-02 ENCOUNTER — Ambulatory Visit: Admitting: Family Medicine

## 2024-03-02 VITALS — BP 136/72 | HR 68 | Wt 205.0 lb

## 2024-03-02 DIAGNOSIS — Z87891 Personal history of nicotine dependence: Secondary | ICD-10-CM | POA: Diagnosis not present

## 2024-03-02 DIAGNOSIS — Z0000 Encounter for general adult medical examination without abnormal findings: Secondary | ICD-10-CM

## 2024-03-02 DIAGNOSIS — D3502 Benign neoplasm of left adrenal gland: Secondary | ICD-10-CM

## 2024-03-02 DIAGNOSIS — R7303 Prediabetes: Secondary | ICD-10-CM | POA: Diagnosis not present

## 2024-03-02 DIAGNOSIS — I10 Essential (primary) hypertension: Secondary | ICD-10-CM | POA: Diagnosis not present

## 2024-03-02 LAB — POCT GLYCOSYLATED HEMOGLOBIN (HGB A1C): Hemoglobin A1C: 5.6 % (ref 4.0–5.6)

## 2024-03-02 NOTE — Assessment & Plan Note (Addendum)
 Smoked 1 cigarette since last appointment after being scared by snake.  Cites exercise, staying busy as effective methods for ongoing tobacco cessation

## 2024-03-02 NOTE — Assessment & Plan Note (Addendum)
 Evaluated by endocrinology, normal workup.  Instructed to follow-up in 6 months, this will be around December 2025.

## 2024-03-02 NOTE — Patient Instructions (Signed)
 Thank you for coming in today! Here is a summary of what we discussed:  -I will send you a message or call you when we have your A1c back  - We will call you to schedule an appointment for your COVID and flu vaccines when we have them in our clinic  - Keep up the good work with exercising and staying busy  - Let us  know if you have any questions or concerns moving forward   Please call the clinic at [phone] if your symptoms worsen or you have any concerns.  Best, Dr Adele

## 2024-03-02 NOTE — Assessment & Plan Note (Signed)
Blood pressure well controlled today. Continue current regimen. 

## 2024-03-02 NOTE — Progress Notes (Signed)
    SUBJECTIVE:   CHIEF COMPLAINT / HPI:   Was told to come in for COVID/Flu vaccines, unfortunately we do not have these vaccines in our clinic.   Had 1 cigarette a few days ago after seeing a snake. Has not had any other tobacco since then. Has been exercising more and keeping busy. Did not have to use nicotine  replacement.  Saw endocrinology in June due to incidental adrenal adenomas. Labs were normal, instructed to follow up in 6 months.   PERTINENT  PMH / PSH: Hypertension, CAD, emphysema, right hip OA, adrenal adenoma, HLD  OBJECTIVE:   BP 136/72   Pulse 68   Wt 205 lb (93 kg)   SpO2 98%   BMI 31.17 kg/m   - General: No acute distress.  Pleasant and conversant. - Eyes: Normal conjunctiva, anicteric. Round symmetric pupils. - ENT: Hearing grossly intact. No nasal discharge. - Respiratory: Respirations are non-labored. - Skin: No visible rashes or ulcers. - Psych: Alert and oriented. Cooperative, Appropriate mood and affect, Normal judgment. - MSK: Normal ambulation. - Neuro: Sensation and CN II-XII grossly normal.   ASSESSMENT/PLAN:   Assessment & Plan Prediabetes A1c 5.5 today.  Will send patient MyChart message History of tobacco use disorder Smoked 1 cigarette since last appointment after being scared by snake.  Cites exercise, staying busy as effective methods for ongoing tobacco cessation Adrenal adenoma, left Evaluated by endocrinology, normal workup.  Instructed to follow-up in 6 months, this will be around December 2025. Essential hypertension Blood pressure well-controlled today.  Continue current regimen. Health care maintenance We will call patient to schedule nurse visit for COVID/flu vaccines once we have them in our office.     Rea Raring, MD [facility]

## 2024-03-17 ENCOUNTER — Telehealth: Admitting: Family

## 2024-03-17 DIAGNOSIS — J439 Emphysema, unspecified: Secondary | ICD-10-CM

## 2024-03-17 MED ORDER — MODERNA COVID-19 BIVALENT 50 MCG/0.5ML IM SUSP: 0.5000 mL | Freq: Once | INTRAMUSCULAR | 0 refills | Status: AC

## 2024-03-17 MED ORDER — COVID-19 MRNA VAC-TRIS(PFIZER) 30 MCG/0.3ML IM SUSY: 0.3000 mL | PREFILLED_SYRINGE | Freq: Once | INTRAMUSCULAR | 0 refills | Status: DC

## 2024-03-17 MED ORDER — COVID-19 MRNA VAC-TRIS(PFIZER) 30 MCG/0.3ML IM SUSY: 0.3000 mL | PREFILLED_SYRINGE | Freq: Once | INTRAMUSCULAR | 0 refills | Status: AC

## 2024-03-17 NOTE — Addendum Note (Signed)
 Addended by: LAVELL LYE A on: 03/17/2024 10:47 AM   Modules accepted: Orders

## 2024-03-17 NOTE — Progress Notes (Signed)
 Hello, I have sent the prescription for your COVID vaccine. Please me know if you have any problems.    Bari Learn, FNP  Approximately 5 minutes was spent documenting and reviewing patient's chart.

## 2024-03-31 ENCOUNTER — Other Ambulatory Visit: Payer: Self-pay | Admitting: Family Medicine

## 2024-04-30 ENCOUNTER — Telehealth: Payer: Self-pay | Admitting: Pharmacist

## 2024-04-30 NOTE — Telephone Encounter (Signed)
 Attempted to contact patient for follow-up of smoking cessation   Left HIPAA compliant voice mail requesting call back to direct phone: [phone]  Total time with patient call and documentation of interaction: 3 minutes.

## 2024-05-17 ENCOUNTER — Telehealth: Payer: Self-pay | Admitting: Pharmacist

## 2024-05-17 NOTE — Telephone Encounter (Signed)
 Attempted to contact patient for follow-up of tobacco cessation update.   Left HIPAA compliant voice mail requesting call back to direct phone: [phone]  Total time with patient call and documentation of interaction: 3 minutes.  Follow-up phone call planned: 2-3 weeks.

## 2024-05-17 NOTE — Telephone Encounter (Signed)
 Patient contacts office to return phone call from earlier to assess smoking cessation  Patient reports that she has quit smoking but has only smoked one cigarette when she was scared by a snake on her front porch. Reported that she is no longer taking Chantix (varenicline) for smoking cessation. Congratulated her on her progress   Total time with patient call and documentation of interaction: 6 minutes.

## 2024-06-18 ENCOUNTER — Encounter: Payer: Self-pay | Admitting: Internal Medicine

## 2024-06-18 ENCOUNTER — Other Ambulatory Visit

## 2024-06-18 ENCOUNTER — Ambulatory Visit: Admitting: Internal Medicine

## 2024-06-18 VITALS — BP 116/70 | Ht 68.0 in | Wt 203.0 lb

## 2024-06-18 DIAGNOSIS — D3502 Benign neoplasm of left adrenal gland: Secondary | ICD-10-CM

## 2024-06-18 NOTE — Progress Notes (Unsigned)
 Name: Lindsey Payne  MRN/ DOB: [id number], [birth year]    Age/ Sex: 73 y.o., female    PCP: Adele Song, MD   Reason for Endocrinology Evaluation: Adrenal adenoma     Date of Initial Endocrinology Evaluation: 12/16/2023    HPI: Lindsey Payne is a 73 y.o. female with a past medical history of HTN. The patient presented for initial endocrinology clinic visit on 12/16/2023 for consultative assistance with her adrenal adenoma.   Patient was noted with bilateral adrenal adenoma on CT imaging 10/2023.  There is a 2.9 cm left adrenal adenoma as well as 1.5 cm right adrenal adenoma.   In reviewing her chart, patient has been noted with normal Aldo/renin ratio 1.3 , aldosterone elevated at 42.8, as well as elevated renin at 33.915  ng/mL/hr, DHEA-S normal 22.5 Cortisol low 5.5 UG/DL  Patient also was noted with the dexamethasone  1 mg tablet prescribed on 10/27/2023 and it appears that a serum cortisol of 5.5 was following dexamethasone  suppression test   She was diagnosed with HTN at age 25 Son with HTN and parents    Patient already on spironolactone  25 mg, which she has been on for years  Patient also had normal androstenedione, 17-OH progesterone and DHEA-S in April, 2025  On her initial visit to our clinic she already had been on spironolactone .  24-hour urinary catecholamines showed norepinephrine elevated at 1119 (upright goal< 1046) , with normal epinephrine at 45 PG/mL, slight elevation in dopamine 65 PG/mL (<27)   Saliva cortisol was normal in June, 2025 Plasma catecholamines (patient was advised to avoid caffeine 24 hours prior to the test) came back with normal epinephrine at 32 PG/mL, normal norepinephrine 558 PG/mL (upright goal< 937) , dopamine slightly elevated at 28 PG/mL (goal<27)    SUBJECTIVE:    Today (06/18/24:  Link Osie Remington is here for follow-up on adrenal adenoma.  She is accompanied by her son Weight stable  BP  well controlled  DM- no No headaches  Anxiety attacks- NO  Cardiac arrhythmias-no but has CAD Palpitations- no Fluid retention- no, she is on furosemide  and spironolactone   No nausea  No constipation or diarrhea   Unfortunately, she lost her 99 year old dog     HISTORY:  Past Medical History:  Past Medical History:  Diagnosis Date   Adenoma of large intestine    Anxiety    Anxiety state 09/08/2006   Arthritis    right hand    Asthma    Back pain    Back pain with R radiculopathy. 12/15/2011   Cataract    CHF (congestive heart failure) (HCC)    Coronary artery disease    Depression    Ganglion cyst    rt wrist   Heart failure with reduced ejection fraction (HCC) 08/15/2019   Hemorrhoids    Hyperlipidemia    Hypertension    Loss of teeth due to extraction 09/29/2016   Lung collapse 1984   left lung, no problems since then   Orthostatic hypotension 03/16/2019   Prediabetes 03/10/2022   Rectal bleeding 01/30/2013   Right shoulder pain 11/26/2020   Sickle cell anemia (HCC)    trait only   Transfusion history    Tubular adenoma    Tubular adenoma of colon 11/06/2020   Appreciated on surgical pathology of patient's 2022 colonoscopy.  Descending colon.     Wears glasses    Past Surgical History:  Past Surgical History:  Procedure Laterality Date   ABDOMINAL HYSTERECTOMY  has one ovary left   BACK SURGERY     3 disc removed   CESAREAN SECTION     1983   COLON SURGERY  1987   benign tubulovillous adenoma   COLONOSCOPY N/A 07/22/2017   Procedure: COLONOSCOPY;  Surgeon: Ethyl Lenis, MD;  Location: [facility];  Service: General;  Laterality: N/A;  NO ANESTHESIA PER DR. ETHYL   COLONOSCOPY  10/2020   MS-MAC-poor prep-TA=repeat in 3 months due to poor prep   CORONARY ANGIOPLASTY  2005   w 2 stents   GANGLION CYST EXCISION Right 10/07/2016   Procedure: Right wrist volar ganglion excision;  Surgeon: Prentice Pagan, MD;  Location: [facility];   Service: Orthopedics;  Laterality: Right;   Heart stints     Dr. Blanca   LUNG SURGERY Left    for collasped lung- in the 1980s   POLYPECTOMY  10/2020   TA x 1   TONSILLECTOMY     TOTAL HIP ARTHROPLASTY Right 05/13/2015   Procedure: RIGHT TOTAL HIP ARTHROPLASTY ANTERIOR APPROACH;  Surgeon: Lonni CINDERELLA Poli, MD;  Location: [facility] OR;  Service: Orthopedics;  Laterality: Right;    Social History:  reports that she has been smoking cigarettes. She started smoking about 65 years ago. She has a 16.5 pack-year smoking history. She has been exposed to tobacco smoke. She has never used smokeless tobacco. She reports current alcohol  use of about 1.0 standard drink of alcohol  per week. She reports that she does not use drugs. Family History: family history includes Breast cancer in her mother; Breast cancer (age of onset: 36) in her sister; Cancer in her father and mother; Cancer (age of onset: 20) in her sister; Colon cancer (age of onset: 28) in her father; Colon polyps (age of onset: 43) in her father; Diabetes in her father; Heart disease in her father, maternal grandfather, and mother; Hypertension in her son; Stroke in her maternal grandmother.   HOME MEDICATIONS: Allergies as of 06/18/2024       Reactions   Valium  [diazepam ] Anxiety        Medication List        Accurate as of June 18, 2024  7:37 AM. If you have any questions, ask your nurse or doctor.          aspirin  EC 81 MG tablet Take 1 tablet (81 mg total) by mouth daily.   diclofenac  Sodium 1 % Gel Commonly known as: Voltaren  Apply 2 g topically 4 (four) times daily as needed (pain).   furosemide  20 MG tablet Commonly known as: LASIX  TAKE 1 TABLET BY MOUTH TWICE  DAILY   hydrALAZINE  50 MG tablet Commonly known as: APRESOLINE  TAKE 1 TABLET BY MOUTH TWICE  DAILY   isosorbide  dinitrate 20 MG tablet Commonly known as: ISORDIL  TAKE 1 TABLET BY MOUTH TWICE  DAILY   lisinopril  40 MG tablet Commonly known as:  ZESTRIL  TAKE 1 TABLET BY MOUTH DAILY   metoprolol  succinate 25 MG 24 hr tablet Commonly known as: TOPROL -XL TAKE 1 TABLET BY MOUTH TWICE  DAILY   nitroGLYCERIN  0.4 MG SL tablet Commonly known as: NITROSTAT  Place 1 tablet (0.4 mg total) under the tongue every 5 (five) minutes as needed.   PARoxetine  40 MG tablet Commonly known as: PAXIL  TAKE 1 TABLET BY MOUTH IN THE  MORNING   potassium chloride  SA 20 MEQ tablet Commonly known as: KLOR-CON  M TAKE 1 TABLET BY MOUTH TWICE  DAILY   rosuvastatin  20 MG tablet Commonly known as: CRESTOR   TAKE 1 TABLET BY MOUTH DAILY   spironolactone  25 MG tablet Commonly known as: ALDACTONE  TAKE 1 TABLET BY MOUTH DAILY          REVIEW OF SYSTEMS: A comprehensive ROS was conducted with the patient and is negative except as per HPI    OBJECTIVE:  VS: BP 116/70   Ht 5' 8 (1.727 m)   Wt 203 lb (92.1 kg)   BMI 30.87 kg/m    Wt Readings from Last 3 Encounters:  06/18/24 203 lb (92.1 kg)  03/02/24 205 lb (93 kg)  02/13/24 203 lb (92.1 kg)     EXAM: General: Pt appears well and is in NAD  Neck: General: Supple without adenopathy. Thyroid : Thyroid  size normal.  No goiter or nodules appreciated.   Lungs: Clear with good BS bilat   Heart: Auscultation: RRR.  Extremities:  BL LE: No pretibial edema   Mental Status: Judgment, insight: Intact Orientation: Oriented to time, place, and person Mood and affect: No depression, anxiety, or agitation     DATA REVIEWED:   Latest Reference Range & Units 12/16/23 08:18 01/02/24 10:25  Epinephrine pg/mL 45 32  Norepinephrine pg/mL 1,119 (H) 558  Dopamine pg/mL 65 (H) 28 (H)  (H): Data is abnormally high    Latest Reference Range & Units 10/31/23 08:56  ALDOSTERONE 0.0 - 30.0 ng/dL 57.1 (H)  Cortisol 6.2 - 19.4 ug/dL 5.5 (L)  DHEA-SO4 79.5 - 186.6 ug/dL 77.4  ALDOS/RENIN RATIO 0.0 - 30.0  1.3    Latest Reference Range & Units 10/31/23 08:56  17-OH Progesterone LCMS ng/dL 11   Androstenedione LCMS 17 - 99 ng/dL 41       CT adrenal abdomen without contrast 11/02/2023   There is a 2.9 cm left adrenal adenoma as well as a 1.5 cm right adrenal adenoma.  Old records , labs and images have been reviewed.   ASSESSMENT/PLAN/RECOMMENDATIONS:   Bilateral adrenal adenomas  -Right adenoma 1.5 cm, left adenoma 2.9 cm. < 10 HFU, mostly benign.  No serial imaging needed at this time - The patient had elevated renin, while on spironolactone , BP is controlled at this time, we did discuss that spironolactone  is medical management for hyperaldosteronism - She did indicate in the past her desire to simplify things and avoid invasive testing - She did have elevated dexamethasone  suppression test with a serum cortisol of 5.5.  Saliva cortisol came back normal June, 2025 - She did have elevated norepinephrine and plasma, repeat testing with holding caffeine 24 hours prior to that normalized norepinephrine - Will repeat today  Follow-up in 6 months  Signed electronically by: Stefano Redgie Butts, MD  [facility] Endocrinology  [facility] [address] [place], [place] [address] Phone: [phone] FAX: [phone]   CC: Adele Song, MD [address] [place] [address] Phone: [phone] Fax: [phone]   Return to Endocrinology clinic as below: Future Appointments  Date Time Provider Department Center  02/14/2025  9:10 AM [facility] ANNUAL FERDIE VISIT [facility]

## 2024-06-24 LAB — CATECHOLAMINES, FRACTIONATED, PLASMA
Dopamine: 30 pg/mL — ABNORMAL HIGH
Epinephrine: 49 pg/mL
Norepinephrine: 480 pg/mL
Total Catecholamines: 559 pg/mL

## 2024-06-25 ENCOUNTER — Ambulatory Visit: Payer: Self-pay | Admitting: Internal Medicine

## 2024-07-07 ENCOUNTER — Other Ambulatory Visit (HOSPITAL_BASED_OUTPATIENT_CLINIC_OR_DEPARTMENT_OTHER): Payer: Self-pay | Admitting: Cardiology

## 2024-07-07 DIAGNOSIS — I10 Essential (primary) hypertension: Secondary | ICD-10-CM

## 2024-07-16 ENCOUNTER — Ambulatory Visit: Payer: Self-pay | Admitting: Family Medicine

## 2024-07-20 ENCOUNTER — Other Ambulatory Visit: Payer: Self-pay | Admitting: Family Medicine

## 2024-07-23 ENCOUNTER — Ambulatory Visit: Admitting: Family Medicine

## 2024-07-23 ENCOUNTER — Encounter: Payer: Self-pay | Admitting: Family Medicine

## 2024-07-23 VITALS — BP 128/72 | HR 68 | Wt 204.6 lb

## 2024-07-23 DIAGNOSIS — Z87891 Personal history of nicotine dependence: Secondary | ICD-10-CM | POA: Diagnosis not present

## 2024-07-23 DIAGNOSIS — D3501 Benign neoplasm of right adrenal gland: Secondary | ICD-10-CM | POA: Diagnosis not present

## 2024-07-23 DIAGNOSIS — F419 Anxiety disorder, unspecified: Secondary | ICD-10-CM

## 2024-07-23 DIAGNOSIS — I10 Essential (primary) hypertension: Secondary | ICD-10-CM

## 2024-07-23 DIAGNOSIS — D3502 Benign neoplasm of left adrenal gland: Secondary | ICD-10-CM | POA: Diagnosis not present

## 2024-07-23 DIAGNOSIS — F32A Depression, unspecified: Secondary | ICD-10-CM

## 2024-07-23 MED ORDER — CITALOPRAM HYDROBROMIDE 40 MG PO TABS: 40.0000 mg | ORAL_TABLET | Freq: Every day | ORAL | 3 refills | Status: DC

## 2024-07-23 MED ORDER — ESCITALOPRAM OXALATE 10 MG PO TABS: 10.0000 mg | ORAL_TABLET | Freq: Every day | ORAL | 0 refills | Status: AC

## 2024-07-23 NOTE — Patient Instructions (Signed)
 Thank you for coming in today! Here is a summary of what we discussed:  -Please schedule a follow up with me in the next 1-2 weeks so we can discuss your smoking and new medicine  Please call the clinic at [phone] if your symptoms worsen or you have any concerns.  Best, Dr Adele
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
## 2024-07-24 ENCOUNTER — Encounter: Payer: Self-pay | Admitting: Family Medicine

## 2024-07-24 DIAGNOSIS — F419 Anxiety disorder, unspecified: Secondary | ICD-10-CM | POA: Insufficient documentation

## 2024-07-24 DIAGNOSIS — F32A Depression, unspecified: Secondary | ICD-10-CM | POA: Insufficient documentation

## 2024-07-24 NOTE — Assessment & Plan Note (Signed)
 Patient currently smoking 2 cigarettes a day, encourage cessation and replacement with other activities that helped her quit in the past such as playing games on her phone and chewing gum.  Can discuss NRT in the future if patient is interested.

## 2024-07-24 NOTE — Assessment & Plan Note (Signed)
 Given patient's age and comorbidities, will switch from Paxil  to Lexapro  given better ADE profile and tolerance in geriatric population.  Will start with Lexapro  10 mg daily.  Advised follow-up in 1-2 weeks to assess efficacy and tolerance of new SSRI.

## 2024-07-24 NOTE — Assessment & Plan Note (Signed)
 Stable and no intervention required per recent endocrinology evaluation.  Will follow-up with endocrinology in 6 months.

## 2024-07-24 NOTE — Assessment & Plan Note (Signed)
 Well controlled today, continue current medication regimen.

## 2024-07-26 ENCOUNTER — Other Ambulatory Visit (HOSPITAL_BASED_OUTPATIENT_CLINIC_OR_DEPARTMENT_OTHER): Payer: Self-pay | Admitting: Cardiology

## 2024-07-26 DIAGNOSIS — I10 Essential (primary) hypertension: Secondary | ICD-10-CM

## 2024-07-29 ENCOUNTER — Other Ambulatory Visit: Payer: Self-pay | Admitting: Family Medicine

## 2024-07-30 NOTE — Telephone Encounter (Signed)
 Left message for patient to call back. She is due for an appointment with Dr. Lonni and per refills needs labs before further meds can be sent in.

## 2024-07-30 NOTE — Telephone Encounter (Signed)
 In accordance with refill protocols, please review and address the following requirements before this medication refill can be authorized:  Labs

## 2024-08-14 ENCOUNTER — Ambulatory Visit: Payer: Self-pay | Admitting: Family Medicine

## 2024-12-17 ENCOUNTER — Ambulatory Visit: Admitting: Internal Medicine

## 2025-02-14 ENCOUNTER — Encounter
